# Patient Record
Sex: Male | Born: 1950 | Race: White | Hispanic: No | Marital: Married | Smoking: Never smoker
Health system: Southern US, Community
[De-identification: ages and names within clinical notes are randomized; demographics above are authoritative.]

## PROBLEM LIST (undated history)

## (undated) DIAGNOSIS — R569 Unspecified convulsions: Secondary | ICD-10-CM

## (undated) DIAGNOSIS — E7800 Pure hypercholesterolemia, unspecified: Secondary | ICD-10-CM

## (undated) DIAGNOSIS — C801 Malignant (primary) neoplasm, unspecified: Secondary | ICD-10-CM

## (undated) DIAGNOSIS — R609 Edema, unspecified: Secondary | ICD-10-CM

## (undated) DIAGNOSIS — R413 Other amnesia: Secondary | ICD-10-CM

## (undated) DIAGNOSIS — E039 Hypothyroidism, unspecified: Secondary | ICD-10-CM

## (undated) DIAGNOSIS — I480 Paroxysmal atrial fibrillation: Secondary | ICD-10-CM

## (undated) DIAGNOSIS — M4712 Other spondylosis with myelopathy, cervical region: Secondary | ICD-10-CM

## (undated) DIAGNOSIS — I4892 Unspecified atrial flutter: Secondary | ICD-10-CM

## (undated) DIAGNOSIS — I10 Essential (primary) hypertension: Secondary | ICD-10-CM

## (undated) DIAGNOSIS — N200 Calculus of kidney: Secondary | ICD-10-CM

## (undated) DIAGNOSIS — M1990 Unspecified osteoarthritis, unspecified site: Secondary | ICD-10-CM

## (undated) DIAGNOSIS — G4733 Obstructive sleep apnea (adult) (pediatric): Secondary | ICD-10-CM

## (undated) DIAGNOSIS — R21 Rash and other nonspecific skin eruption: Secondary | ICD-10-CM

## (undated) DIAGNOSIS — R269 Unspecified abnormalities of gait and mobility: Secondary | ICD-10-CM

## (undated) DIAGNOSIS — I5032 Chronic diastolic (congestive) heart failure: Secondary | ICD-10-CM

## (undated) DIAGNOSIS — R600 Localized edema: Secondary | ICD-10-CM

## (undated) DIAGNOSIS — G959 Disease of spinal cord, unspecified: Secondary | ICD-10-CM

## (undated) HISTORY — DX: Edema, unspecified: R60.9

## (undated) HISTORY — DX: Other spondylosis with myelopathy, cervical region: M47.12

## (undated) HISTORY — DX: Localized edema: R60.0

## (undated) HISTORY — DX: Paroxysmal atrial fibrillation: I48.0

## (undated) HISTORY — DX: Unspecified abnormalities of gait and mobility: R26.9

## (undated) HISTORY — DX: Other amnesia: R41.3

## (undated) HISTORY — DX: Disease of spinal cord, unspecified: G95.9

## (undated) HISTORY — PX: RETINAL DETACHMENT SURGERY: SHX105

## (undated) HISTORY — DX: Unspecified atrial flutter: I48.92

## (undated) HISTORY — PX: EYE SURGERY: SHX253

---
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
## 1989-07-23 HISTORY — PX: CATARACT EXTRACTION W/ INTRAOCULAR LENS  IMPLANT, BILATERAL: SHX1307

## 1998-12-08 ENCOUNTER — Emergency Department (HOSPITAL_COMMUNITY): Admission: EM | Admit: 1998-12-08 | Discharge: 1998-12-08 | Payer: Self-pay | Admitting: Emergency Medicine

## 1999-02-26 ENCOUNTER — Ambulatory Visit: Admission: RE | Admit: 1999-02-26 | Discharge: 1999-02-26 | Payer: Self-pay | Admitting: *Deleted

## 1999-03-16 ENCOUNTER — Emergency Department (HOSPITAL_COMMUNITY): Admission: EM | Admit: 1999-03-16 | Discharge: 1999-03-16 | Payer: Self-pay | Admitting: Emergency Medicine

## 1999-04-03 ENCOUNTER — Emergency Department (HOSPITAL_COMMUNITY): Admission: EM | Admit: 1999-04-03 | Discharge: 1999-04-03 | Payer: Self-pay | Admitting: Emergency Medicine

## 1999-04-25 ENCOUNTER — Inpatient Hospital Stay (HOSPITAL_COMMUNITY): Admission: EM | Admit: 1999-04-25 | Discharge: 1999-04-27 | Payer: Self-pay | Admitting: Internal Medicine

## 1999-07-29 ENCOUNTER — Emergency Department (HOSPITAL_COMMUNITY): Admission: EM | Admit: 1999-07-29 | Discharge: 1999-07-29 | Payer: Self-pay | Admitting: Emergency Medicine

## 1999-08-21 ENCOUNTER — Emergency Department (HOSPITAL_COMMUNITY): Admission: EM | Admit: 1999-08-21 | Discharge: 1999-08-21 | Payer: Self-pay | Admitting: *Deleted

## 1999-10-14 ENCOUNTER — Encounter: Payer: Self-pay | Admitting: Ophthalmology

## 1999-10-16 ENCOUNTER — Ambulatory Visit (HOSPITAL_COMMUNITY): Admission: RE | Admit: 1999-10-16 | Discharge: 1999-10-17 | Payer: Self-pay | Admitting: Ophthalmology

## 2000-07-29 ENCOUNTER — Ambulatory Visit (HOSPITAL_COMMUNITY): Admission: RE | Admit: 2000-07-29 | Discharge: 2000-07-29 | Payer: Self-pay | Admitting: Neurology

## 2000-11-22 ENCOUNTER — Emergency Department (HOSPITAL_COMMUNITY): Admission: EM | Admit: 2000-11-22 | Discharge: 2000-11-22 | Payer: Self-pay | Admitting: Emergency Medicine

## 2001-03-01 ENCOUNTER — Encounter: Payer: Self-pay | Admitting: Emergency Medicine

## 2001-03-01 ENCOUNTER — Emergency Department (HOSPITAL_COMMUNITY): Admission: EM | Admit: 2001-03-01 | Discharge: 2001-03-01 | Payer: Self-pay | Admitting: Emergency Medicine

## 2001-03-23 ENCOUNTER — Emergency Department (HOSPITAL_COMMUNITY): Admission: EM | Admit: 2001-03-23 | Discharge: 2001-03-23 | Payer: Self-pay

## 2001-04-07 ENCOUNTER — Inpatient Hospital Stay (HOSPITAL_COMMUNITY): Admission: EM | Admit: 2001-04-07 | Discharge: 2001-04-08 | Payer: Self-pay | Admitting: Emergency Medicine

## 2001-05-02 ENCOUNTER — Ambulatory Visit (HOSPITAL_COMMUNITY): Admission: RE | Admit: 2001-05-02 | Discharge: 2001-05-02 | Payer: Self-pay | Admitting: Cardiology

## 2001-05-02 ENCOUNTER — Encounter: Payer: Self-pay | Admitting: Cardiology

## 2001-05-11 ENCOUNTER — Emergency Department (HOSPITAL_COMMUNITY): Admission: EM | Admit: 2001-05-11 | Discharge: 2001-05-11 | Payer: Self-pay | Admitting: Emergency Medicine

## 2001-05-22 ENCOUNTER — Ambulatory Visit (HOSPITAL_BASED_OUTPATIENT_CLINIC_OR_DEPARTMENT_OTHER): Admission: RE | Admit: 2001-05-22 | Discharge: 2001-05-22 | Payer: Self-pay | Admitting: Pulmonary Disease

## 2001-08-29 ENCOUNTER — Emergency Department (HOSPITAL_COMMUNITY): Admission: EM | Admit: 2001-08-29 | Discharge: 2001-08-29 | Payer: Self-pay | Admitting: Emergency Medicine

## 2002-01-24 ENCOUNTER — Ambulatory Visit (HOSPITAL_BASED_OUTPATIENT_CLINIC_OR_DEPARTMENT_OTHER): Admission: RE | Admit: 2002-01-24 | Discharge: 2002-01-24 | Payer: Self-pay | Admitting: Pulmonary Disease

## 2002-02-15 ENCOUNTER — Ambulatory Visit (HOSPITAL_BASED_OUTPATIENT_CLINIC_OR_DEPARTMENT_OTHER): Admission: RE | Admit: 2002-02-15 | Discharge: 2002-02-15 | Payer: Self-pay | Admitting: Plastic Surgery

## 2002-02-15 ENCOUNTER — Encounter (INDEPENDENT_AMBULATORY_CARE_PROVIDER_SITE_OTHER): Payer: Self-pay | Admitting: Specialist

## 2003-04-06 ENCOUNTER — Emergency Department (HOSPITAL_COMMUNITY): Admission: EM | Admit: 2003-04-06 | Discharge: 2003-04-06 | Payer: Self-pay

## 2003-08-23 ENCOUNTER — Ambulatory Visit (HOSPITAL_COMMUNITY): Admission: RE | Admit: 2003-08-23 | Discharge: 2003-08-23 | Payer: Self-pay | Admitting: Gastroenterology

## 2004-07-15 ENCOUNTER — Ambulatory Visit (HOSPITAL_BASED_OUTPATIENT_CLINIC_OR_DEPARTMENT_OTHER): Admission: RE | Admit: 2004-07-15 | Discharge: 2004-07-15 | Payer: Self-pay | Admitting: Family Medicine

## 2004-08-14 ENCOUNTER — Ambulatory Visit (HOSPITAL_BASED_OUTPATIENT_CLINIC_OR_DEPARTMENT_OTHER): Admission: RE | Admit: 2004-08-14 | Discharge: 2004-08-14 | Payer: Self-pay | Admitting: Family Medicine

## 2008-05-13 ENCOUNTER — Emergency Department (HOSPITAL_COMMUNITY): Admission: EM | Admit: 2008-05-13 | Discharge: 2008-05-13 | Payer: Self-pay | Admitting: Emergency Medicine

## 2009-05-08 ENCOUNTER — Emergency Department (HOSPITAL_COMMUNITY): Admission: EM | Admit: 2009-05-08 | Discharge: 2009-05-08 | Payer: Self-pay | Admitting: Emergency Medicine

## 2009-06-26 ENCOUNTER — Ambulatory Visit (HOSPITAL_COMMUNITY): Admission: RE | Admit: 2009-06-26 | Discharge: 2009-06-26 | Payer: Self-pay | Admitting: Orthopedic Surgery

## 2009-07-29 ENCOUNTER — Encounter: Admission: RE | Admit: 2009-07-29 | Discharge: 2009-07-29 | Payer: Self-pay | Admitting: Neurosurgery

## 2009-08-11 ENCOUNTER — Encounter: Admission: RE | Admit: 2009-08-11 | Discharge: 2009-08-11 | Payer: Self-pay | Admitting: Neurosurgery

## 2009-10-02 ENCOUNTER — Encounter: Admission: RE | Admit: 2009-10-02 | Discharge: 2009-11-20 | Payer: Self-pay | Admitting: Neurosurgery

## 2010-10-07 ENCOUNTER — Encounter: Admission: RE | Admit: 2010-10-07 | Discharge: 2010-10-07 | Payer: Self-pay | Admitting: Neurology

## 2010-12-13 ENCOUNTER — Encounter: Payer: Self-pay | Admitting: Orthopedic Surgery

## 2011-03-18 ENCOUNTER — Other Ambulatory Visit (HOSPITAL_COMMUNITY): Payer: Self-pay | Admitting: Neurology

## 2011-03-18 DIAGNOSIS — M47812 Spondylosis without myelopathy or radiculopathy, cervical region: Secondary | ICD-10-CM

## 2011-03-23 ENCOUNTER — Ambulatory Visit (HOSPITAL_COMMUNITY)
Admission: RE | Admit: 2011-03-23 | Discharge: 2011-03-23 | Disposition: A | Discharge status: Home / Self Care | Payer: 59 | Source: Ambulatory Visit | Attending: Neurology | Admitting: Neurology

## 2011-03-23 DIAGNOSIS — M47812 Spondylosis without myelopathy or radiculopathy, cervical region: Secondary | ICD-10-CM | POA: Insufficient documentation

## 2011-03-23 DIAGNOSIS — M79609 Pain in unspecified limb: Secondary | ICD-10-CM | POA: Insufficient documentation

## 2011-03-23 DIAGNOSIS — Q762 Congenital spondylolisthesis: Secondary | ICD-10-CM | POA: Insufficient documentation

## 2011-03-23 DIAGNOSIS — M5020 Other cervical disc displacement, unspecified cervical region: Secondary | ICD-10-CM | POA: Insufficient documentation

## 2011-03-23 DIAGNOSIS — R209 Unspecified disturbances of skin sensation: Secondary | ICD-10-CM | POA: Insufficient documentation

## 2011-03-23 DIAGNOSIS — M129 Arthropathy, unspecified: Secondary | ICD-10-CM | POA: Insufficient documentation

## 2011-03-23 DIAGNOSIS — M542 Cervicalgia: Secondary | ICD-10-CM | POA: Insufficient documentation

## 2011-03-23 HISTORY — PX: CERVICAL DISCECTOMY: SHX98

## 2011-03-30 ENCOUNTER — Encounter (HOSPITAL_COMMUNITY)
Admission: RE | Admit: 2011-03-30 | Discharge: 2011-03-30 | Disposition: A | Discharge status: Home / Self Care | Payer: 59 | Source: Ambulatory Visit | Attending: Neurosurgery | Admitting: Neurosurgery

## 2011-03-30 ENCOUNTER — Ambulatory Visit (HOSPITAL_COMMUNITY)
Admission: RE | Admit: 2011-03-30 | Discharge: 2011-03-30 | Disposition: A | Discharge status: Home / Self Care | Payer: 59 | Source: Ambulatory Visit | Attending: Neurosurgery | Admitting: Neurosurgery

## 2011-03-30 ENCOUNTER — Other Ambulatory Visit (HOSPITAL_COMMUNITY): Payer: Self-pay | Admitting: Neurosurgery

## 2011-03-30 DIAGNOSIS — M4802 Spinal stenosis, cervical region: Secondary | ICD-10-CM

## 2011-03-30 DIAGNOSIS — Z01812 Encounter for preprocedural laboratory examination: Secondary | ICD-10-CM | POA: Insufficient documentation

## 2011-03-30 DIAGNOSIS — Z01811 Encounter for preprocedural respiratory examination: Secondary | ICD-10-CM | POA: Insufficient documentation

## 2011-03-30 DIAGNOSIS — Z01810 Encounter for preprocedural cardiovascular examination: Secondary | ICD-10-CM | POA: Insufficient documentation

## 2011-03-30 DIAGNOSIS — Z01818 Encounter for other preprocedural examination: Secondary | ICD-10-CM | POA: Insufficient documentation

## 2011-03-30 DIAGNOSIS — I10 Essential (primary) hypertension: Secondary | ICD-10-CM | POA: Insufficient documentation

## 2011-03-30 DIAGNOSIS — G4730 Sleep apnea, unspecified: Secondary | ICD-10-CM | POA: Insufficient documentation

## 2011-03-30 LAB — CBC
HCT: 41.9 % (ref 39.0–52.0)
Hemoglobin: 14.1 g/dL (ref 13.0–17.0)
MCH: 30.1 pg (ref 26.0–34.0)
MCHC: 33.7 g/dL (ref 30.0–36.0)
MCV: 89.3 fL (ref 78.0–100.0)
Platelets: 172 10*3/uL (ref 150–400)
RBC: 4.69 MIL/uL (ref 4.22–5.81)
RDW: 13.4 % (ref 11.5–15.5)
WBC: 4.3 10*3/uL (ref 4.0–10.5)

## 2011-03-30 LAB — BASIC METABOLIC PANEL
BUN: 10 mg/dL (ref 6–23)
CO2: 30 mEq/L (ref 19–32)
Calcium: 10 mg/dL (ref 8.4–10.5)
Chloride: 103 mEq/L (ref 96–112)
Creatinine, Ser: 0.6 mg/dL (ref 0.4–1.5)
GFR calc Af Amer: >60 mL/min (ref >60)
GFR calc non Af Amer: >60 mL/min (ref >60)
Glucose, Bld: 80 mg/dL (ref 70–99)
Potassium: 4 mEq/L (ref 3.5–5.1)
Sodium: 140 mEq/L (ref 135–145)

## 2011-03-30 LAB — DIFFERENTIAL
Basophils Absolute: 0 10*3/uL (ref 0.0–0.1)
Basophils Relative: 1 % (ref 0–1)
Eosinophils Absolute: 0.2 10*3/uL (ref 0.0–0.7)
Eosinophils Relative: 4 % (ref 0–5)
Lymphocytes Relative: 33 % (ref 12–46)
Lymphs Abs: 1.4 10*3/uL (ref 0.7–4.0)
Monocytes Absolute: 0.6 10*3/uL (ref 0.1–1.0)
Monocytes Relative: 13 % — ABNORMAL HIGH (ref 3–12)
Neutro Abs: 2.1 10*3/uL (ref 1.7–7.7)
Neutrophils Relative %: 49 % (ref 43–77)

## 2011-03-30 LAB — SURGICAL PCR SCREEN
MRSA, PCR: NEGATIVE
Staphylococcus aureus: NEGATIVE

## 2011-03-31 ENCOUNTER — Observation Stay (HOSPITAL_COMMUNITY)
Admission: RE | Admit: 2011-03-31 | Discharge: 2011-04-02 | Disposition: A | Discharge status: Home / Self Care | Payer: 59 | Source: Ambulatory Visit | Attending: Neurosurgery | Admitting: Neurosurgery

## 2011-03-31 ENCOUNTER — Inpatient Hospital Stay (HOSPITAL_COMMUNITY): Payer: 59

## 2011-03-31 DIAGNOSIS — G8250 Quadriplegia, unspecified: Secondary | ICD-10-CM | POA: Insufficient documentation

## 2011-03-31 DIAGNOSIS — M4712 Other spondylosis with myelopathy, cervical region: Principal | ICD-10-CM | POA: Insufficient documentation

## 2011-03-31 DIAGNOSIS — Z01812 Encounter for preprocedural laboratory examination: Secondary | ICD-10-CM | POA: Insufficient documentation

## 2011-03-31 DIAGNOSIS — Z01810 Encounter for preprocedural cardiovascular examination: Secondary | ICD-10-CM | POA: Insufficient documentation

## 2011-03-31 DIAGNOSIS — Z01818 Encounter for other preprocedural examination: Secondary | ICD-10-CM | POA: Insufficient documentation

## 2011-04-03 NOTE — Op Note (Signed)
[name], [name] NO.:  [id number]  MEDICAL RECORD NO.:  [id number]           PATIENT TYPE:  O  LOCATION:  3523                         FACILITY:  [facility]  PHYSICIAN:  Hewitt Shorts, M.D.DATE OF BIRTH:  1951/10/04  DATE OF PROCEDURE: DATE OF DISCHARGE:                              OPERATIVE REPORT   PREOPERATIVE DIAGNOSES:  Cervical stenosis, cervical myelopathy, quadriparesis, and cervical dynamic instability.  POSTOPERATIVE DIAGNOSES:  Cervical stenosis, cervical myelopathy, quadriparesis, and cervical dynamic instability.  PROCEDURES:  C3-C5 posterior cervical arthrodesis with lateral mass screws and rods, and locally harvested morselized autograft and morselized allograft, and C3-C5 cervical laminectomy with microdissection, microsurgical technique with operating microscope.  SURGEON:  Hewitt Shorts, MD  ASSISTANT:  Lovell Sheehan.  ANESTHESIA:  General endotracheal.  INDICATIONS:  The patient is a 60 year old man with history of lumbar stenosis, seizure disorder, who developed a progressive spastic quadriparesis, was found to have cervical stenosis at C3-4, C4-5 levels, primarily due to a retrolisthesis C3 and C4 and in fact the listhesis was dynamic to flexion and extension.  Compression was primarily from the posterior elements of C4, and therefore it was elected to proceed with a posterior decompression and stabilization with the understanding that possible need for eventual anterior cervical diskectomy and arthrodesis.  DESCRIPTION OF PROCEDURE:  The patient was brought to the operating room and placed under general endotracheal anesthesia.  The patient was placed in a 3-pin Mayfield head holder and turned to a prone position. The posterior aspect of the head and neck and upper back were prepped with Betadine soap and solution, and draped in a sterile fashion.  The midline was infiltrated with local anesthetic with epinephrine, and  a midline incision was made, carried down through the subcutaneous tissue. Bipolar electrocautery was used to maintain hemostasis.  Dissection was carried down to the cervical fascia, which was incised in the midline, and the paracervical musculature was dissected from the spinous process and lamina in subperiosteal fashion.  The spinous process C2, C3, C4, and C5 were exposed along with the lamina.  The C-arm fluoroscope was draped and brought in the field, and the localization confirmed. Dissection was then carried out laterally over the facet complexes.  It was advanced to facet arthropathy, C3-4 worse than C4-5.  Using a C-arm fluoroscope, we identified entry points for the lateral masses, selecting an inferomedial to superolateral approach.  Each screw hole was drilled, examined with a ball probe.  Good bony surfaces were found. Each screw hole was tapped.  We placed a 3.5 x 12-mm screws bilaterally at C5 and C4, and on the right side at C3 and a 14-mm screw on the left- sided C3.  Once the screws were all in place, we cut rods to a proper length.  The rod on the left had a gentle lordosis placed, and then we placed six locking caps.  Once the locking caps were in place, final tightening was done.  The system used was Viewpoint from [employer].  The operating microscope was then draped and brought in the field, and we proceeded with the decompression using microdissection microsurgical technique.  Laminectomy  was performed.  The entire spinous process and lamina of C3 and C4 and the superior lamina and spinous process C5.  The spinous process and lamina were harvested for bone grafting used as a locally harvested morselized autograft later during the arthrodesis.  We then thinned the remaining lamina with the high-speed drill and the remaining lamina was then removed using a 3-mm Kerrison punch with a thin footplate.  Ligamentum flavum was carefully removed and we decompressed the  thecal sac from the C2-3 interlaminar space to the upper portion of C5.  The decompression extended laterally with good decompression of thecal sac was achieved.  Edges of the bone were waxed as needed with bone wax and good hemostasis was established confirmed. We then packed the lateral masses with combination of locally harvested morselized autograft and Vitoss.  The surfaces have been previously decorticated with high-speed drill.  We then proceeded with closure.  The paraspinal muscles were approximated with interrupted undyed #1 Vicryl sutures, deep fascia with interrupted undyed #1 Vicryl sutures, Scarpa fascia closed with interrupted inverted undyed #1 Vicryl and 2-0 undyed Vicryl sutures, and the skin was approximated with interrupted inverted 2-0 Vicryl sutures along with Dermabond.  Once the Dermabond was dry, sterile gauze was applied and 4-inch Hypafix.  Procedure was tolerated well.  Following the procedure, the patient was turned back to the supine position, the 3- pin Mayfield head holder was removed.  An Aspen cervical collar was applied and the patient was to be reversed from the anesthetic, extubated, and transferred to the recovery room for further care. Estimated blood loss was 100 mL.  Sponge count correct.     Hewitt Shorts, M.D.     RWN/MEDQ  D:  03/31/2011  T:  04/01/2011  Job:  [id number]  Electronically Signed by Shirlean Kelly M.D. on 04/03/2011 11:03:56 AM

## 2011-04-09 NOTE — H&P (Signed)
[place]  Patient:    [name], [name]                    MRN: [id number] Adm. Date:  [id number] Disc. Date: [id number] Attending:  Anastasio Auerbach CC:         Henrine Screws, M.D., [facility]  Marlan Palau, M.D.  Gerrit Friends. Dietrich Pates, M.D. [facility]   History and Physical  DATE OF BIRTH: November 24, 1950  PROBLEM LIST:  1. Signs and symptoms complex.     a. Diaphoresis, light headedness, mid abdominal fluttering feeling        secondary to hypoglycemia.  2. Lower extremity edema, questionable right-sided heart failure.  3. Uncontrolled hypertension.  4. Possible obesity sleep apnea.  5. Recent history of near syncope, with similar symptoms to problem #1,     with abnormal CK-MB.  6. Seizure disorder, with multiple emergency room visits associated with     this problem.  7. Obesity.  8. History of right eye retinal detachment x 4, last in 2000.  CHIEF COMPLAINT: Light headedness, diaphoresis.  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 The patient was working when these symptoms started.  The patient denies shortness of breath or chest pain.  He had similar symptoms, with almost a near syncopal episode about two weeks ago.  He was evaluated then in the emergency department.  He had an unchanged EKG without evidence of acute findings.  His CK-MBs were abnormal, with CK of 131 and MB of 8.6 - which is about 6.3% of fraction percentage.  A glucose level was normal.  Oxygen saturation was 91% on room air at that time.  Today when the patient developed symptoms an Accu-Chek was 49.  He was transferred to the emergency department, where he received glucose tablets on two  occasions.  The CBGs improved up to 80 and then 105.  The symptoms resolved with resolution of this hypoglycemia.  The patient denies fever, chills, recent upper respiratory symptoms, orthopnea, melena, tarry stools, bright red blood per rectum, skin rash, headaches, palpitations, skipped beats feeling, focal weakness, seizure activity, bowel or urine incontinence.  He describes chronic lower extremity edema.  He describes a history of elevated blood pressure, treated with two antihypertensive agents.  Dr. Janey Greaser stopped these two medications about five years ago.  The patient denies swallowing problems, focal weakness, amaurosis fugax, slurred speech, urinary symptoms, diarrhea, constipation, nausea or vomiting.  Mr. Tursi describes how this morning he ate a little amount of breakfast as well as for lunch and ate a fare of the usual amount of food.  He instead drank several cups of coffee due to stress at work.  PAST MEDICAL HISTORY: As per problem list.  ALLERGIES: None.  CURRENT MEDICATIONS:  1. Dilantin 300 mg in the morning, 350 mg in the afternoon.  2. Phenobarbital 120 mg p.o. q.d.  3. Multivitamin one tablet p.o. q.d.  SOCIAL HISTORY: The patient is married, no children.  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  He does not drink alcohol, he does not smoke, he does not use illegal drugs.  FAMILY HISTORY: His grandmother had diabetes.  His mother had hypertension. His mother and grandmother had strokes.  His mother had an MI in the late 26s. His father had a laryngeal cancer as well as a left eye cancer that required enucleation of the left eye.  His father also had a melanoma removed.  ALLERGIES: None.  REVIEW OF SYSTEMS: As per HPI.  PHYSICAL EXAMINATION:  VITAL SIGNS: Temperature 99.1 degrees, blood pressure 154/86, heart rate 93, respirations 20.  Oxygen saturation 95% on room air.  HEENT: Head normocephalic,  atraumatic.  Sclerae nonicteric.  Right conjunctiva slightly injected - this has been a chronic finding.  PERRLA.  EOMI. Funduscopic examination shows no definitive papilledema or hemorrhages. TMs within normal limits.  Moist mucous membranes.  Oropharynx clear.  NECK: Supple.  No JVD, no bruits, no adenopathy, not thyromegaly.  LUNGS: Clear to auscultation bilaterally without crackles, wheezes.  Fair air movement bilaterally.  CARDIAC: Regular rate and rhythm without murmurs, rubs, or gallops.  Normal S1 and S2.  ABDOMEN: Nontender, nondistended, obese; no hepatosplenomegaly; bowel sounds present; no masses, no bruits, no rebound or guarding.  GU: Examination within normal limits.  RECTAL: Examination not done.  EXTREMITIES: Pitting edema 2+ bilaterally up to the mid tibial shafts.  Pulses 1+ bilaterally.  No clubbing or cyanosis.  NEUROLOGIC: Alert and oriented x 3.  Cranial nerves 2-12 intact.  Strength 5/5 in all extremities.  DTRs 3/5 in all extremities.  Sensory intact.  Plantar reflexes downgoing bilaterally.  LABORATORY DATA: Urine drug screen negative.  Dilantin level 14.4, phenobarbital level 16.2.  Hemoglobin 15.3, MCV 89; WBC 7.1; platelets 216,000.  Sodium 138, potassium 4.2, chloride 108, CO2 23, BUN 9, creatinine 0.8, glucose 104 after glucose tablets.  LFTs within normal limits except for alkaline phosphatase up to 137.  Troponin I 0.03.  CK 63, MB 3.7.  EKG shows normal sinus rhythm with normal axis, no significant ST segment changes; R waves in V2 lost compared to previous EKG on Mar 23, 2001; otherwise, no significant changes.  ASSESSMENT/PLAN:  1. Signs and symptoms complex (as described in problem list).  Hypoglycemia     seems to be the most likely etiology to explain the patients diaphoresis     and light headedness associated with fluttering feeling of the mid      abdomen.  The symptoms have resolved completely with glucose tablets.     Despite  slight electrocardiogram change since Mar 23, 2001, the patient     denies any chest pain, shortness of breath, orthopnea, or dyspnea on     exertion.  Will admit the patient to a telemetry bed.  Accu-Cheks will be     monitored closely.  Given that no hypoglycemia had been noticed on     multiple emergency room visits or admissions, secondary etiology like     insulin normal is very unlikely.  I think that the fact that the patient     did not eat well for breakfast and lunch today, associated with stress     related to work load, hypoglycemia took place today.  Once again, we will     monitor the patients CBGs closely and decide about further work-up.  2. Lower extremity edema.  The patient describes this lower extremity     swelling for many years.  Given that he describes clear symptoms of sleep     apnea, cor pulmonale is possible.  For now we will monitor overnight the     pulse oximetry and determine if he requires nocturnal home oxygen     supplementation.  A  2D echocardiogram will be obtained either during this     hospital stay or at the [facility] office to evaluate right-sided     pressures.  3. Uncontrolled hypertension.  As described in the History of Present     Illness, two antihypertensive agents were discontinued about five years     ago.  Hypertension is present currently.  Will start on an ACE inhibitor     and a beta-blocker for heart protection, and will follow the patients     blood pressure response.  4. Abnormal CK-MB two weeks ago, associated with electrocardiogram change.     As described in the History of Present Illness, two weeks ago the patient     had abnormal cardiac enzymes.  Troponin I was not obtained at that time.     Today cardiac enzymes are completely negative.  Electrocardiogram compared     to two weeks ago shows a loss of R wave in V2.  Questionable Q wave could     be present in V1, V2 through there is a small R wave defection.  If Q  wave     were to be confirmed an anterior myocardial infarction could have taken     place two weeks ago.  For this reason, the patient is being admitted to a     telemetry bed.  Electrocardiogram series as well as cardiac enzyme series     will be obtained.  Aspirin and beta-blocker will be started for heart     protection.  Dr. Dietrich Pates will see the patient in consultation.  If the     patient is discharged home over the weekend a Cardiolite will be arranged     at the [facility] office.  If Dr. Dietrich Pates decides that this     patient requires inpatient work-up, then the patient will be kept until     Monday for further cardiac evaluation.  Also, a 2D echocardiogram should     help to evaluate the patients left ventricular function.  If, indeed, an     anterior wall myocardial infarction were to occur the left ventricle wall     should be effected by this questionable event.  5. Possible obesity sleep apnea.  The patient describes falling asleep easily     at work.  His wife has described heavy snoring, with times in which he     stops breathing.  These clinical symptoms associated with clinical obesity     and thick neck are suspicious for obesity sleep apnea.  Prior to discharge     an outpatient sleep study will be obtained.  As described in problem #2,     nocturnal pulse oximetry will be monitored. DD:  04/07/01 TD:  04/09/01 Job: [id number]

## 2011-04-09 NOTE — Procedures (Signed)
NAMEBRODYN, DEPUY              ACCOUNT NO.:  [id number]   MEDICAL RECORD NO.:  [id number]          PATIENT TYPE:  OUT   LOCATION:  SLEEP CENTER                 FACILITY:  [facility]   PHYSICIAN:  Clinton D. Maple Hudson, M.D. DATE OF BIRTH:  [birth year]   DATE OF STUDY:  08/14/2004  DATE OF DISCHARGE:  08/14/2004                              NOCTURNAL POLYSOMNOGRAM   REFERRING PHYSICIAN:  Dr. Henrine Screws   INDICATION FOR THE STUDY:  Hypersomnia with sleep apnea.  Diagnostic NPSG on  July 15, 2004 recorded an RDI of 11.7/hr with desaturation to 76%.  He is  currently using home CPAP at 12 CWP.   EPWORTH SLEEPINESS SCORE:  10/24   NECK SIZE:  17-1/2 inches   BODY MASS INDEX:  43   WEIGHT:  322 pounds   MEDICATIONS:  Dilantin, phenobarbital, Synthroid, metoprolol, aspirin,  Lotensin, Lipitor, Relafen.   SLEEP ARCHITECTURE:  Total sleep time 337 minutes with sleep efficiency 79%,  stage I was 5%, stage II 36%, stages III and IV 30%, REM was 28% of total  sleep time.  Latency to sleep onset 34 minutes, REM latency 210 minutes.  Awake after sleep onset 56 minutes.  Arousal index 9.8.   RESPIRATORY DATA:  CPAP titration protocol.  CPAP was titrated to 11 CWP RDI  0.5/hr using a full face ComfortGel Mask, size not specified.  His own mask  from home was a [facility] but technician noted oral venting with this  nasal mask and changed to a full face mask.   OXYGEN DATA:  Moderate snoring before CPAP control with a nadir of 85%,  improved to 94% or better after CPAP control on room air.   CARDIAC DATA:  Normal sinus rhythm.   MOVEMENT/PARASOMNIA:  A total of 362 limb jerks were recorded of which 7  were associated with arousal or awakening for a periodic limb movement with  arousal index of 1.2/hr which is probably not significant.   IMPRESSION/RECOMMENDATION:  Successful continuous positive airway pressure  titration to 11 CWP, respiratory disturbance index 0.5/hr using a full  face  ComfortGel Mask.  His home continuous positive airway  pressure of 12 CWP would be acceptable if he is comfortable with it but  would lead to increased oral venting with his current nasal mask.      CDY/MEDQ  D:  08/23/2004 12:29:50  T:  08/24/2004 08:06:17  Job:  [id number]

## 2011-04-09 NOTE — Procedures (Signed)
NAME:  [name], [name]           ACCOUNT NO.:  [id number]   MEDICAL RECORD NO.:  [id number]          PATIENT TYPE:  OUT   LOCATION:  SLEEP CENTER                 [facility]ACILITY:  [facility]   PHYSICIAN:  Clinton D. Maple Hudson, M.D. DATE OF BIRTH:  17-Mar-1951   DATE OF ADMISSION:  07/15/2004  DATE OF DISCHARGE:                              NOCTURNAL POLYSOMNOGRAM   REFERRING PHYSICIAN:  Dr. Henrine Screws   INDICATION FOR STUDY:  Hypersomnia with sleep apnea.  Prior diagnosis of  obstructive sleep apnea.  Intermittent home use of CPAP at 12 CWP reported.  The patient was scheduled for a split study protocol on this night.   MEDICATION LIST:  Dilantin, phenobarbital, Lipitor, aspirin, metoprolol,  Synthroid, Lotensin.   MEDICAL PROBLEM LIST:  Detached retinas, epilepsy, asthma, obesity,  hypertension, hyperlipidemia, osteoarthritis.   EPWORTH SLEEPINESS SCORE:  14/24.   BMI 42.  Weight 321 pounds.   SLEEP ARCHITECTURE:  Total sleep time 374 minutes with sleep efficiency 79%.  Stage I was 4%, Stage II 63%, Stages III and IV were absent, REM was 11% of  total sleep time. Latency to sleep onset 17 minutes, latency to REM 339  minutes, awake after sleep onset 85 minutes, arousal index 38/hr.   RESPIRATORY DATA:  Mild obstructive sleep apnea/hypopnea syndrome, RDI  11.7/hr reflecting 73 hypopneas.  Events were not positional. REM RDI was  35/hr. He did not qualify for split study protocol because of difficulty  maintaining sleep and infrequent events during the first half of the night,  not allowing sufficient time for CPAP titration.   OXYGEN DATA:  Moderate to loud snoring with moderate oxygen desaturation to  a nadir of 76% with apneas.  Baseline oxygen saturation on room air at rest  while awake was 94%.   CARDIAC DATA:  Normal cardiac rhythm.   MOVEMENT/PARASOMNIA:  Frequent limb jerks, a total of 287 recorded of which  60 were associated with arousal or awakening for a periodic limb  movement  with arousal index of 9.6/hr.  He did awaken complaining of leg pain.   IMPRESSION/RECOMMENDATION:  Moderately severe obstructive sleep  apnea/hypopnea syndrome, respiratory disturbance index 11.7/hr with moderate  oxygen desaturation to 78%.  CPAP titration was not attempted by split study  protocol because of insufficient events early in the study, to allow  adequate time.  If CPAP titration is specifically required, he can return  for this.  There is history of epilepsy, but no epileptiform activity was  noted on EEG on this study.  Periodic limb movement syndrome, moderate, 9.6  arousals per hour may justify consideration of additional therapy with  clonazepam, Requip, or alternative if clinically appropriate.                                   ______________________________                                Rennis Chris. Maple Hudson, M.D.  Diplomate, American Board of Sleep Medicine    CDY/MEDQ  D:  07/19/2004 12:00:02  T:  07/20/2004 13:18:56  Job:  [id number]

## 2011-07-06 IMAGING — CT CT L SPINE W/O CM
3 series · 16 of 33 positions shown, 19 images · non-contrast
Comparison: None

CLINICAL DATA: Low back pain and bilateral leg pain, left greater
than right.

CT LUMBAR SPINE WITHOUT CONTRAST
TECHNIQUE: Multidetector CT imaging of the lumbar spine was
performed without intravenous contrast administration. Multiplanar
CT image reconstructions were also generated.

[Series 4: 2mm axial soft tissue · axial · 0.37mm/px · z∈[-190,-10]mm · 8 of 108 slices shown, 10 images]
[im 9/108  soft-tissue]
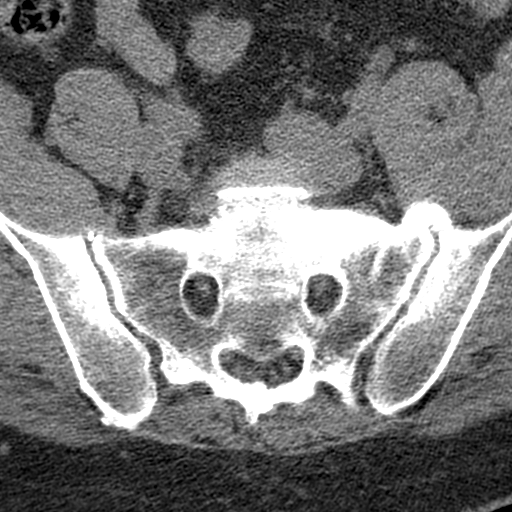
[im 9/108  bone]
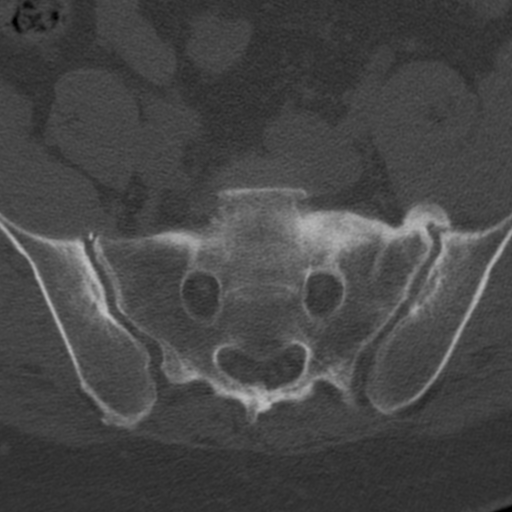
[im 25/108  bone]
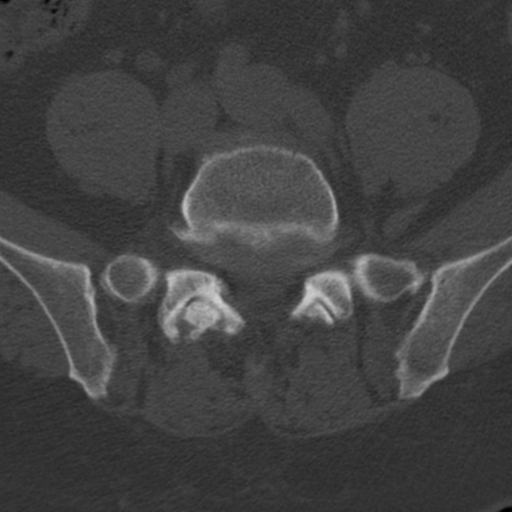
[im 33/108  bone]
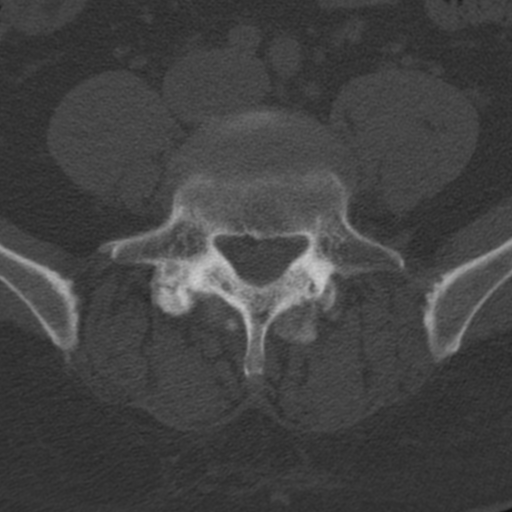
[im 50/108  bone]
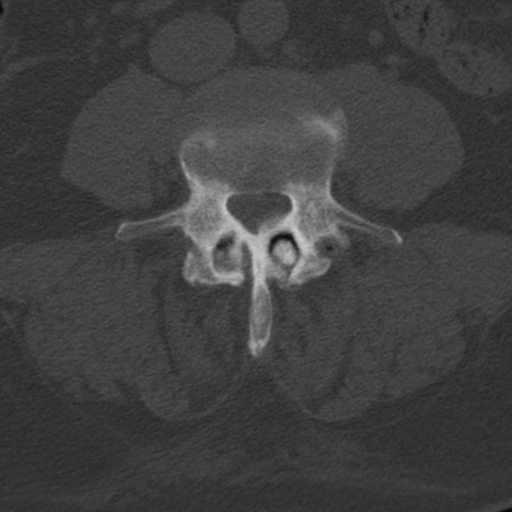
[im 58/108  soft-tissue]
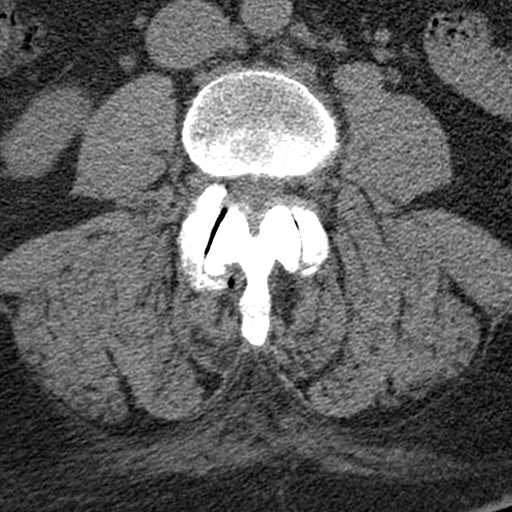
[im 58/108  bone]
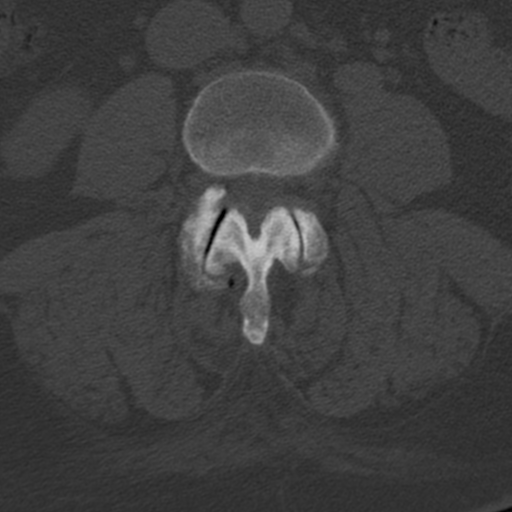
[im 75/108  bone]
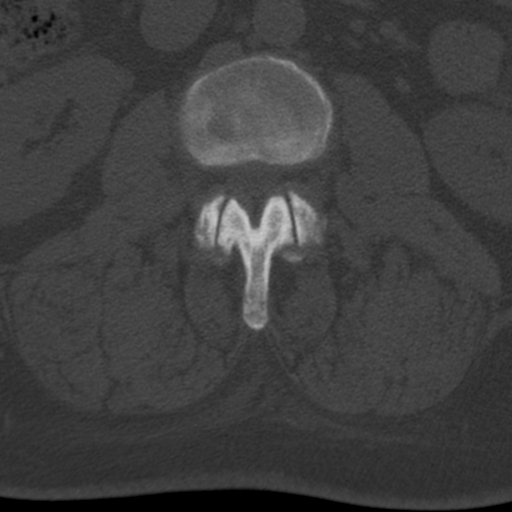
[im 83/108  bone]
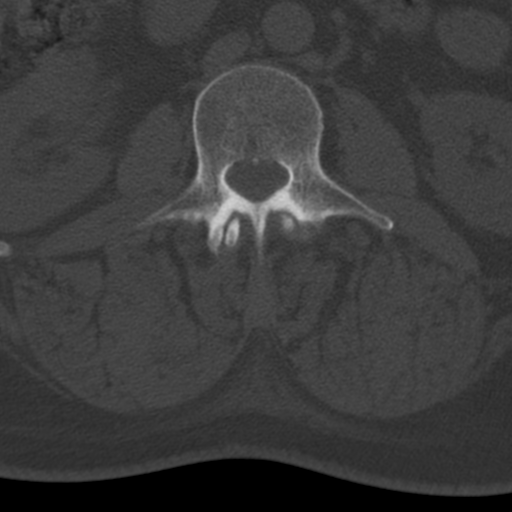
[im 99/108  bone]
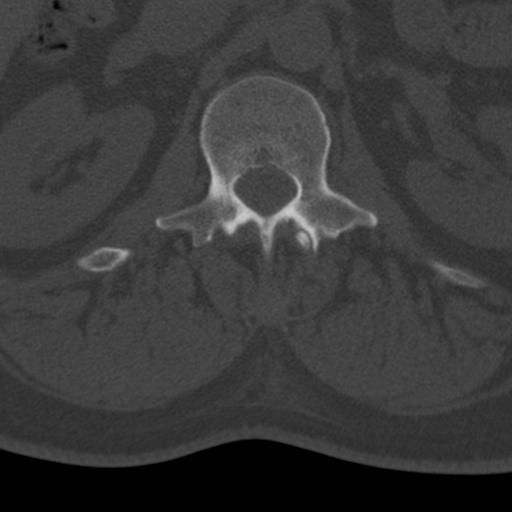

[Series 602: sagittal l-spine-bone · sagittal · 0.43mm/px · 5 of 48 slices shown, 6 images]
[im 16/48  bone]
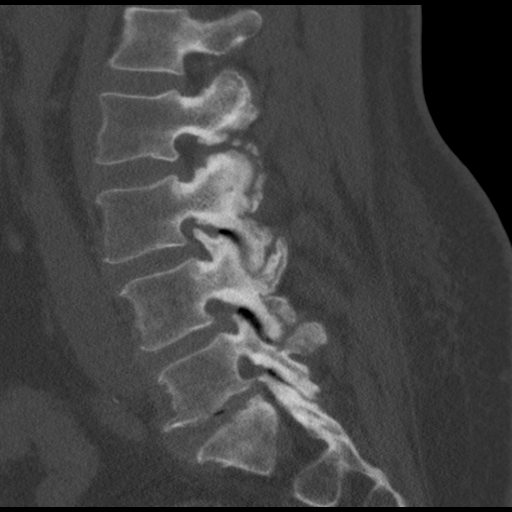
[im 20/48  bone]
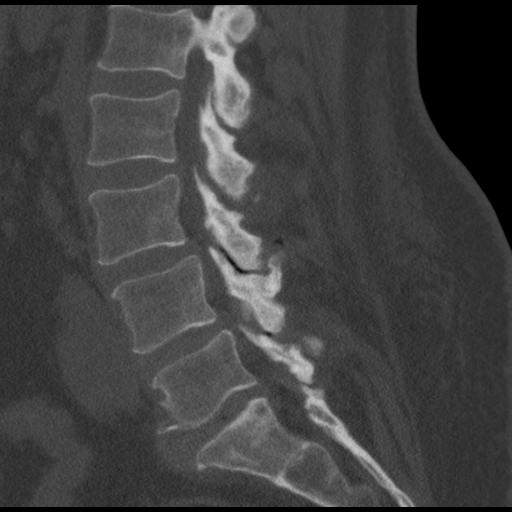
[im 24/48  soft-tissue]
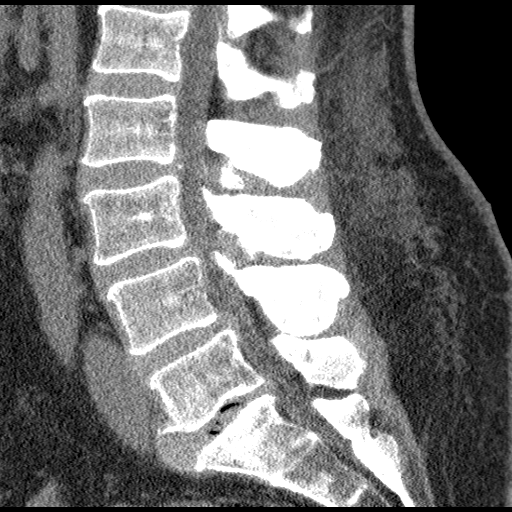
[im 24/48  bone]
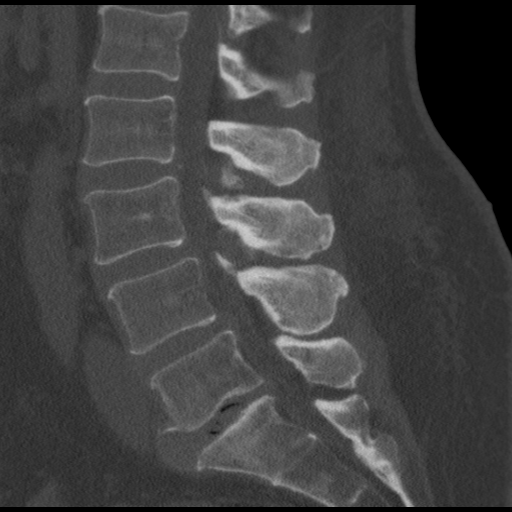
[im 28/48  bone]
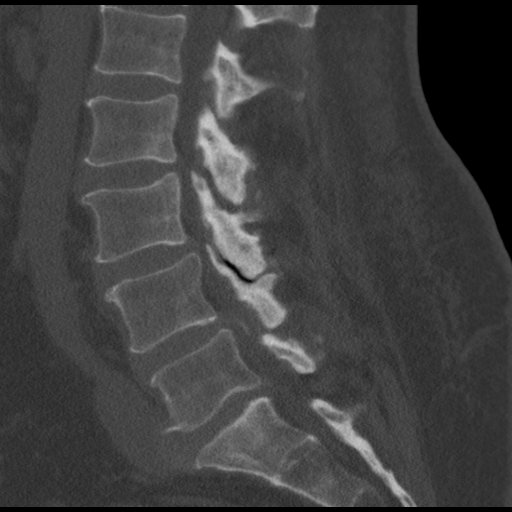
[im 32/48  bone]
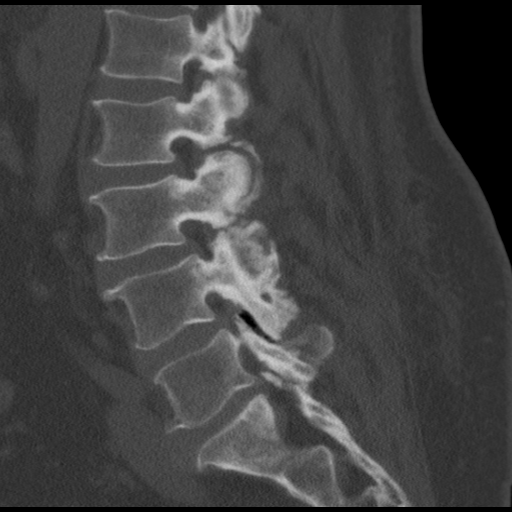

[Series 603: coronal l-spine-bone · coronal · 0.43mm/px · 3 of 63 slices shown]
[im 13/63  bone]
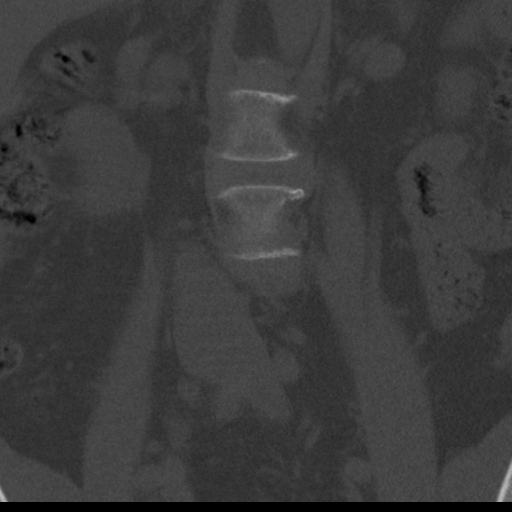
[im 25/63  bone]
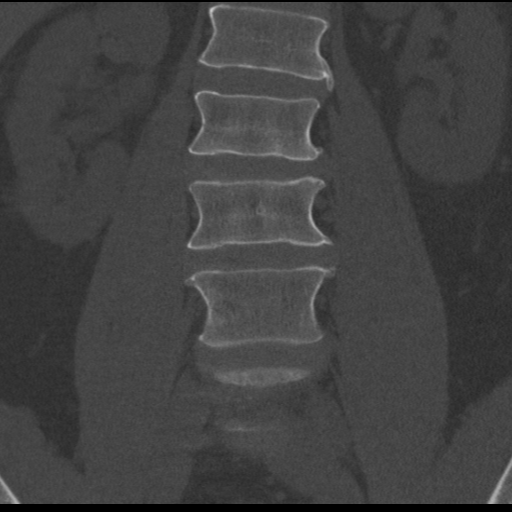
[im 38/63  bone]
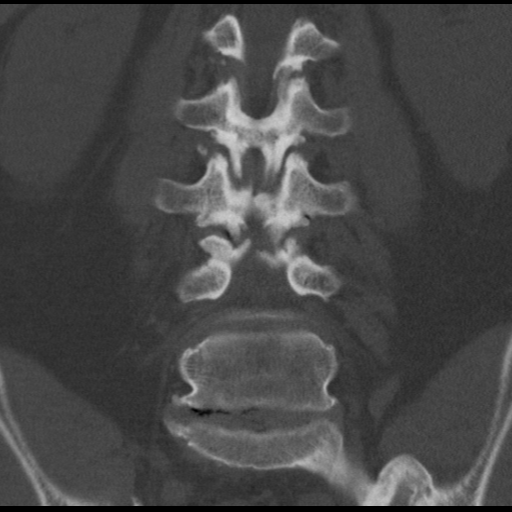

[16 of 33 positions shown; findings below may reference images not displayed]

FINDINGS: The sagittal reformatted images demonstrate normal
alignment of the lumbar vertebral bodies.  Mild to moderate
degenerative disc disease at L5-S1.  There is severe multilevel
facet disease.  No definite pars defects.  The last full
intervertebral disc space is labeled L5-S1.

L1-2:  No significant findings.  Moderate facet disease.

L2-3:  Diffuse annular bulge in combination with hypertrophic facet
disease and short pedicles creates moderately severe spinal
stenosis and bilateral lateral recess stenosis.  No significant
foraminal stenosis.

L3-4:  Broad-based bulging degenerated annulus, short pedicles and
severe facet disease contribute to severe spinal and lateral recess
stenosis.  There is also mild foraminal stenosis bilaterally.

L4-5:  Broad-based disc protrusion in combination with severe facet
disease creates severe spinal and lateral recess stenosis.
Moderate foraminal stenosis bilaterally also.

L5-S1:  Broad-based disc protrusion.  There is mass effect on the
thecal sac and this could involve both S1 nerve roots. Advanced
facet disease.
IMPRESSION: 1.  Degenerative lumbar spondylosis with disc disease and severe
facet disease.
2.  Multilevel multifactorial spinal, lateral recess and foraminal
stenosis as specifically discussed above at the individual levels.

## 2011-08-16 ENCOUNTER — Emergency Department (HOSPITAL_COMMUNITY)
Admission: EM | Admit: 2011-08-16 | Discharge: 2011-08-16 | Disposition: A | Discharge status: Home / Self Care | Payer: 59 | Attending: Emergency Medicine | Admitting: Emergency Medicine

## 2011-08-16 DIAGNOSIS — G40909 Epilepsy, unspecified, not intractable, without status epilepticus: Secondary | ICD-10-CM | POA: Insufficient documentation

## 2011-08-16 DIAGNOSIS — M79609 Pain in unspecified limb: Secondary | ICD-10-CM | POA: Insufficient documentation

## 2011-08-16 DIAGNOSIS — S8990XA Unspecified injury of unspecified lower leg, initial encounter: Secondary | ICD-10-CM | POA: Insufficient documentation

## 2011-08-16 DIAGNOSIS — X58XXXA Exposure to other specified factors, initial encounter: Secondary | ICD-10-CM | POA: Insufficient documentation

## 2011-08-16 DIAGNOSIS — R0989 Other specified symptoms and signs involving the circulatory and respiratory systems: Secondary | ICD-10-CM

## 2011-08-16 DIAGNOSIS — I10 Essential (primary) hypertension: Secondary | ICD-10-CM | POA: Insufficient documentation

## 2011-08-19 LAB — URINALYSIS, ROUTINE W REFLEX MICROSCOPIC
Glucose, UA: NEGATIVE
Ketones, ur: NEGATIVE
Protein, ur: NEGATIVE
pH: 6

## 2011-08-19 LAB — URINE MICROSCOPIC-ADD ON

## 2011-08-19 LAB — DIFFERENTIAL
Eosinophils Absolute: 0.2
Lymphocytes Relative: 20
Lymphs Abs: 1.3
Monocytes Relative: 12
Neutro Abs: 4.2
Neutrophils Relative %: 65

## 2011-08-19 LAB — BASIC METABOLIC PANEL
BUN: 14
Calcium: 9
Creatinine, Ser: 0.74
GFR calc Af Amer: >60
GFR calc non Af Amer: >60

## 2011-08-19 LAB — CBC
Platelets: 187
RBC: 4.56
WBC: 6.5

## 2011-11-10 ENCOUNTER — Emergency Department (HOSPITAL_COMMUNITY): Payer: 59

## 2011-11-10 ENCOUNTER — Encounter: Payer: Self-pay | Admitting: *Deleted

## 2011-11-10 ENCOUNTER — Emergency Department (HOSPITAL_COMMUNITY)
Admission: EM | Admit: 2011-11-10 | Discharge: 2011-11-10 | Disposition: A | Discharge status: Home / Self Care | Payer: 59 | Attending: Emergency Medicine | Admitting: Emergency Medicine

## 2011-11-10 DIAGNOSIS — E039 Hypothyroidism, unspecified: Secondary | ICD-10-CM | POA: Insufficient documentation

## 2011-11-10 DIAGNOSIS — M25519 Pain in unspecified shoulder: Secondary | ICD-10-CM | POA: Insufficient documentation

## 2011-11-10 DIAGNOSIS — I10 Essential (primary) hypertension: Secondary | ICD-10-CM | POA: Insufficient documentation

## 2011-11-10 DIAGNOSIS — T1490XA Injury, unspecified, initial encounter: Secondary | ICD-10-CM | POA: Insufficient documentation

## 2011-11-10 DIAGNOSIS — R5381 Other malaise: Secondary | ICD-10-CM | POA: Insufficient documentation

## 2011-11-10 DIAGNOSIS — W19XXXA Unspecified fall, initial encounter: Secondary | ICD-10-CM

## 2011-11-10 DIAGNOSIS — M25511 Pain in right shoulder: Secondary | ICD-10-CM

## 2011-11-10 DIAGNOSIS — E7800 Pure hypercholesterolemia, unspecified: Secondary | ICD-10-CM | POA: Insufficient documentation

## 2011-11-10 DIAGNOSIS — R269 Unspecified abnormalities of gait and mobility: Secondary | ICD-10-CM | POA: Insufficient documentation

## 2011-11-10 DIAGNOSIS — W07XXXA Fall from chair, initial encounter: Secondary | ICD-10-CM | POA: Insufficient documentation

## 2011-11-10 HISTORY — DX: Unspecified convulsions: R56.9

## 2011-11-10 HISTORY — DX: Pure hypercholesterolemia, unspecified: E78.00

## 2011-11-10 HISTORY — DX: Essential (primary) hypertension: I10

## 2011-11-10 NOTE — ED Notes (Signed)
Patient stable upon discharge. Wife here to get the patient.

## 2011-11-10 NOTE — ED Notes (Signed)
Patient states had 3 bones worked on in neck that were compressing on neck recently and thinks something else wrong with back.

## 2011-11-10 NOTE — ED Notes (Signed)
Calling ptar to come pick the patient up

## 2011-11-10 NOTE — ED Notes (Signed)
Pt in s/p fall, states he has an automatic wheelchair and fell after trying to get out of it, also c/o right shoulder pain after a fall two weeks ago

## 2011-11-10 NOTE — ED Provider Notes (Signed)
History     CSN: [id number] Arrival date & time: 11/10/2011  3:54 AM   First MD Initiated Contact with Patient 11/10/11 [phone]      Chief Complaint  Patient presents with  . Fall    (Consider location/radiation/quality/duration/timing/severity/associated sxs/prior treatment) Patient is a 60 y.o. male presenting with fall. The history is provided by the patient.  Fall Pertinent negatives include no fever, no numbness, no abdominal pain, no nausea, no vomiting, no hematuria and no headaches.  The patient complains of a fall from standing that occurred this evening just prior to arrival. He was attempting to stand when getting out of a recliner and reports both of his legs "wouldn't lock" and he fell to the ground, supporting himself with his left arm. There were no injuries sustained in the fall. He denies head injury or LOC. He denies any leg numbness, weakness, or tingling at this time.  The patient also complains of right shoulder pain x 2 weeks s/p a fall in the bathroom in his home when he slipped on water. There were no other injuries from the fall. The pain has been constant, aching, and moderate in intensity. The pain does not radiate anywhere. There has been no prior treatment and he did not seek evaluation after the fall. He denies any associated numbness or tingling though he does report he feels his shoulder is weak. He has not tried anything for the pain.  Past Medical History  Diagnosis Date  . Thyroid disease   . Seizures   . Hypertension   . High cholesterol     History reviewed. No pertinent past surgical history.  History reviewed. No pertinent family history.  History  Substance Use Topics  . Smoking status: Not on file  . Smokeless tobacco: Not on file  . Alcohol Use:       Review of Systems  Constitutional: Negative for fever and chills.  HENT: Negative for ear pain, congestion, sore throat, neck pain, neck stiffness and tinnitus.   Eyes: Negative for  pain and visual disturbance.  Respiratory: Negative for chest tightness, shortness of breath and wheezing.   Cardiovascular: Negative for chest pain and palpitations.       Chronic extremity swelling  Gastrointestinal: Negative for nausea, vomiting and abdominal pain.  Genitourinary: Negative for dysuria and hematuria.  Musculoskeletal: Positive for gait problem. Negative for back pain and joint swelling.  Skin: Negative for color change, rash and wound.  Neurological: Positive for weakness. Negative for dizziness, syncope, numbness and headaches.  Hematological: Does not bruise/bleed easily.  Psychiatric/Behavioral: Negative for suicidal ideas and confusion.    Allergies  Latex  Home Medications   Current Outpatient Rx  Name Route Sig Dispense Refill  . ASPIRIN EC 81 MG PO TBEC Oral Take 81 mg by mouth daily.      . ATORVASTATIN CALCIUM 40 MG PO TABS Oral Take 40 mg by mouth every evening.      Marland Kitchen BENAZEPRIL HCL 20 MG PO TABS Oral Take 20 mg by mouth every evening.     . ETODOLAC 500 MG PO TABS Oral Take 500 mg by mouth 2 (two) times daily.      Marland Kitchen LEVOTHYROXINE SODIUM 100 MCG PO TABS Oral Take 300 mcg by mouth daily.      Marland Kitchen METOPROLOL TARTRATE 25 MG PO TABS Oral Take 25 mg by mouth 2 (two) times daily.      . ADULT MULTIVITAMIN W/MINERALS CH Oral Take 1 tablet by mouth daily.      Marland Kitchen  PHENOBARBITAL 32.4 MG PO TABS Oral Take 32.4 mg by mouth every evening.      Marland Kitchen PHENOBARBITAL 64.8 MG PO TABS Oral Take 129.6 mg by mouth every evening. 2 tablets     . PHENYTOIN SODIUM EXTENDED 100 MG PO CAPS Oral Take 300 mg by mouth 2 (two) times daily.       BP 134/62  Pulse 60  Temp(Src) 98 F (36.7 C) (Oral)  Resp 16  SpO2 100%  Physical Exam  Constitutional: He is oriented to person, place, and time. He appears well-developed and well-nourished. No distress.  HENT:  Head: Normocephalic and atraumatic.  Right Ear: External ear normal.  Left Ear: External ear normal.  Nose: Nose normal.    Mouth/Throat: Oropharynx is clear and moist.  Eyes: Pupils are equal, round, and reactive to light.  Neck: Normal range of motion. Neck supple.  Cardiovascular: Normal rate, regular rhythm and intact distal pulses.   Pulmonary/Chest: Effort normal and breath sounds normal. He exhibits no tenderness.  Abdominal: Soft. Bowel sounds are normal. He exhibits no distension. There is no tenderness.  Musculoskeletal:       Right shoulder: He exhibits decreased range of motion and tenderness. He exhibits no bony tenderness, no swelling, no crepitus, no deformity and normal strength.       Cervical back: He exhibits no tenderness, no bony tenderness and no deformity.       Thoracic back: He exhibits no tenderness, no bony tenderness and no deformity.       Lumbar back: He exhibits no tenderness, no bony tenderness and no deformity.       Right shoulder with moderate TTP to deep palpation anteriorly over humeral head. No deformity or crepitus palpated. Flexion and abduction decreased active and passive.  All joints except for right shoulder with full ROM without tenderness. BLE edema 2+, non-pitting.  Neurological: He is alert and oriented to person, place, and time. He displays normal reflexes. No cranial nerve deficit. Coordination normal.  Skin: Skin is warm and dry. No rash noted.       2cm area of ecchymosis to proximal anterior right humerus in later stage of healing with no tenderness to palpation of area    ED Course  Procedures (including critical care time)  Labs Reviewed - No data to display Dg Shoulder Right  11/10/2011  *RADIOLOGY REPORT*  Clinical Data: Larey Seat 2 weeks ago, decreased range of motion  RIGHT SHOULDER - 2+ VIEW  Comparison: None.  Findings: The right humeral head is in normal position.  The right [facility] joint is normally aligned.  No acute bony abnormality is seen.  IMPRESSION: No acute abnormality.  Original Report Authenticated By: Juline Patch, M.D.    Dx 1: Fall Dx 2:  Right shoulder pain   MDM  X-ray reviewed, no fx to shoulder. Suspect decrease ROM secondary to early frozen shoulder- have advised pt that aggressive ROM is necessary to remedy this issue. He voices understanding. Have advised to f/u with ortho if continued pain or decreased ROM.  Pt ambulated in ED with some assistance from staff- this is typical for him as he uses a walker for ambulation at home.    Pt spouse arrived to ED and voiced concern about ability to ambulate and get into the house once they arrive home. Also voiced concerns about his medication dosing/whether or not he is taking his medications appropriately. Dr Alto Denver saw patient and discussed care with wife/pt who do not wish for facility  placement at this time- therefore, hospital admission is unnecessary. Dr Alto Denver has spoken with pt PMD who already has a care plan in place. Pt is to follow up with Dr Tenny Craw. Wife and pt are comfortable with plan for discharge and would like ambulance transportation home as pt will be unable to get into his house.    Elwyn Reach [place], [place] 11/10/11 1002

## 2011-11-12 ENCOUNTER — Other Ambulatory Visit (HOSPITAL_COMMUNITY): Payer: Self-pay | Admitting: Neurology

## 2011-11-12 DIAGNOSIS — M4712 Other spondylosis with myelopathy, cervical region: Secondary | ICD-10-CM

## 2011-11-12 NOTE — ED Provider Notes (Signed)
Medical screening examination/treatment/procedure(s) were performed by non-physician practitioner and as supervising physician I was immediately available for consultation/collaboration.  Cyndra Numbers, MD 11/12/11 [phone]

## 2011-11-14 ENCOUNTER — Emergency Department (HOSPITAL_COMMUNITY): Payer: 59

## 2011-11-14 ENCOUNTER — Other Ambulatory Visit: Payer: Self-pay

## 2011-11-14 ENCOUNTER — Encounter (HOSPITAL_COMMUNITY): Payer: Self-pay | Admitting: Neurology

## 2011-11-14 ENCOUNTER — Inpatient Hospital Stay (HOSPITAL_COMMUNITY)
Admission: EM | Admit: 2011-11-14 | Discharge: 2011-11-24 | DRG: 091 | Disposition: A | Discharge status: Skilled Nursing Facility | Payer: 59 | Attending: Internal Medicine | Admitting: Internal Medicine

## 2011-11-14 DIAGNOSIS — T68XXXA Hypothermia, initial encounter: Secondary | ICD-10-CM | POA: Diagnosis present

## 2011-11-14 DIAGNOSIS — T420X5A Adverse effect of hydantoin derivatives, initial encounter: Secondary | ICD-10-CM | POA: Diagnosis present

## 2011-11-14 DIAGNOSIS — I5033 Acute on chronic diastolic (congestive) heart failure: Secondary | ICD-10-CM | POA: Diagnosis not present

## 2011-11-14 DIAGNOSIS — K5900 Constipation, unspecified: Secondary | ICD-10-CM | POA: Diagnosis not present

## 2011-11-14 DIAGNOSIS — E860 Dehydration: Secondary | ICD-10-CM | POA: Diagnosis present

## 2011-11-14 DIAGNOSIS — E875 Hyperkalemia: Secondary | ICD-10-CM | POA: Diagnosis not present

## 2011-11-14 DIAGNOSIS — I10 Essential (primary) hypertension: Secondary | ICD-10-CM | POA: Diagnosis present

## 2011-11-14 DIAGNOSIS — T420X1A Poisoning by hydantoin derivatives, accidental (unintentional), initial encounter: Secondary | ICD-10-CM | POA: Diagnosis present

## 2011-11-14 DIAGNOSIS — Z7982 Long term (current) use of aspirin: Secondary | ICD-10-CM

## 2011-11-14 DIAGNOSIS — I5032 Chronic diastolic (congestive) heart failure: Secondary | ICD-10-CM | POA: Diagnosis present

## 2011-11-14 DIAGNOSIS — G40802 Other epilepsy, not intractable, without status epilepticus: Secondary | ICD-10-CM | POA: Diagnosis present

## 2011-11-14 DIAGNOSIS — E7800 Pure hypercholesterolemia, unspecified: Secondary | ICD-10-CM | POA: Diagnosis present

## 2011-11-14 DIAGNOSIS — R680 Hypothermia, not associated with low environmental temperature: Secondary | ICD-10-CM | POA: Diagnosis present

## 2011-11-14 DIAGNOSIS — R471 Dysarthria and anarthria: Principal | ICD-10-CM | POA: Diagnosis present

## 2011-11-14 DIAGNOSIS — G40909 Epilepsy, unspecified, not intractable, without status epilepticus: Secondary | ICD-10-CM | POA: Diagnosis present

## 2011-11-14 DIAGNOSIS — G4733 Obstructive sleep apnea (adult) (pediatric): Secondary | ICD-10-CM | POA: Diagnosis present

## 2011-11-14 DIAGNOSIS — R279 Unspecified lack of coordination: Secondary | ICD-10-CM | POA: Diagnosis present

## 2011-11-14 DIAGNOSIS — F29 Unspecified psychosis not due to a substance or known physiological condition: Secondary | ICD-10-CM | POA: Diagnosis present

## 2011-11-14 DIAGNOSIS — M4712 Other spondylosis with myelopathy, cervical region: Secondary | ICD-10-CM | POA: Diagnosis present

## 2011-11-14 DIAGNOSIS — A6000 Herpesviral infection of urogenital system, unspecified: Secondary | ICD-10-CM | POA: Diagnosis present

## 2011-11-14 DIAGNOSIS — E039 Hypothyroidism, unspecified: Secondary | ICD-10-CM | POA: Diagnosis present

## 2011-11-14 DIAGNOSIS — R4789 Other speech disturbances: Secondary | ICD-10-CM | POA: Diagnosis present

## 2011-11-14 HISTORY — DX: Hypothyroidism, unspecified: E03.9

## 2011-11-14 HISTORY — DX: Obstructive sleep apnea (adult) (pediatric): G47.33

## 2011-11-14 LAB — CBC
HCT: 39.3 % (ref 39.0–52.0)
Hemoglobin: 13.1 g/dL (ref 13.0–17.0)
MCH: 30.3 pg (ref 26.0–34.0)
MCHC: 33.3 g/dL (ref 30.0–36.0)
MCV: 93.4 fL (ref 78.0–100.0)
MCV: 92.9 fL (ref 78.0–100.0)
Platelets: 161 10*3/uL (ref 150–400)
RDW: 14.7 % (ref 11.5–15.5)
RDW: 14.8 % (ref 11.5–15.5)

## 2011-11-14 LAB — COMPREHENSIVE METABOLIC PANEL
ALT: 25 U/L (ref 0–53)
Calcium: 10.2 mg/dL (ref 8.4–10.5)
GFR calc Af Amer: >90 mL/min (ref >90)
Glucose, Bld: 85 mg/dL (ref 70–99)
Sodium: 142 mEq/L (ref 135–145)
Total Protein: 7.9 g/dL (ref 6.0–8.3)

## 2011-11-14 LAB — DIFFERENTIAL
Basophils Absolute: 0 10*3/uL (ref 0.0–0.1)
Eosinophils Absolute: 0.2 10*3/uL (ref 0.0–0.7)
Eosinophils Relative: 3 % (ref 0–5)
Lymphs Abs: 1.2 10*3/uL (ref 0.7–4.0)

## 2011-11-14 LAB — MRSA PCR SCREENING: MRSA by PCR: NEGATIVE

## 2011-11-14 LAB — PROTIME-INR: Prothrombin Time: 13.3 seconds (ref 11.6–15.2)

## 2011-11-14 LAB — TROPONIN I: Troponin I: <0.3 ng/mL (ref <0.30)

## 2011-11-14 LAB — CK TOTAL AND CKMB (NOT AT ARMC)
CK, MB: 5.5 ng/mL — ABNORMAL HIGH (ref 0.3–4.0)
Relative Index: INVALID (ref 0.0–2.5)

## 2011-11-14 LAB — CREATININE, SERUM: GFR calc non Af Amer: >90 mL/min (ref >90)

## 2011-11-14 MED ORDER — ACETAMINOPHEN 325 MG PO TABS: 650.0000 mg | ORAL_TABLET | Freq: Four times a day (QID) | ORAL | Status: DC | PRN

## 2011-11-14 MED ORDER — METOPROLOL TARTRATE 25 MG PO TABS
25.0000 mg | ORAL_TABLET | Freq: Two times a day (BID) | ORAL | Status: DC
  Administered 2011-11-14 – 2011-11-24 (×20): 25 mg via ORAL
  Filled 2011-11-14 (×21): qty 1

## 2011-11-14 MED ORDER — PHENOBARBITAL 64.8 MG PO TABS: 129.6000 mg | ORAL_TABLET | Freq: Every evening | ORAL | Status: DC

## 2011-11-14 MED ORDER — ACETAMINOPHEN 650 MG RE SUPP: 650.0000 mg | Freq: Four times a day (QID) | RECTAL | Status: DC | PRN

## 2011-11-14 MED ORDER — HYDROMORPHONE HCL PF 1 MG/ML IJ SOLN: 0.5000 mg | INTRAMUSCULAR | Status: DC | PRN

## 2011-11-14 MED ORDER — ASPIRIN EC 81 MG PO TBEC
81.0000 mg | DELAYED_RELEASE_TABLET | Freq: Every day | ORAL | Status: DC
  Administered 2011-11-15 – 2011-11-24 (×10): 81 mg via ORAL
  Filled 2011-11-14 (×10): qty 1

## 2011-11-14 MED ORDER — LEVOTHYROXINE SODIUM 150 MCG PO TABS
300.0000 ug | ORAL_TABLET | Freq: Every day | ORAL | Status: DC
  Administered 2011-11-15 – 2011-11-24 (×10): 300 ug via ORAL
  Filled 2011-11-14 (×11): qty 2

## 2011-11-14 MED ORDER — SODIUM CHLORIDE 0.9 % IV SOLN
INTRAVENOUS | Status: DC
  Administered 2011-11-14: 100 mL/h via INTRAVENOUS
  Administered 2011-11-15 – 2011-11-16 (×3): via INTRAVENOUS

## 2011-11-14 MED ORDER — HEPARIN SODIUM (PORCINE) 5000 UNIT/ML IJ SOLN
5000.0000 [IU] | Freq: Three times a day (TID) | INTRAMUSCULAR | Status: DC
  Administered 2011-11-14 – 2011-11-24 (×29): 5000 [IU] via SUBCUTANEOUS
  Filled 2011-11-14 (×32): qty 1

## 2011-11-14 MED ORDER — OXYCODONE HCL 5 MG PO TABS: 5.0000 mg | ORAL_TABLET | ORAL | Status: DC | PRN

## 2011-11-14 MED ORDER — BENAZEPRIL HCL 20 MG PO TABS
20.0000 mg | ORAL_TABLET | Freq: Every evening | ORAL | Status: DC
  Administered 2011-11-14 – 2011-11-16 (×3): 20 mg via ORAL
  Filled 2011-11-14 (×4): qty 1

## 2011-11-14 MED ORDER — SODIUM CHLORIDE 0.9 % IV SOLN: INTRAVENOUS | Status: AC

## 2011-11-14 MED ORDER — LEVETIRACETAM 500 MG PO TABS
500.0000 mg | ORAL_TABLET | Freq: Two times a day (BID) | ORAL | Status: DC
  Administered 2011-11-14 – 2011-11-24 (×20): 500 mg via ORAL
  Filled 2011-11-14 (×21): qty 1

## 2011-11-14 MED ORDER — ADULT MULTIVITAMIN W/MINERALS CH
1.0000 | ORAL_TABLET | Freq: Every day | ORAL | Status: DC
  Administered 2011-11-15 – 2011-11-24 (×10): 1 via ORAL
  Filled 2011-11-14 (×10): qty 1

## 2011-11-14 MED ORDER — PHENOBARBITAL 32.4 MG PO TABS: 32.4000 mg | ORAL_TABLET | Freq: Every evening | ORAL | Status: DC

## 2011-11-14 MED ORDER — PHENOBARBITAL 97.2 MG PO TABS
162.0000 mg | ORAL_TABLET | Freq: Every day | ORAL | Status: DC
  Administered 2011-11-14 – 2011-11-18 (×5): 162 mg via ORAL
  Filled 2011-11-14: qty 1
  Filled 2011-11-14 (×5): qty 5
  Filled 2011-11-14: qty 1
  Filled 2011-11-14: qty 3

## 2011-11-14 MED ORDER — ROSUVASTATIN CALCIUM 20 MG PO TABS
20.0000 mg | ORAL_TABLET | Freq: Every day | ORAL | Status: DC
  Administered 2011-11-15 – 2011-11-23 (×9): 20 mg via ORAL
  Filled 2011-11-14 (×10): qty 1

## 2011-11-14 NOTE — ED Provider Notes (Signed)
History     CSN: [id number]  Arrival date & time 11/14/11  1210   First MD Initiated Contact with Patient 11/14/11 1231      Chief Complaint  Patient presents with  . Code Stroke    (Consider location/radiation/quality/duration/timing/severity/associated sxs/prior treatment) The history is provided by the patient, medical records and the EMS personnel.   the patient is a 60 year old male, with a history of epilepsy, for which he takes Dilantin.  Today.  His wife noted that he had slurred speech, so she called the ambulance due to concern for a stroke.  The patient denies a headache.  He denies weakness in his arms or legs.  He denies chest pain, shortness of breath, abdominal pain, nausea, vomiting.  He denies dizziness, and visual changes.  Level V caveat for urgent need for evaluation due to possible stroke  Past Medical History  Diagnosis Date  . Thyroid disease   . Seizures   . Hypertension   . High cholesterol   . Hypothyroidism   . Seizure disorder     History reviewed. No pertinent past surgical history.  No family history on file.  History  Substance Use Topics  . Smoking status: Not on file  . Smokeless tobacco: Not on file  . Alcohol Use:       Review of Systems  Unable to perform ROS  level V caveat, for code stroke  Allergies  Latex  Home Medications   Current Outpatient Rx  Name Route Sig Dispense Refill  . ASPIRIN EC 81 MG PO TBEC Oral Take 81 mg by mouth daily.      . ATORVASTATIN CALCIUM 40 MG PO TABS Oral Take 40 mg by mouth every evening.      Marland Kitchen BENAZEPRIL HCL 20 MG PO TABS Oral Take 20 mg by mouth every evening.     . ETODOLAC 500 MG PO TABS Oral Take 500 mg by mouth 2 (two) times daily.      Marland Kitchen LEVETIRACETAM 500 MG PO TABS Oral Take 500-750 mg by mouth 2 (two) times daily. Start date:11/08/11 Take 1 tablet twice a day for 2 weeks Then take 1&1/2 tablet twice a day     . LEVOTHYROXINE SODIUM 100 MCG PO TABS Oral Take 300 mcg by mouth  daily.      Marland Kitchen METOPROLOL TARTRATE 25 MG PO TABS Oral Take 25 mg by mouth 2 (two) times daily.      . ADULT MULTIVITAMIN W/MINERALS CH Oral Take 1 tablet by mouth daily.      Marland Kitchen PHENOBARBITAL 32.4 MG PO TABS Oral Take 32.4 mg by mouth every evening.      Marland Kitchen PHENOBARBITAL 64.8 MG PO TABS Oral Take 129.6 mg by mouth every evening. 2 tablets     . PHENYTOIN SODIUM EXTENDED 100 MG PO CAPS Oral Take 300 mg by mouth 2 (two) times daily.       BP 133/90  Pulse 55  Temp(Src) 91.6 F (33.1 C) (Rectal)  Resp 16  SpO2 95%  Physical Exam  Constitutional: He is oriented to person, place, and time.       Obese  HENT:  Head: Normocephalic and atraumatic.  Eyes: Conjunctivae are normal. Pupils are equal, round, and reactive to light.  Neck: Normal range of motion. Neck supple.  Cardiovascular: Normal rate, regular rhythm and normal heart sounds.   Pulmonary/Chest: Effort normal and breath sounds normal.  Abdominal: Soft. Bowel sounds are normal.  Musculoskeletal: Normal range of motion.  Neurological:  He is alert and oriented to person, place, and time. No cranial nerve deficit. Coordination normal.       Slurred speech Facial asymmetry Positive symmetric smile  Skin: Skin is warm and dry.  Psychiatric: He has a normal mood and affect.    ED Course  Procedures (including critical care time) 60 year old male, presents to the emergency department with slurred speech.  He has a history of epilepsy, for which he takes Dilantin.  There is no evidence of a neurological deficit.  At this time.  Concern is for a stroke versus Dilantin toxicity.  We will proceed with the code stroke protocol, and perform Dilantin level, as well.  To determine possible etiology for his slurred speech.  Labs Reviewed  APTT - Abnormal; Notable for the following:    aPTT 43 (*)    All other components within normal limits  DIFFERENTIAL - Abnormal; Notable for the following:    Monocytes Relative 13 (*)    All other  components within normal limits  COMPREHENSIVE METABOLIC PANEL - Abnormal; Notable for the following:    Alkaline Phosphatase 190 (*)    Total Bilirubin 0.2 (*)    All other components within normal limits  CK TOTAL AND CKMB - Abnormal; Notable for the following:    CK, MB 5.5 (*)    All other components within normal limits  PHENYTOIN LEVEL, TOTAL - Abnormal; Notable for the following:    Phenytoin Lvl 25.3 (*)    All other components within normal limits  PROTIME-INR  CBC  TROPONIN I  PHENOBARBITAL LEVEL   Ct Head Wo Contrast  11/14/2011  *RADIOLOGY REPORT*  Clinical Data: Slurred speech.  CT HEAD WITHOUT CONTRAST  Technique:  Contiguous axial images were obtained from the base of the skull through the vertex without contrast.  Comparison: None.  Findings: No acute intracranial abnormality.  Specifically, no hemorrhage, hydrocephalus, mass lesion, acute infarction, or significant intracranial injury.  No acute calvarial abnormality. Visualized paranasal sinuses and mastoids clear.  Orbital soft tissues unremarkable.  IMPRESSION: No acute intracranial abnormality.  Critical Value/emergent results were called by telephone at the time of interpretation on 11/14/2011  at 12:28 p.m.  to  Dr. Weldon Inches, who verbally acknowledged these results.  Original Report Authenticated By: Cyndie Chime, M.D.     Discussed with Radiologist, dr. Kearney Hard, no stroke Discussed with dr. Thad Ranger. Neurology.  She does not believe this is a stroke.  Concern for pht toxicity  3:09 PM Discussed with [facility]. They will admit   MDM  Dilantin toxicity No evidence of stroke.  No neurological deficit.  No alteration in mental status.  No signs of systemic illness        Nicholes Stairs, MD 11/14/11 [phone]

## 2011-11-14 NOTE — ED Notes (Signed)
IV team at bedside 

## 2011-11-14 NOTE — ED Notes (Addendum)
Code stroke called by [facility] at 1158. Pager malfunction at our facility, paged not received. Pt arrived 1213. EDP Caporossi at bridge 1213. Stroke team arrived 1216. Pt arrival in CT 1216. LSN 0800 by pt wife. CT read 1228. Phlebotomist arrived 1239. Code Stroke cancelled at 1243

## 2011-11-14 NOTE — ED Notes (Signed)
Pt speech is slurred. Pt responses inappropriate at times. Pt alert and oriented. Speech is slurred.

## 2011-11-14 NOTE — ED Notes (Signed)
Bair Hugger on Pt.

## 2011-11-14 NOTE — ED Notes (Signed)
PER EMS- Pt hypertensive 190/120, CBG 77. EMS reporting pt arguing with wife regarding nursing home placement.

## 2011-11-14 NOTE — ED Notes (Signed)
Pt appearing alert, oriented. Speech slurred. Pt extremities cold, edematous 2 + pitting edema. Skin dry, cool. Lung sounds clear.

## 2011-11-14 NOTE — ED Notes (Signed)
Pt and Pt brother reporting pt wife keeps home 50 degrees.

## 2011-11-14 NOTE — ED Notes (Signed)
IV team unable to get any access.

## 2011-11-14 NOTE — ED Notes (Signed)
Pt reporting hx of similar situation when Dilantin medication switched.

## 2011-11-14 NOTE — Consult Note (Signed)
Referring Physician: Caporossi    Chief Complaint: Slurred speech  HPI: [name] is an 60 y.o. male who was arguing with his wife this morning about whether he would ned NH placement.  Acutely at about 0800 began to have slurred speech.  It was intermittent but after not resolving completely EMS was called.  Patient was brought in as a code stroke.  Initial NIHSS of 1.  LSN: 0800  tPA Given: No: Minimal disability mRankin:  Past Medical History  Diagnosis Date  . Thyroid disease   . Seizures   . Hypertension   . High cholesterol   . Hypothyroidism   . Seizure disorder     History reviewed. No pertinent past surgical history.  No family history on file. Social History:  does not have a smoking history on file. He does not have any smokeless tobacco history on file. His alcohol and drug histories not on file.  Allergies:  Allergies  Allergen Reactions  . Latex Rash    Hands rash and  mild hives    Medications: I have reviewed the patient's current medications. Prior to Admission:  ASA, Lipitor, Lotensin, Lodine, Keppra, Synthroid, Lopressor, MVI, Phenobarbital, Dilantin  ROS: History obtained from the patient  General ROS: negative for - chills, fatigue, fever, night sweats, weight gain or weight loss Psychological ROS: negative for - behavioral disorder, hallucinations, memory difficulties, mood swings or suicidal ideation Ophthalmic ROS: wears glasses ENT ROS: negative for - epistaxis, nasal discharge, oral lesions, sore throat, tinnitus or vertigo Allergy and Immunology ROS: negative for - hives or itchy/watery eyes Hematological and Lymphatic ROS: negative for - bleeding problems, bruising or swollen lymph nodes Endocrine ROS: negative for - galactorrhea, hair pattern changes, polydipsia/polyuria or temperature intolerance Respiratory ROS: negative for - cough, hemoptysis, shortness of breath or wheezing Cardiovascular ROS: negative for - extremity  edema Gastrointestinal ROS: negative for - abdominal pain, diarrhea, hematemesis, nausea/vomiting or stool incontinence Genito-Urinary ROS: negative for - dysuria, hematuria, incontinence or urinary frequency/urgency Musculoskeletal ROS: negative for - joint swelling or muscular weakness Neurological ROS: as noted in HPI Dermatological ROS: negative for rash and skin lesion changes  Physical Examination: Blood pressure 157/88, pulse 47, resp. rate 20, SpO2 96.00%.  Neurologic Examination: Mental Status: Alert, oriented, thought content appropriate.  Speech slurred but fluent.  Able to follow 3 step commands without difficulty. Cranial Nerves: II: visual fields grossly normal, pupils equal, round, reactive to light and accommodation III,IV, VI: ptosis not present, extra-ocular motions intact bilaterally with horizontal nystagmus in all gazes V,VII: smile symmetric, facial light touch sensation normal bilaterally VIII: hearing normal bilaterally IX,X: gag reflex present XI: trapezius strength/neck flexion strength normal bilaterally XII: tongue strength normal  Motor: Right : Upper extremity   5/5    Left:     Upper extremity   5/5  Lower extremity   5/5     Lower extremity   5/5 Tone normal. Extensive edema throughout Sensory: Pinprick and light touch intact throughout, bilaterally Deep Tendon Reflexes: 1+ in he upper extremities, absent in the lower extremities Plantars: Right: mute   Left: mute Cerebellar: normal finger-to-nose and normal heel-to-shin test  No results found for this or any previous visit (from the past 48 hour(s)). Ct Head Wo Contrast  11/14/2011  *RADIOLOGY REPORT*  Clinical Data: Slurred speech.  CT HEAD WITHOUT CONTRAST  Technique:  Contiguous axial images were obtained from the base of the skull through the vertex without contrast.  Comparison: None.  Findings: No  acute intracranial abnormality.  Specifically, no hemorrhage, hydrocephalus, mass lesion, acute  infarction, or significant intracranial injury.  No acute calvarial abnormality. Visualized paranasal sinuses and mastoids clear.  Orbital soft tissues unremarkable.  IMPRESSION: No acute intracranial abnormality.  Critical Value/emergent results were called by telephone at the time of interpretation on 11/14/2011  at 12:28 p.m.  to  Dr. Weldon Inches, who verbally acknowledged these results.  Original Report Authenticated By: Cyndie Chime, M.D.    Assessment: 60 y.o. male presenting with acute onset slurred speech.  Exam only significant for nystagmus.  Doubt stroke at this time but concerned about the possibility of Dilantin toxicity.  Would not initiate full stroke work up at this time.  CT unremarkable for an acute event.  Stroke Risk Factors - hyperlipidemia and hypertension  Plan: 1. Dilantin and Phenobarbital levels stat 2. MRI of the brain without contrast.  No stroke work up unless imaging diagnostic for acute stroke. 3. Continue ASA daily   Thana Farr, MD [facility] [phone] 11/14/2011, 1:09 PM

## 2011-11-14 NOTE — ED Notes (Signed)
PER EMS-Pt comes from home. LSN 0800, pt wife reporting slurred speech since 0800. Pt wife reporting recent medication change. EMS noted no other neuro deficits other than slurred speech.

## 2011-11-14 NOTE — ED Notes (Signed)
IV team paged for IV start at 1252. Called back at 1255. Multiple attempts for IV start by 2 RN's. Phlebotomy at bedside to get blood.

## 2011-11-14 NOTE — H&P (Signed)
PCP:   [name], MD  Primary Neurologist: Dr Lesia Sago.  Chief Complaint:  Confusion, slurred speech and difficulty in movement, today.  HPI: This is a 60 year old male, with known history of HTN, dyslipidemia, seizure disorder, hypothyroidism, OSA, morbid obesity , cervical stenosis with spinal cord compromise, s/p C3-C5 arthrodesis 03/2011, presenting with above symptoms. Patient is somewhat confused, so history is supplemented by his brother and sister in law, who were present in the ED. Apparently, at about 9:30 am on 11/14/11, patient called his brother in [place], to say that he was feeling very unwell, could not move his legs. His speech sounded very slurred. Patient's spouse called the office of Dr Lesia Sago, his primary neurologist, and the Dr on call recommended ED. Spouse called EMS. Patient was seen as a "Code Stroke" by the Neurology team, who felt that Dilantin toxicity might be the culprit. Dilantin level was 25.3. And head CT was negative. Patient was referred to the medical [facility] for admission and further management.   Allergies:   Allergies  Allergen Reactions  . Latex Rash    Hands rash and  mild hives      Past Medical History  Diagnosis Date  . Thyroid disease   . Seizures   . Hypertension   . High cholesterol   . Hypothyroidism   . Seizure disorder     History reviewed. No pertinent past surgical history.  Prior to Admission medications   Medication Sig Start Date End Date Taking? Authorizing Provider  aspirin EC 81 MG tablet Take 81 mg by mouth daily.     Yes Historical Provider, MD  atorvastatin (LIPITOR) 40 MG tablet Take 40 mg by mouth every evening.     Yes Historical Provider, MD  benazepril (LOTENSIN) 20 MG tablet Take 20 mg by mouth every evening.    Yes Historical Provider, MD  etodolac (LODINE) 500 MG tablet Take 500 mg by mouth 2 (two) times daily.     Yes Historical Provider, MD  levETIRAcetam (KEPPRA) 500 MG tablet Take  500-750 mg by mouth 2 (two) times daily. Start date:11/08/11 Take 1 tablet twice a day for 2 weeks Then take 1&1/2 tablet twice a day    Yes Historical Provider, MD  levothyroxine (SYNTHROID, LEVOTHROID) 100 MCG tablet Take 300 mcg by mouth daily.     Yes Historical Provider, MD  metoprolol tartrate (LOPRESSOR) 25 MG tablet Take 25 mg by mouth 2 (two) times daily.     Yes Historical Provider, MD  Multiple Vitamin (MULITIVITAMIN WITH MINERALS) TABS Take 1 tablet by mouth daily.     Yes Historical Provider, MD  PHENobarbital (LUMINAL) 32.4 MG tablet Take 32.4 mg by mouth every evening.     Yes Historical Provider, MD  PHENobarbital (LUMINAL) 64.8 MG tablet Take 129.6 mg by mouth every evening. 2 tablets    Yes Historical Provider, MD  phenytoin (DILANTIN) 100 MG ER capsule Take 300 mg by mouth 2 (two) times daily.    Yes Historical Provider, MD    Social History:  does not have a smoking history on file. He does not have any smokeless tobacco history on file. His alcohol and drug histories not on file. 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  No family history on file. Married, has no offspring. Father died at age 9 years. He had laryngeal and eye cancer, and a history of melanoma.  Review of Systems:  As per HPI and  chief complaint. Patent denies fatigue, diminished appetite, weight loss, fever, chills, headache, blurred vision. He did have difficulty in speaking this morning. No chest pain, cough, shortness of breath, orthopnea, paroxysmal nocturnal dyspnea, nausea, diaphoresis, abdominal pain, vomiting, diarrhea, belching, heartburn, hematemesis, melena, dysuria, nocturia, urinary frequency, hematochezia, lower extremity pain, or redness. He used to ambulate with a cane up to 2 weeks ago, but now requires a walker. The rest of the systems review is negative.  Physical Exam: General:  Patient does not appear to be in obvious acute distress at this time.  He has a Lawyer, is alert, communicative, fully oriented, talking in complete sentences, not short of breath at rest.  HEENT:  No clinical pallor, no jaundice, no conjunctival injection or discharge. NECK:  Supple, JVP not seen, no carotid bruits, no palpable lymphadenopathy. CHEST:  Clinically clear to auscultation, no wheezes, no crackles. HEART:  Sounds 1 and 2 heard, normal, regular, no murmurs. ABDOMEN:  Morbidly obese, soft, non-tender, no palpable organomegaly, no palpable masses, normal bowel sounds. GENITALIA:  Not examined. LOWER EXTREMITIES:  Moderate edema, possibly lymphedematous, has stasis changes, palpable peripheral pulses. MUSCULOSKELETAL SYSTEM:  Generalized osteoarthritic changes. CENTRAL NERVOUS SYSTEM:  Has bilateral nystagmus, worse on right lateral gaze. No other focal neurologic deficit on gross examination.  Labs on Admission:  Results for orders placed during the hospital encounter of 11/14/11 (from the past 48 hour(s))  PROTIME-INR     Status: Normal   Collection Time   11/14/11 12:56 PM      Component Value Range Comment   Prothrombin Time 13.3  11.6 - 15.2 (seconds)    INR 0.99  0.00 - 1.49    APTT     Status: Abnormal   Collection Time   11/14/11 12:56 PM      Component Value Range Comment   aPTT 43 (*) 24 - 37 (seconds)   CBC     Status: Normal   Collection Time   11/14/11 12:56 PM      Component Value Range Comment   WBC 4.7  4.0 - 10.5 (K/uL)    RBC 4.72  4.22 - 5.81 (MIL/uL)    Hemoglobin 14.3  13.0 - 17.0 (g/dL)    HCT 45.4  09.8 - 11.9 (%)    MCV 93.4  78.0 - 100.0 (fL)    MCH 30.3  26.0 - 34.0 (pg)    MCHC 32.4  30.0 - 36.0 (g/dL)    RDW 14.7  82.9 - 56.2 (%)    Platelets 161  150 - 400 (K/uL)   DIFFERENTIAL     Status: Abnormal   Collection Time   11/14/11 12:56 PM      Component Value Range Comment   Neutrophils Relative 60  43 - 77 (%)    Neutro Abs 2.8  1.7 - 7.7 (K/uL)    Lymphocytes Relative 25  12 - 46 (%)    Lymphs Abs 1.2   0.7 - 4.0 (K/uL)    Monocytes Relative 13 (*) 3 - 12 (%)    Monocytes Absolute 0.6  0.1 - 1.0 (K/uL)    Eosinophils Relative 3  0 - 5 (%)    Eosinophils Absolute 0.2  0.0 - 0.7 (K/uL)    Basophils Relative 0  0 - 1 (%)    Basophils Absolute 0.0  0.0 - 0.1 (K/uL)   COMPREHENSIVE METABOLIC PANEL     Status: Abnormal   Collection Time   11/14/11 12:56 PM  Component Value Range Comment   Sodium 142  135 - 145 (mEq/L)    Potassium 4.4  3.5 - 5.1 (mEq/L)    Chloride 106  96 - 112 (mEq/L)    CO2 26  19 - 32 (mEq/L)    Glucose, Bld 85  70 - 99 (mg/dL)    BUN 23  6 - 23 (mg/dL)    Creatinine, Ser 1.47  0.50 - 1.35 (mg/dL)    Calcium 82.9  8.4 - 10.5 (mg/dL)    Total Protein 7.9  6.0 - 8.3 (g/dL)    Albumin 3.9  3.5 - 5.2 (g/dL)    AST 29  0 - 37 (U/L)    ALT 25  0 - 53 (U/L)    Alkaline Phosphatase 190 (*) 39 - 117 (U/L)    Total Bilirubin 0.2 (*) 0.3 - 1.2 (mg/dL)    GFR calc non Af Amer >90  >90 (mL/min)    GFR calc Af Amer >90  >90 (mL/min)   PHENYTOIN LEVEL, TOTAL     Status: Abnormal   Collection Time   11/14/11 12:56 PM      Component Value Range Comment   Phenytoin Lvl 25.3 (*) 10.0 - 20.0 (ug/mL)   PHENOBARBITAL LEVEL     Status: Normal   Collection Time   11/14/11 12:56 PM      Component Value Range Comment   Phenobarbital 27.7  15.0 - 40.0 (ug/mL)   CK TOTAL AND CKMB     Status: Abnormal   Collection Time   11/14/11 12:57 PM      Component Value Range Comment   Total CK 81  7 - 232 (U/L)    CK, MB 5.5 (*) 0.3 - 4.0 (ng/mL)    Relative Index RELATIVE INDEX IS INVALID  0.0 - 2.5    TROPONIN I     Status: Normal   Collection Time   11/14/11 12:57 PM      Component Value Range Comment   Troponin I <0.30  <0.30 (ng/mL)     Radiological Exams on Admission:  *RADIOLOGY REPORT*  Clinical Data: Slurred speech.  CT HEAD WITHOUT CONTRAST  Technique: Contiguous axial images were obtained from the base of the skull through the vertex without  contrast.  Comparison: None.  Findings: No acute intracranial abnormality. Specifically, no hemorrhage, hydrocephalus, mass lesion, acute infarction, or significant intracranial injury. No acute calvarial abnormality. Visualized paranasal sinuses and mastoids clear. Orbital soft tissues unremarkable.  IMPRESSION: No acute intracranial abnormality.  Critical Value/emergent results were called by telephone at the time of interpretation on 11/14/2011 at 12:28 p.m. to Dr. Weldon Inches, who verbally acknowledged these results.  Original Report Authenticated By: Cyndie Chime, M.D.       Assessment/Plan Principal Problem:  *Dilantin toxicity: This was confirmed by elevated Dilantin levels. Patient will be admitted for observation, telemetric monitoring, and intravenous fluid hydration. Per family, about a month ago, he did have toxicity, and Dilantin level was reduced at that time. For now, Dilantin will be placed on hold, and serial levels obtained. Active Problems:  1. Dehydration: Patient has an elevated BUN/Creatinine ration, consistent with dehydration. We shall manage with iv fluids, as described above.  2. Dysarthria: Secondary to Dilantin toxicity. Already appreciably improved, although patient remains mildly confused.  3. Hypothermia: Temperature at presentation, was 91.6 Patient was placed on Bair Hugger in the ED, with improvement. We shall check TSH, given history of hypothyroidism.  4. Hypothyroidism: Continue replacement therapy, and check TSH.  5. HTN (hypertension): Appears controlled at this time.  6. Seizure disorder: Asymptomatic.  Further management will depend on clinical course.   Time Spent on Admission: 1 hour.  [name]. MD.

## 2011-11-15 LAB — CBC
Hemoglobin: 12.3 g/dL — ABNORMAL LOW (ref 13.0–17.0)
MCV: 93.8 fL (ref 78.0–100.0)
Platelets: 151 10*3/uL (ref 150–400)
RBC: 4.03 MIL/uL — ABNORMAL LOW (ref 4.22–5.81)
WBC: 3.3 10*3/uL — ABNORMAL LOW (ref 4.0–10.5)

## 2011-11-15 LAB — TSH: TSH: 0.076 u[IU]/mL — ABNORMAL LOW (ref 0.350–4.500)

## 2011-11-15 LAB — COMPREHENSIVE METABOLIC PANEL
ALT: 18 U/L (ref 0–53)
AST: 21 U/L (ref 0–37)
CO2: 26 mEq/L (ref 19–32)
Chloride: 108 mEq/L (ref 96–112)
Creatinine, Ser: 0.97 mg/dL (ref 0.50–1.35)
GFR calc Af Amer: >90 mL/min (ref >90)
GFR calc non Af Amer: 89 mL/min — ABNORMAL LOW (ref >90)
Glucose, Bld: 85 mg/dL (ref 70–99)
Sodium: 142 mEq/L (ref 135–145)
Total Bilirubin: 0.2 mg/dL — ABNORMAL LOW (ref 0.3–1.2)

## 2011-11-15 MED ORDER — VALACYCLOVIR HCL 500 MG PO TABS
1000.0000 mg | ORAL_TABLET | Freq: Two times a day (BID) | ORAL | Status: DC
  Administered 2011-11-15 – 2011-11-19 (×10): 1000 mg via ORAL
  Filled 2011-11-15 (×12): qty 2

## 2011-11-15 MED ORDER — MUPIROCIN CALCIUM 2 % EX CREA
TOPICAL_CREAM | Freq: Two times a day (BID) | CUTANEOUS | Status: DC
  Administered 2011-11-15 (×2): via TOPICAL
  Administered 2011-11-16: 1 via TOPICAL
  Administered 2011-11-16 – 2011-11-23 (×14): via TOPICAL
  Filled 2011-11-15 (×2): qty 15

## 2011-11-15 NOTE — Progress Notes (Signed)
Physical Therapy Evaluation Patient Details Name: [name] MRN: [id number] DOB: [birth year] Today's Date: 11/15/2011  Problem List:  Patient Active Problem List  Diagnoses  . Dilantin toxicity  . Dehydration  . Dysarthria  . Hypothermia  . Hypothyroidism  . HTN (hypertension)  . Seizure disorder    Past Medical History:  Past Medical History  Diagnosis Date  . Thyroid disease   . Seizures   . Hypertension   . High cholesterol   . Hypothyroidism   . Seizure disorder   . Sleep apnea, obstructive   . Sleep apnea, obstructive    Past Surgical History:  Past Surgical History  Procedure Date  . Eyes 1996    rt eye x4 times,    PT Assessment/Plan/Recommendation PT Assessment Clinical Impression Statement: Patient is s/p dilantin toxicity with decreased mobility secondary to deconditioning and edema bil LEs.  Needs continued PT as pt. needs to be modif independent on D/C home.  Will need NHP with therapy.   PT Recommendation/Assessment: Patient will need skilled PT in the acute care venue PT Problem List: Decreased activity tolerance;Decreased balance;Decreased mobility;Decreased knowledge of use of DME;Decreased safety awareness;Decreased knowledge of precautions Barriers to Discharge: Decreased caregiver support PT Therapy Diagnosis : Difficulty walking PT Plan PT Frequency: Min 3X/week PT Treatment/Interventions: DME instruction;Gait training;Functional mobility training;Therapeutic activities;Therapeutic exercise;Balance training;Patient/family education PT Recommendation Follow Up Recommendations: Skilled nursing facility;24 hour supervision/assistance Equipment Recommended: Defer to next venue (Will need a tall wide RW) PT Goals  Acute Rehab PT Goals PT Goal Formulation: With patient Time For Goal Achievement: 2 weeks Pt will go Supine/Side to Sit: with modified independence PT Goal: Supine/Side to Sit - Progress: Other (comment) Pt will go Sit to Stand:  with modified independence;with cues (comment type and amount) PT Goal: Sit to Stand - Progress: Other (comment) Pt will Transfer Bed to Chair/Chair to Bed: with supervision PT Transfer Goal: Bed to Chair/Chair to Bed - Progress: Other (comment) Pt will Ambulate: 51 - 150 feet;with supervision;with least restrictive assistive device PT Goal: Ambulate - Progress: Other (comment) Pt will Perform Home Exercise Program: with supervision, verbal cues required/provided PT Goal: Perform Home Exercise Program - Progress: Other (comment)  PT Evaluation Precautions/Restrictions  Precautions Precautions: Fall Required Braces or Orthoses: No Restrictions Weight Bearing Restrictions: No Prior Functioning  Home Living Lives With: Spouse Receives Help From: Family (Wife manic depressive at present; wife does his meds) Type of Home: House Home Layout: One level Home Access: Stairs to enter Entrance Stairs-Rails: Left Entrance Stairs-Number of Steps: 3 Bathroom Shower/Tub: Health visitor: Handicapped height Bathroom Accessibility: No Home Adaptive Equipment: Straight cane;Shower chair with back;Raised toilet seat with rails;Hand-held shower hose (Lift chair) Additional Comments: [facility] came out nurse came out to check wound on leg, PT/OT HH services Prior Function Level of Independence: Independent with basic ADLs;Independent with homemaking with ambulation;Independent with gait (up until 2 weeks ago) Driving: No (wife drives and pt trying to sign up for [employer]) Vocation: On disability Cognition Cognition Arousal/Alertness: Awake/alert Overall Cognitive Status: Appears within functional limits for tasks assessed Orientation Level: Oriented X4 Sensation/Coordination Sensation Light Touch: Appears Intact Stereognosis: Not tested Hot/Cold: Not tested Proprioception: Not tested Coordination Gross Motor Movements are Fluid and Coordinated: Yes Fine Motor Movements are Fluid and  Coordinated: Yes Extremity Assessment RUE Assessment RUE Assessment: Within Functional Limits LUE Assessment LUE Assessment: Within Functional Limits RLE Assessment RLE Assessment: Within Functional Limits LLE Assessment LLE Assessment: Within Functional Limits Mobility (including Balance) Bed Mobility Bed  Mobility: Yes Rolling Left: 4: Min assist;With rail Rolling Left Details (indicate cue type and reason): Pt. uses momentum as LEs with incr. edema, green socks would not fit without cutting them some.  No gray socks on unit. Left Sidelying to Sit: 4: Min assist;With rails;HOB flat Left Sidelying to Sit Details (indicate cue type and reason): Uses momentum to sit up with incr. time and rocking motion used. Sitting - Scoot to Edge of Bed: 5: Supervision Sitting - Scoot to [place] of Bed Details (indicate cue type and reason): Incr. time to complete Transfers Transfers: Yes Sit to Stand: 1: +2 Total assist;Patient percentage (comment);From elevated surface;With upper extremity assist;From bed (pt = 65-75%) Sit to Stand Details (indicate cue type and reason): On initial sit to stand, pt. unable to achieve full stand with knees hyperextended and weight not equally distributed on feet with poor postural stability.  On second attempt, pt. had feet in better placement and was able to pivot to recliner with less assist overall.   Stand to Sit: 1: +2 Total assist;Patient percentage (comment);With upper extremity assist;With armrests;To chair/3-in-1 (pt = 50-60%) Stand to Sit Details: Pt. sat down abruptly with uncontrolled descent into chair needing PT assist to control movement. Stand Pivot Transfers: 1: +2 Total assist;Patient percentage (comment) (pt = 70%) Stand Pivot Transfer Details (indicate cue type and reason): had 2 person assist and pivoted around to recliner.  Shifted weight with manual cues. Ambulation/Gait Ambulation/Gait: No Stairs: No Wheelchair Mobility Wheelchair Mobility: No    Posture/Postural Control Posture/Postural Control: No significant limitations Postural Limitations: Pt. is tall which makes mobility and postural stability more difficult especially given his edema is bil LEs Balance Balance Assessed: Yes Static Standing Balance Static Standing - Balance Support: Bilateral upper extremity supported Static Standing - Level of Assistance: 1: +2 Total assist;Patient percentage (comment) (pt = 50%) Static Standing - Comment/# of Minutes: Stood about 30 seconds statically with above assist Exercise  Total Joint Exercises Ankle Circles/Pumps: AROM;Both;10 reps;Supine Quad Sets: AROM;Both;10 reps;Supine Heel Slides: AROM;Both;10 reps;Supine End of Session PT - End of Session Equipment Utilized During Treatment: Gait belt Activity Tolerance: Patient limited by fatigue Patient left: in chair;with call bell in reach Nurse Communication: Mobility status for transfers;Need for lift equipment General Behavior During Session: [facility] for tasks performed Cognition: [facility] for tasks performed  INGOLD,Kinsey Karch 11/15/2011, 3:57 PM  [facility] Acute Rehabilitation [phone] (pager)

## 2011-11-15 NOTE — Progress Notes (Signed)
PT did not want to go on cpap at this time. PT said he would call when he is ready for it. RT will continue to monitor.

## 2011-11-15 NOTE — Plan of Care (Signed)
Problem: Consults Goal: Skin Care Protocol Initiated - if indicated If consults are not indicated, leave blank or document N/A Outcome: Progressing pts sacrum excoriated, cleaned and barrier cream skin protectant applied

## 2011-11-15 NOTE — Progress Notes (Signed)
Subjective: Awake, oriented, speech more clear. Denies chest pain, dyspnea. He relates that he takes his medications as instructed.  Objective: Filed Vitals:   11/15/11 0000 11/15/11 0100 11/15/11 0444 11/15/11 0500  BP: 93/48 96/59 126/71   Pulse: 69 69 63   Temp:  97.5 F (36.4 C) 96 F (35.6 C)   TempSrc:  Oral Axillary   Resp: 11 12 11    Height:      Weight:    136 kg (299 lb 13.2 oz)  SpO2: 92% 96% 98%    Weight change:   Intake/Output Summary (Last 24 hours) at 11/15/11 0745 Last data filed at 11/15/11 0600  Gross per 24 hour  Intake   1840 ml  Output    100 ml  Net   1740 ml    General: Alert, awake, oriented x3, in no acute distress.  HEENT: No bruits, no goiter.  Heart: Regular rate and rhythm, without murmurs, rubs, gallops.  Lungs: Crackles left side, bilateral air movement.  Abdomen: Soft, nontender, nondistended, positive bowel sounds.  Neuro: Grossly intact, nonfocal. Extremities; edema plus 2, mild redness left lower extremity.  Genital: ulceration penis.   Lab Results:  Basename 11/15/11 0500 11/14/11 2010 11/14/11 1256  NA 142 -- 142  K 4.3 -- 4.4  CL 108 -- 106  CO2 26 -- 26  GLUCOSE 85 -- 85  BUN 22 -- 23  CREATININE 0.97 0.93 --  CALCIUM 9.2 -- 10.2  MG -- -- --  PHOS -- -- --    Basename 11/15/11 0500 11/14/11 1256  AST 21 29  ALT 18 25  ALKPHOS 170* 190*  BILITOT 0.2* 0.2*  PROT 6.6 7.9  ALBUMIN 3.0* 3.9    Basename 11/15/11 0500 11/14/11 2010 11/14/11 1256  WBC 3.3* 5.7 --  NEUTROABS -- -- 2.8  HGB 12.3* 13.1 --  HCT 37.8* 39.3 --  MCV 93.8 92.9 --  PLT 151 175 --    Basename 11/14/11 1257  CKTOTAL 81  CKMB 5.5*  CKMBINDEX --  TROPONINI <0.30    Micro Results: Recent Results (from the past 240 hour(s))  MRSA PCR SCREENING     Status: Normal   Collection Time   11/14/11  6:23 PM      Component Value Range Status Comment   MRSA by PCR NEGATIVE  NEGATIVE  Final     Studies/Results: Ct Head Wo  Contrast  11/14/2011  *RADIOLOGY REPORT*  Clinical Data: Slurred speech.  CT HEAD WITHOUT CONTRAST  Technique:  Contiguous axial images were obtained from the base of the skull through the vertex without contrast.  Comparison: None.  Findings: No acute intracranial abnormality.  Specifically, no hemorrhage, hydrocephalus, mass lesion, acute infarction, or significant intracranial injury.  No acute calvarial abnormality. Visualized paranasal sinuses and mastoids clear.  Orbital soft tissues unremarkable.  IMPRESSION: No acute intracranial abnormality.  Critical Value/emergent results were called by telephone at the time of interpretation on 11/14/2011  at 12:28 p.m.  to  Dr. Weldon Inches, who verbally acknowledged these results.  Original Report Authenticated By: Cyndie Chime, M.D.    Medications: I have reviewed the patient's current medications.   Patient Active Hospital Problem List:  Dilantin toxicity (11/14/2011) Continue to hold dilantin. Continue with support care. Continue with IV fluids.   LFT normal. Level at 24 today.   Dehydration (11/14/2011)  Continue with IV fluids.   Dysarthria (11/14/2011) Likely secondary to dilantin toxicity. Speech better. Appreciate neuro help. CT head negative.   Hypothermia (11/14/2011)  Continue with support care.   Hypothyroidism (11/14/2011) Continue with synthroid. I will check TSH.   HTN (hypertension) (11/14/2011) Continue with metoprolol, benazepril.  Seizure disorder (11/14/2011) Continue to hold dilantin. Continue with Keppra,   Left lower extremity redness: mild redness, monitor for cellulitis. I will start local mupirocin.  If worsening redness, I will consider Doxy.   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 I will check culture. Check HIV, RPR.    LOS: 1 day   Shiv Shuey M.D.  [facility] 11/15/2011, 7:45 AM

## 2011-11-15 NOTE — Progress Notes (Signed)
Pt had requested a chaplain.  I visited briefly with patient who told me about some of his family concerns and spiritual needs.  I received a trauma page and asked the patient if he would like to speak with another chaplain later. I passed on the information to another chaplain.  Thornell Sartorius 1:38 PM  11/15/11 1300  Clinical Encounter Type  Visited With Patient  Visit Type Initial

## 2011-11-15 NOTE — Progress Notes (Signed)
I stopped by today to see [name]. The patient was on 300 mg of Dilantin twice daily prior to admission. The patient is still running toxic levels of Dilantin. Upon discharge, the patient should be on 400 mg of Dilantin, tapering by 100 mg every 2 weeks until he is off this medication. The Keppra was recently added to replace the Dilantin. I have discussed this with the patient. I will follow this patient following discharge. 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

## 2011-11-16 LAB — PHENYTOIN LEVEL, TOTAL: Phenytoin Lvl: 25.9 ug/mL — ABNORMAL HIGH (ref 10.0–20.0)

## 2011-11-16 LAB — T4, FREE: Free T4: 1.21 ng/dL (ref 0.80–1.80)

## 2011-11-16 NOTE — Progress Notes (Signed)
Followed up with pt who mentioned not only family concerns but also some financial concerns and feelings of being isolated because of his condition.    Offered emotional support and prayer.  Thornell Sartorius 10:56 AM  11/16/11 1000  Clinical Encounter Type  Visited With Patient  Visit Type Follow-up  Spiritual Encounters  Spiritual Needs Prayer;Emotional  Stress Factors  Patient Stress Factors Family relationships  Family Stress Factors Financial concerns

## 2011-11-16 NOTE — Progress Notes (Signed)
Subjective: Awake, oriented, feeling well. Speech clear.  Objective: Filed Vitals:   11/15/11 2037 11/16/11 0051 11/16/11 0427 11/16/11 0600  BP: 137/73 97/65 115/60   Pulse: 74 58 52   Temp: 97.4 F (36.3 C) 98.1 F (36.7 C) 94.7 F (34.8 C)   TempSrc: Oral Oral Axillary   Resp: 13 10 9    Height:      Weight:    133.1 kg (293 lb 6.9 oz)  SpO2: 95% 94% 99%    Weight change: -1.9 kg (-4 lb 3 oz)  Intake/Output Summary (Last 24 hours) at 11/16/11 0853 Last data filed at 11/16/11 0600  Gross per 24 hour  Intake   1470 ml  Output   3150 ml  Net  -1680 ml    General: Alert, awake, oriented x3, in no acute distress.  HEENT: No bruits, no goiter.  Heart: Regular rate and rhythm, without murmurs, rubs, gallops.  Lungs: Crackles left side, bilateral air movement.  Abdomen: Soft, nontender, nondistended, positive bowel sounds.  Neuro: Grossly intact, nonfocal.   Lab Results:  Basename 11/15/11 0500 11/14/11 2010 11/14/11 1256  NA 142 -- 142  K 4.3 -- 4.4  CL 108 -- 106  CO2 26 -- 26  GLUCOSE 85 -- 85  BUN 22 -- 23  CREATININE 0.97 0.93 --  CALCIUM 9.2 -- 10.2  MG -- -- --  PHOS -- -- --    Basename 11/15/11 0500 11/14/11 1256  AST 21 29  ALT 18 25  ALKPHOS 170* 190*  BILITOT 0.2* 0.2*  PROT 6.6 7.9  ALBUMIN 3.0* 3.9   No results found for this basename: LIPASE:2,AMYLASE:2 in the last 72 hours  Basename 11/15/11 0500 11/14/11 2010 11/14/11 1256  WBC 3.3* 5.7 --  NEUTROABS -- -- 2.8  HGB 12.3* 13.1 --  HCT 37.8* 39.3 --  MCV 93.8 92.9 --  PLT 151 175 --    Basename 11/14/11 1257  CKTOTAL 81  CKMB 5.5*  CKMBINDEX --  TROPONINI <0.30   No components found with this basename: POCBNP:3 No results found for this basename: DDIMER:2 in the last 72 hours No results found for this basename: HGBA1C:2 in the last 72 hours No results found for this basename: CHOL:2,HDL:2,LDLCALC:2,TRIG:2,CHOLHDL:2,LDLDIRECT:2 in the last 72 hours  Basename 11/15/11 0935  TSH  0.076*  T4TOTAL --  T3FREE --  THYROIDAB --   No results found for this basename: VITAMINB12:2,FOLATE:2,FERRITIN:2,TIBC:2,IRON:2,RETICCTPCT:2 in the last 72 hours  Micro Results: Recent Results (from the past 240 hour(s))  MRSA PCR SCREENING     Status: Normal   Collection Time   11/14/11  6:23 PM      Component Value Range Status Comment   MRSA by PCR NEGATIVE  NEGATIVE  Final     Studies/Results: Ct Head Wo Contrast  11/14/2011  *RADIOLOGY REPORT*  Clinical Data: Slurred speech.  CT HEAD WITHOUT CONTRAST  Technique:  Contiguous axial images were obtained from the base of the skull through the vertex without contrast.  Comparison: None.  Findings: No acute intracranial abnormality.  Specifically, no hemorrhage, hydrocephalus, mass lesion, acute infarction, or significant intracranial injury.  No acute calvarial abnormality. Visualized paranasal sinuses and mastoids clear.  Orbital soft tissues unremarkable.  IMPRESSION: No acute intracranial abnormality.  Critical Value/emergent results were called by telephone at the time of interpretation on 11/14/2011  at 12:28 p.m.  to  Dr. Weldon Inches, who verbally acknowledged these results.  Original Report Authenticated By: Cyndie Chime, M.D.    Medications: I have  reviewed the patient's current medications.  Patient Active Hospital Problem List:   Dilantin toxicity (11/14/2011) Continue to hold dilantin. Continue with support care.  LFT normal. Repeat dilantin level. Will start dilantin when level be at therapeutic range per dr Anne Hahn recommendation. See Dr Anne Hahn note 12-24.   Dehydration (11/14/2011) NSL fluids. Patient eating and drinking.   Dysarthria (11/14/2011) Likely secondary to dilantin toxicity. resolved. Appreciate neuro help. CT head negative.  Tylenol and salicylates level negative.   Hypothermia (11/14/2011) Continue with support care. Keep in step down due to hypothermia.   Hypothyroidism (11/14/2011) Continue with  synthroid. TSH low, I will check free T3 and T4. I will adjust synthroid as needed.   HTN (hypertension) (11/14/2011) Continue with metoprolol, benazepril.   Cervical Myelopathy: I will get MRI cervical spine per Dr Anne Hahn recommendation.    Seizure disorder (11/14/2011) Continue to hold dilantin. Continue with Keppra,  Left lower extremity redness: mild redness, monito. I will start local mupirocin.  Redness decreased.  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 Culture pending. HIV non reactive, RPR non reactive.          LOS: 2 days   Jakera Beaupre M.D.  [facility] 11/16/2011, 8:53 AM

## 2011-11-17 ENCOUNTER — Inpatient Hospital Stay (HOSPITAL_COMMUNITY): Payer: 59

## 2011-11-17 LAB — BASIC METABOLIC PANEL
CO2: 27 mEq/L (ref 19–32)
Glucose, Bld: 85 mg/dL (ref 70–99)
Potassium: 6.1 mEq/L — ABNORMAL HIGH (ref 3.5–5.1)
Sodium: 138 mEq/L (ref 135–145)

## 2011-11-17 LAB — CBC
Hemoglobin: 11.6 g/dL — ABNORMAL LOW (ref 13.0–17.0)
Platelets: 152 10*3/uL (ref 150–400)
RBC: 3.84 MIL/uL — ABNORMAL LOW (ref 4.22–5.81)
WBC: 3.4 10*3/uL — ABNORMAL LOW (ref 4.0–10.5)

## 2011-11-17 LAB — POTASSIUM: Potassium: 4.4 mEq/L (ref 3.5–5.1)

## 2011-11-17 MED ORDER — DOCUSATE SODIUM 100 MG PO CAPS
100.0000 mg | ORAL_CAPSULE | Freq: Two times a day (BID) | ORAL | Status: DC
  Administered 2011-11-17 – 2011-11-24 (×15): 100 mg via ORAL
  Filled 2011-11-17 (×16): qty 1

## 2011-11-17 MED ORDER — PNEUMOCOCCAL VAC POLYVALENT 25 MCG/0.5ML IJ INJ
0.5000 mL | INJECTION | INTRAMUSCULAR | Status: AC
  Administered 2011-11-17: 0.5 mL via INTRAMUSCULAR
  Filled 2011-11-17: qty 0.5

## 2011-11-17 MED ORDER — POLYETHYLENE GLYCOL 3350 17 G PO PACK
17.0000 g | PACK | Freq: Two times a day (BID) | ORAL | Status: DC
  Administered 2011-11-17 – 2011-11-24 (×15): 17 g via ORAL
  Filled 2011-11-17 (×16): qty 1

## 2011-11-17 NOTE — Progress Notes (Signed)
   CARE MANAGEMENT NOTE 11/17/2011  Patient:  [name], [name]   Account Number:  [id number]  Date Initiated:  11/15/2011  Documentation initiated by:  Donn Pierini  Subjective/Objective Assessment:   Pt admitted with Dilantin toxicity     Action/Plan:   PTA pt lived at home was independent with ADLs   Anticipated DC Date:  11/17/2011   Anticipated DC Plan:  HOME/SELF CARE  In-house referral  Clinical Social Worker      DC Planning Services  CM consult      Choice offered to / List presented to:             Status of service:  In process, will continue to follow Medicare Important Message given?   (If response is "NO", the following Medicare IM given date fields will be blank) Date Medicare IM given:   Date Additional Medicare IM given:    Discharge Disposition:    Per UR Regulation:  Reviewed for med. necessity/level of care/duration of stay  Comments:  PCP- Ross  11/17/11- 1400- Donn Pierini RN, BSN [phone] Per PT/OT notes recommending SNF for rehab, CSW consulted for placement needs.

## 2011-11-17 NOTE — Progress Notes (Signed)
Occupational Therapy Evaluation Patient Details Name: Joel REASONS MRN: [id number] DOB: September 19, 1951 Today's Date: 11/17/2011 11:12-12:07 evII Problem List:  Patient Active Problem List  Diagnoses  . Dilantin toxicity  . Dehydration  . Dysarthria  . Hypothermia  . Hypothyroidism  . HTN (hypertension)  . Seizure disorder    Past Medical History:  Past Medical History  Diagnosis Date  . Thyroid disease   . Seizures   . Hypertension   . High cholesterol   . Hypothyroidism   . Seizure disorder   . Sleep apnea, obstructive   . Sleep apnea, obstructive    Past Surgical History:  Past Surgical History  Procedure Date  . Eyes 1996    rt eye x4 times,    OT Assessment/Plan/Recommendation OT Assessment Clinical Impression Statement: Pt pleasant and cooperative admitted for increased dilantin toxicity.  Also has a history of cervical myopathy and compression.  Both are currently affecting pt's dependency with ADLs.  Overall he performs simulated selfcare tasks at a min assist level.  Feel he will benefit from comprehensive acute OT to help increase incependence thru strengthening and use of appropriate AE and DME.  Feel pt will benefit from short tern SNF for follow-up rehab since his wife cannot really assist him at home.   OT Recommendation/Assessment: Patient will need skilled OT in the acute care venue OT Problem List: Decreased strength;Impaired balance (sitting and/or standing);Decreased coordination;Decreased knowledge of use of DME or AE;Impaired UE functional use;Increased edema Barriers to Discharge: Decreased caregiver support OT Therapy Diagnosis : Generalized weakness OT Plan OT Frequency: Min 2X/week OT Treatment/Interventions: Self-care/ADL training;DME and/or AE instruction;Therapeutic exercise;Therapeutic activities;Neuromuscular education;Patient/family education;Balance training OT Recommendation Follow Up Recommendations: Skilled nursing facility Equipment  Recommended: None recommended by OT Individuals Consulted Consulted and Agree with Results and Recommendations: Patient OT Goals Acute Rehab OT Goals OT Goal Formulation: With patient Time For Goal Achievement: 2 weeks ADL Goals Pt Will Perform Grooming: with supervision;Standing at sink ADL Goal: Grooming - Progress: Progressing toward goals Pt Will Perform Lower Body Bathing: with supervision;Sit to stand from bed;with adaptive equipment ADL Goal: Lower Body Bathing - Progress: Progressing toward goals Pt Will Perform Upper Body Dressing: with modified independence;Sitting, bed;with adaptive equipment ADL Goal: Upper Body Dressing - Progress: Progressing toward goals Pt Will Perform Lower Body Dressing: with supervision;Sit to stand from bed;with adaptive equipment ADL Goal: Lower Body Dressing - Progress: Progressing toward goals Pt Will Transfer to Toilet: with supervision;with DME;3-in-1 ADL Goal: Toilet Transfer - Progress: Progressing toward goals Pt Will Perform Toileting - Clothing Manipulation: with supervision;with adaptive equipment;Sitting on 3-in-1 or toilet ADL Goal: Toileting - Clothing Manipulation - Progress: Progressing toward goals Pt Will Perform Toileting - Hygiene: with supervision;Sit to stand from 3-in-1/toilet;with adaptive equipment ADL Goal: Toileting - Hygiene - Progress: Progressing toward goals Miscellaneous OT Goals Miscellaneous OT Goal #1: Pt will increase RUE strength to 4/5  throughout for greater functional use with selfcare tasks. OT Goal: Miscellaneous Goal #1 - Progress: Progressing toward goals  OT Evaluation Precautions/Restrictions  Precautions Precautions: Fall Required Braces or Orthoses: No Restrictions Weight Bearing Restrictions: No Prior Functioning Home Living Lives With: Spouse Receives Help From: Family (Wife manic depressive at present; wife does his meds) Type of Home: House Home Layout: One level Home Access: Stairs to  enter Entrance Stairs-Rails: Left Entrance Stairs-Number of Steps: 3 Bathroom Shower/Tub: Health visitor: Handicapped height Bathroom Accessibility: No Home Adaptive Equipment: Straight cane;Shower chair with back;Raised toilet seat with rails;Hand-held shower hose (Lift  chair) Additional Comments: [facility] came out nurse came out to check wound on leg, PT/OT HH services Prior Function Level of Independence: Independent with basic ADLs;Independent with homemaking with ambulation;Independent with gait;Independent with transfers (Pt required the use of a cane for mobility.) Driving: No (wife drives and pt trying to sign up for [employer]) Vocation: On disability ADL ADL Eating/Feeding: Performed;Modified independent Where Assessed - Eating/Feeding: Chair Grooming: Performed;Minimal assistance Grooming Details (indicate cue type and reason): Pt brushed his teeth at the sink. Where Assessed - Grooming: Standing at sink Upper Body Bathing: Simulated;Supervision/safety Where Assessed - Upper Body Bathing: Sitting, chair Lower Body Bathing: Simulated;Minimal assistance Where Assessed - Lower Body Bathing: Sit to stand from bed Upper Body Dressing: Simulated;Minimal assistance Upper Body Dressing Details (indicate cue type and reason): Pt requires assistance with tying. Where Assessed - Upper Body Dressing: Unsupported;Sitting, bed Lower Body Dressing: Performed;Moderate assistance Lower Body Dressing Details (indicate cue type and reason): Pt required mod assistance to donn gripper socks Where Assessed - Lower Body Dressing: Sit to stand from bed Toilet Transfer: Simulated;Minimal assistance Toilet Transfer Method: Ambulating Toilet Transfer Equipment: Raised toilet seat with arms (or 3-in-1 over toilet) Toileting - Clothing Manipulation: Simulated;Minimal assistance Where Assessed - Toileting Clothing Manipulation: Sit to stand from 3-in-1 or toilet Toileting - Hygiene:  Simulated;Moderate assistance Toileting - Hygiene Details (indicate cue type and reason): Pt unable to use the RUE for toilet hygeine. Where Assessed - Toileting Hygiene: Sit to stand from 3-in-1 or toilet Tub/Shower Transfer: Not assessed Tub/Shower Transfer Method: Not assessed ADL Comments: Pt with increased fluid in digits and LEs impacting flexibility and coordination.  May benefit from AE to assist with LB selfcare. 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 per report.) Patient Visual Report: No change from baseline Vision - Assessment Eye Alignment: Within Functional Limits Vision Assessment: Vision not tested Perception Perception: Within Functional Limits Praxis Praxis: Intact Cognition Cognition Arousal/Alertness: Awake/alert Overall Cognitive Status: Appears within functional limits for tasks assessed Orientation Level: Oriented X4 Sensation/Coordination Sensation Light Touch: Impaired Detail Light Touch Impaired Details: Impaired LUE (Pt reports decreased light touch in digits of left hand.) Stereognosis: Not tested Hot/Cold: Not tested Proprioception: Not tested Coordination Gross Motor Movements are Fluid and Coordinated: Yes Fine Motor Movements are Fluid and Coordinated: No Coordination and Movement Description: Pt with bilateral decreased FM coordination, noted when attempting to remove and replace toothpaste cap as well as when attempting to tie gown. Extremity Assessment RUE Assessment RUE Assessment: Exceptions to [facility] RUE Strength RUE Overall Strength Comments: AROM WFLs for gross grasp, elbow flexion and extension.  Pt with limitations in shoulder flexion 0- 110 degress as well as internal and external rotation when coupled with shoulder extension and flexion.  Pt exhibits grossly  70 degrees external rotation with shoulder flexion at 90  degrees.  Internal rotation at  approximately 60 degrees with shoulder extension when attempting to reach behind his back .  Overall strength in the RUE at 3+/5. LUE Assessment LUE Assessment: Within Functional Limits (Strength 4/5 throughout.) Mobility  Bed Mobility Bed Mobility: Yes Supine to Sit: 5: Supervision;HOB elevated (Comment degrees);Other (comment) (HOB at approximately 25 degrees.) Exercises   End of Session OT - End of Session Equipment Utilized During Treatment: Gait belt Activity Tolerance: Patient tolerated treatment well Patient left: in chair;with call bell in reach General Behavior During Session: [facility] for tasks performed Cognition: [facility] for tasks  performed   Andrus Sharp OTR/L 11/17/2011, 12:31 PM  Pager number [phone]

## 2011-11-17 NOTE — Progress Notes (Signed)
Subjective:  Feeling well, no new complaints. He is going for MRI.   Objective: Filed Vitals:   11/17/11 0104 11/17/11 0358 11/17/11 0500 11/17/11 0824  BP: 117/76 109/60  120/70  Pulse: 67 59  66  Temp:  97.5 F (36.4 C)  97.9 F (36.6 C)  TempSrc:  Oral  Axillary  Resp: 12 8  12   Height:      Weight:   141 kg (310 lb 13.6 oz)   SpO2: 100% 97%  97%   Weight change: 7.9 kg (17 lb 6.7 oz)  Intake/Output Summary (Last 24 hours) at 11/17/11 1610 Last data filed at 11/17/11 0800  Gross per 24 hour  Intake    420 ml  Output    900 ml  Net   -480 ml    General: Alert, awake, oriented x3, in no acute distress.  HEENT: No bruits, no goiter.  Heart: Regular rate and rhythm, without murmurs, rubs, gallops.  Lungs: Crackles left side, bilateral air movement.  Abdomen: Soft, nontender, nondistended, positive bowel sounds.  Neuro: Grossly intact, nonfocal. Extremities; right lower extremity with decrease redness. 2 edema.   Lab Results:  Basename 11/17/11 0555 11/15/11 0500  NA 138 142  K 6.1* 4.3  CL 105 108  CO2 27 26  GLUCOSE 85 85  BUN 17 22  CREATININE 0.90 0.97  CALCIUM 9.0 9.2  MG -- --  PHOS -- --    Basename 11/15/11 0500 11/14/11 1256  AST 21 29  ALT 18 25  ALKPHOS 170* 190*  BILITOT 0.2* 0.2*  PROT 6.6 7.9  ALBUMIN 3.0* 3.9    Basename 11/17/11 0555 11/15/11 0500 11/14/11 1256  WBC 3.4* 3.3* --  NEUTROABS -- -- 2.8  HGB 11.6* 12.3* --  HCT 35.6* 37.8* --  MCV 92.7 93.8 --  PLT 152 151 --    Basename 11/14/11 1257  CKTOTAL 81  CKMB 5.5*  CKMBINDEX --  TROPONINI <0.30   Basename 11/16/11 0910 11/15/11 0935  TSH -- 0.076*  T4TOTAL -- --  T3FREE 2.6 --  THYROIDAB -- --   Micro Results: Recent Results (from the past 240 hour(s))  MRSA PCR SCREENING     Status: Normal   Collection Time   11/14/11  6:23 PM      Component Value Range Status Comment   MRSA by PCR NEGATIVE  NEGATIVE  Final     Studies/Results: No results  found.  Medications: I have reviewed the patient's current medications.    Dilantin toxicity (11/14/2011) Continue to hold dilantin. Continue with support care.  LFT normal. Repeat dilantin level today at 25. Will start dilantin when level be at therapeutic range per dr Anne Hahn recommendation. See Dr Anne Hahn note 12-24.   Dehydration (11/14/2011) Resolved. Patient eating and drinking. NSL.  Dysarthria (11/14/2011) Likely secondary to dilantin toxicity. resolved. Appreciate neuro help. CT head negative.  Tylenol and salicylates level negative. Continue to hold dilantin.   Hypothermia (11/14/2011) Continue with support care. Resolved.   Hypothyroidism (11/14/2011) Continue with synthroid. TSH low, but T3 and T4 normal range. Continue with same dose of synthroid.   HTN (hypertension) (11/14/2011) Continue with metoprolol, Hold benazepril due to hyperkalemia ?  Cervical Myelopathy:  For MRI cervical spine today  per Dr Anne Hahn recommendation.   Hyperkalemia: Probably hemolysis. Repeat K level.  Left lower extremity redness: mild redness. I will start local mupirocin.  Redness decreased.   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 Culture pending. HIV non reactive, RPR  non reactive.   Seizure disorder (11/14/2011) Continue to hold dilantin. Continue with Keppra, and phenobabital.  Pt consult,   LOS: 3 days   Shantae Vantol M.D.  [facility] 11/17/2011, 9:24 AM

## 2011-11-18 ENCOUNTER — Encounter (HOSPITAL_COMMUNITY): Payer: Self-pay | Admitting: General Practice

## 2011-11-18 LAB — HERPES SIMPLEX VIRUS CULTURE: Culture: NOT DETECTED

## 2011-11-18 MED ORDER — BISACODYL 10 MG RE SUPP
10.0000 mg | Freq: Once | RECTAL | Status: AC
  Administered 2011-11-18: 10 mg via RECTAL
  Filled 2011-11-18: qty 1

## 2011-11-18 MED ORDER — FUROSEMIDE 40 MG PO TABS
40.0000 mg | ORAL_TABLET | Freq: Every day | ORAL | Status: DC
  Administered 2011-11-19 – 2011-11-20 (×2): 40 mg via ORAL
  Filled 2011-11-18 (×3): qty 1

## 2011-11-18 MED ORDER — FUROSEMIDE 10 MG/ML IJ SOLN
40.0000 mg | Freq: Once | INTRAMUSCULAR | Status: AC
  Administered 2011-11-18: 40 mg via INTRAVENOUS
  Filled 2011-11-18: qty 4

## 2011-11-18 NOTE — Progress Notes (Signed)
Physical Therapy Treatment Patient Details Name: [name] MRN: [id number] DOB: 17-Sep-1951 Today's Date: 11/18/2011  PT Assessment/Plan  PT - Assessment/Plan Comments on Treatment Session: Patient is progressing with mobility.  Continues with some cues for safety awareness with ambulation.   PT Plan: Discharge plan remains appropriate;Frequency remains appropriate PT Frequency: Min 3X/week Follow Up Recommendations: Skilled nursing facility;24 hour supervision/assistance Equipment Recommended: Defer to next venue PT Goals  Acute Rehab PT Goals PT Goal Formulation: With patient PT Goal: Supine/Side to Sit - Progress: Other (comment) PT Goal: Sit to Stand - Progress: Progressing toward goal PT Transfer Goal: Bed to Chair/Chair to Bed - Progress: Progressing toward goal PT Goal: Ambulate - Progress: Progressing toward goal PT Goal: Perform Home Exercise Program - Progress: Progressing toward goal  PT Treatment Precautions/Restrictions  Precautions Precautions: Fall Required Braces or Orthoses: No Restrictions Weight Bearing Restrictions: No Mobility (including Balance) Bed Mobility Rolling Left: Not tested (comment) Left Sidelying to Sit: Not tested (comment) Supine to Sit: Not tested (comment) Sitting - Scoot to Edge of Bed: Not tested (comment) Transfers Sit to Stand: 1: +2 Total assist;Patient percentage (comment);From elevated surface;With upper extremity assist;With armrests;From chair/3-in-1 (pt = 70%) Sit to Stand Details (indicate cue type and reason): Pt. sitting on bedside commode.  Took 2 attempts for pt. to perform sit to stand secondary to girth and pt. needed cues for foot placement.  Pt. uses momentum to perform sit to stand.  Total assist to wipe bottom.   Stand to Sit: 1: +2 Total assist;Patient percentage (comment);With upper extremity assist;With armrests;To chair/3-in-1 (pt = 50%) Stand to Sit Details: Pt. with uncontrolled descent into chair. Stand  Pivot Transfers: Not tested (comment) Ambulation/Gait Ambulation/Gait: Yes Ambulation/Gait Assistance: 4: Min assist Ambulation/Gait Assistance Details (indicate cue type and reason): max verbal and tactile cues for pt. to stay close to RW and for appropriate step length and steering of RW.  Pt. with notable knee instability but did not buckle.  Occasional slight left knee hyperextension noted.  Ambulation Distance (Feet): 200 Feet Assistive device: Rolling walker Gait Pattern: Step-through pattern;Decreased stride length;Lateral trunk lean to right;Ataxic Stairs: No Wheelchair Mobility Wheelchair Mobility: No  Posture/Postural Control Posture/Postural Control: No significant limitations Static Standing Balance Static Standing - Balance Support: Bilateral upper extremity supported;During functional activity Static Standing - Level of Assistance: 4: Min assist Static Standing - Comment/# of Minutes: 3 Exercise    End of Session PT - End of Session Equipment Utilized During Treatment: Gait belt Activity Tolerance: Patient tolerated treatment well Patient left: in chair;with call bell in reach Nurse Communication: Mobility status for ambulation General Behavior During Session: [facility] for tasks performed Cognition: [facility] for tasks performed  INGOLD,Daneya Hartgrove 11/18/2011, 12:47 PM  [facility] Acute [facility] [phone] (pager)

## 2011-11-18 NOTE — Progress Notes (Signed)
Subjective:  C/o constipation, multiple joint pains and edema  Objective: Filed Vitals:   11/18/11 1049 11/18/11 1100 11/18/11 1300 11/18/11 1500  BP: 146/80 153/81 169/88 169/89  Pulse: 67 66 68 68  Temp:  97.9 F (36.6 C)    TempSrc:  Oral    Resp:  15 19 16   Height:      Weight:      SpO2:  96% 98% 99%   Weight change: -3 kg (-6 lb 9.8 oz)  Intake/Output Summary (Last 24 hours) at 11/18/11 1625 Last data filed at 11/18/11 1300  Gross per 24 hour  Intake   1380 ml  Output   4200 ml  Net  -2820 ml    General: Alert, awake, oriented x3, in no acute distress.   Heart: Regular rate and rhythm, without murmurs, rubs, gallops.  Lungs: Crackles left side, good bilateral air movement.  Abdomen: Soft, nontender, nondistended, positive bowel sounds.  Neuro: Grossly intact, nonfocal. Extremities; right lower extremity with decrease redness. 2 + edema. All 4 extremities  Lab Results:  [facility] 11/17/11 0915 11/17/11 0555  NA -- 138  K 4.4 6.1*  CL -- 105  CO2 -- 27  GLUCOSE -- 85  BUN -- 17  CREATININE -- 0.90  CALCIUM -- 9.0  MG -- --  PHOS -- --    Basename 11/17/11 0555  WBC 3.4*  NEUTROABS --  HGB 11.6*  HCT 35.6*  MCV 92.7  PLT 152    Basename 11/16/11 0910  TSH --  T4TOTAL --  T3FREE 2.6  THYROIDAB --   Micro Results: Recent Results (from the past 240 hour(s))  MRSA PCR SCREENING     Status: Normal   Collection Time   11/14/11  6:23 PM      Component Value Range Status Comment   MRSA by PCR NEGATIVE  NEGATIVE  Final   HERPES SIMPLEX VIRUS CULTURE     Status: Normal   Collection Time   11/15/11  8:30 AM      Component Value Range Status Comment   Specimen Description OTHER   Final    Special Requests PENILE   Final    Culture No Herpes Simplex Virus detected.   Final    Report Status 11/18/2011 FINAL   Final     Studies/Results: Mr Cervical Spine Wo Contrast  11/17/2011  *RADIOLOGY REPORT*  Clinical Data: Cervical myelopathy.  Leg  weakness and right shoulder pain.  MRI CERVICAL SPINE WITHOUT CONTRAST  Technique:  Multiplanar and multiecho pulse sequences of the cervical spine, to include the craniocervical junction and cervicothoracic junction, were obtained according to standard protocol without intravenous contrast.  Comparison: Prior study 03/23/2011.  Findings: There are postoperative changes with decompressive laminectomies extending from C3-C5.  No complicating features.  The cervical spinal cord demonstrates a small area of increased T2 signal intensity at C5-6 which I do not see for certain on the prior MRI.  It may be a small focus of cord edema or myelomalacia.  C2-3:  No significant findings.  C3-4:  Stable degenerative anterior subluxation of C4.  Posterior decompressive laminectomy without spinal stenosis.  There is mild foraminal stenosis bilaterally due to uncinate spurring and facet disease.  C4-5:  Decompressive laminectomy.  No spinal stenosis.  Mild foraminal stenosis bilaterally.  C5-6:  Focal central disc protrusion, facet disease and short pedicles contribute to moderate spinal stenosis with obliteration of the ventral CSF space and bilateral foraminal stenosis.  C6-7:  Diffuse bulging degenerated annulus,  osteophytic ridging and uncinate spurring contributing to mild foraminal stenosis bilaterally.  No significant spinal stenosis.  C7-T1:  Broad-based left paracentral disc protrusion with mass effect on the left side of the thecal sac.  Mild left foraminal stenosis also.  IMPRESSION:  1.  Advanced degenerative cervical spondylosis for age. 2.  Postoperative changes from C3-C5 with decompressive laminectomy. 3.  Focal central disc protrusion at C5-6 along with short pedicles and facet disease contributing to moderate spinal stenosis and bilateral foraminal stenosis. There is also a focus of cord edema or myelomalacia. 4.  Broad-based left paracentral disc protrusion at C7-T1, not significantly changed.  Original Report  Authenticated By: P. Loralie Champagne, M.D.    Medications: I have reviewed the patient's current medications.    Dilantin toxicity (11/14/2011) - resolved   Continue with support care.  LFT normal.Will start dilantin when level is at therapeutic range per dr Anne Hahn recommendation. See Dr Anne Hahn note 12-24.   Dehydration (11/14/2011) Resolved. Patient eating and drinking. NSL.  Dysarthria (11/14/2011) Likely secondary to dilantin toxicity. resolved. Appreciate neuro help. CT head negative.  Tylenol and salicylates level negative.   Hypothermia (11/14/2011). Resolved.   Hypothyroidism (11/14/2011) Continue with synthroid. TSH low, but T3 and T4 normal range. Continue with same dose of synthroid.   HTN (hypertension) (11/14/2011) Continue with metoprolol,will add lasix due to severe edema  Cervical Myelopathy: - stable  MRI cervical spine with chronic changes -  Left lower extremity redness: mild redness. c/w local mupirocin.  Redness decreased.   Genital Herpes: valtrex for 10 days. Culture pending. HIV non reactive, RPR non reactive.   Seizure disorder (11/14/2011)  Continue with Keppra, and phenobabital. dilantin per pharmacy   SNF placement Pt consult,   LOS: 4 days   Elliette Seabolt M.D.  [facility] 11/18/2011, 4:25 PM

## 2011-11-18 NOTE — Progress Notes (Signed)
MEDICATION RELATED CONSULT NOTE - INITIAL   Pharmacy Consult:  Dilantin Indication:  seizure  Allergies  Allergen Reactions  . Latex Rash    Hands rash and  mild hives    Patient Measurements: Height: 6\' 1"  (185.4 cm) Weight: 304 lb 3.8 oz (138 kg) IBW/kg (Calculated) : 79.9   Vital Signs: Temp: 97.9 F (36.6 C) (12/27 1100) Temp src: Oral (12/27 1100) BP: 169/89 mmHg (12/27 1500) Pulse Rate: 68  (12/27 1500) Intake/Output from previous day: 12/26 0701 - 12/27 0700 In: 1200 [P.O.:1200] Out: 3900 [Urine:3900] Intake/Output from this shift: Total I/O In: 900 [P.O.:900] Out: 1300 [Urine:1300]  Labs:  [facility] 11/17/11 0555  WBC 3.4*  HGB 11.6*  HCT 35.6*  PLT 152  APTT --  CREATININE 0.90  LABCREA --  CREATININE 0.90  CREAT24HRUR --  MG --  PHOS --  ALBUMIN --  PROT --  ALBUMIN --  AST --  ALT --  ALKPHOS --  BILITOT --  BILIDIR --  IBILI --   Estimated Creatinine Clearance: 130.5 ml/min (by C-G formula based on Cr of 0.9).   Microbiology: Recent Results (from the past 720 hour(s))  MRSA PCR SCREENING     Status: Normal   Collection Time   11/14/11  6:23 PM      Component Value Range Status Comment   MRSA by PCR NEGATIVE  NEGATIVE  Final   HERPES SIMPLEX VIRUS CULTURE     Status: Normal   Collection Time   11/15/11  8:30 AM      Component Value Range Status Comment   Specimen Description OTHER   Final    Special Requests PENILE   Final    Culture No Herpes Simplex Virus detected.   Final    Report Status 11/18/2011 FINAL   Final     Medical History: Past Medical History  Diagnosis Date  . Thyroid disease   . Seizures   . Hypertension   . High cholesterol   . Hypothyroidism   . Seizure disorder   . Sleep apnea, obstructive   . Sleep apnea, obstructive     Admit Complaint:  Confusion, slurred speech, difficulty in movement Pharmacist System-Based Medication Review: Anticoagulation:  N/A Infectious Disease:  Valacyclovir MD #4/10  for genital herpes Cardiovascular:  HTN/HLD - ASA, Lasix, lopressor, Crestor (ACEi held d/t hyperkalemia) Endocrinology:  Hypothyroid on Synthroid, TSH low but normal T3/T4 - no change to dose Gastrointestinal / Nutrition:  Cardiac diet Neurology:  Seizure on dilantin PTA, admitted with DPH level 25.3 mcg/mL -- corrected to 37 mcg/mL, currently on Keppra 500mg  BID + phenobarb 162mg  BID Nephrology:  No labs today, CrCL 131 ml/min Pulmonary:  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 Patient reported being on Dilantin 300mg  PO BID, then the dose was reduced to 100mg  PO BID on Friday 11/12/11 prior to his admission.  Patient also stated that his physician planned to take him off of Dilantin completely.  Dilantin level on 11/16/11 was 25.3, which corrected to 37 mcg/mL.  Elevated dilantin level is a reflection of the 300mg  PO BID dose.  Expect level to remain supratherapeutic, so will hold off on rechecking a level today.    Goal of Therapy:  Corrected dilantin level = 10-20 mcg/mL  Plan:  - Will continue to hold Dilantin - Recheck dilantin level/CMET on Saturday AM - F/U with plans  whether Dilantin should be resumed.   Phillips Climes, PharmD 11/18/2011,3:39 PM

## 2011-11-18 NOTE — Progress Notes (Signed)
Pt placed on CPAP for the night and is tolerating well.  Pt encouraged to notify RN/ RT of any concerns.

## 2011-11-18 NOTE — Progress Notes (Signed)
Pt transferred to [id number]. Report given to Olu, Charity fundraiser.

## 2011-11-19 LAB — BASIC METABOLIC PANEL WITH GFR
BUN: 15 mg/dL (ref 6–23)
CO2: 29 meq/L (ref 19–32)
Calcium: 9.6 mg/dL (ref 8.4–10.5)
Chloride: 102 meq/L (ref 96–112)
Creatinine, Ser: 0.97 mg/dL (ref 0.50–1.35)
GFR calc Af Amer: >90 mL/min
GFR calc non Af Amer: 89 mL/min — ABNORMAL LOW
Glucose, Bld: 85 mg/dL (ref 70–99)
Potassium: 4.4 meq/L (ref 3.5–5.1)
Sodium: 138 meq/L (ref 135–145)

## 2011-11-19 MED ORDER — PHENOBARBITAL 32.4 MG PO TABS
162.0000 mg | ORAL_TABLET | Freq: Every day | ORAL | Status: DC
  Administered 2011-11-19 – 2011-11-23 (×5): 162 mg via ORAL
  Filled 2011-11-19: qty 5
  Filled 2011-11-19: qty 4
  Filled 2011-11-19: qty 5
  Filled 2011-11-19: qty 3
  Filled 2011-11-19: qty 2
  Filled 2011-11-19: qty 5
  Filled 2011-11-19: qty 2
  Filled 2011-11-19: qty 1

## 2011-11-19 NOTE — Progress Notes (Signed)
Physical Therapy Treatment Patient Details Name: [name] MRN: [id number] DOB: 1951-09-14 Today's Date: 11/19/2011  PT Assessment/Plan  PT - Assessment/Plan Comments on Treatment Session: Pt continues to progress with mobility and ambulation, but requires cues for safety and technique. Pt will be able to progress to standing ther ex for further activity tolerance and strength.  PT Plan: Discharge plan remains appropriate;Frequency remains appropriate PT Frequency: Min 3X/week Follow Up Recommendations: Skilled nursing facility;24 hour supervision/assistance Equipment Recommended: Defer to next venue PT Goals  Acute Rehab PT Goals PT Goal: Supine/Side to Sit - Progress: Other (comment) (Not addressed this tx) PT Goal: Sit to Stand - Progress: Progressing toward goal PT Transfer Goal: Bed to Chair/Chair to Bed - Progress: Other (comment) (Not addressed this tx) PT Goal: Ambulate - Progress: Progressing toward goal PT Goal: Perform Home Exercise Program - Progress: Progressing toward goal  PT Treatment Precautions/Restrictions  Precautions Precautions: Fall Required Braces or Orthoses: No Restrictions Weight Bearing Restrictions: No Mobility (including Balance) Bed Mobility Bed Mobility: No Transfers Transfers: Yes Sit to Stand: 2: Max assist;With upper extremity assist;From chair/3-in-1 Sit to Stand Details (indicate cue type and reason): Max A for lifting after several attempts using momentum. Once standing initially, pt lost balance posteriorly and had uncontrolled sit to chair, with A to safely lower. Cues for scooting to edge of chair. Placed pt on several pillows in chair at end of tx to raise seat height.  Stand to Sit: 2: Max assist;With upper extremity assist;With armrests Stand to Sit Details: Pt progressed from Max>Min A for stand-sit after several trials, with definite UE use.  Ambulation/Gait Ambulation/Gait: Yes Ambulation/Gait Assistance: 4: Min  assist Ambulation/Gait Assistance Details (indicate cue type and reason): Pt with decreased safety awareness with RW management. Pt makes abrupt changes with cues or to correct, leading to decreased balance. Pt given cues for posture, RW proximity to self, to leave RW on ground during turns. Pt with decreased attention to task and one time kicked the RW leg while talking with a visitor, requiring Min A  for recovery. Pt able to correct gait wtih cues, but unable to maintain.  Ambulation Distance (Feet): 200 Feet Assistive device: Rolling walker Gait Pattern: Step-through pattern;Decreased stride length;Lateral trunk lean to right;Ataxic Stairs: No Wheelchair Mobility Wheelchair Mobility: No  Posture/Postural Control Posture/Postural Control: Postural limitations (Forward flexed posture in standing) Balance Balance Assessed: No Exercise  General Exercises - Lower Extremity Ankle Circles/Pumps: 20 reps;Both;Strengthening;AROM;Seated Long Arc Quad: 20 reps;Both;Strengthening;AROM Hip ABduction/ADduction: 20 reps;Both;AROM;Strengthening Hip Flexion/Marching: Seated;Both;Strengthening;AROM (20 reps) End of Session PT - End of Session Equipment Utilized During Treatment: Gait belt General Behavior During Session: [facility] for tasks performed Cognition: [facility] for tasks performed  [employer], [place] [phone]  11/19/2011, 12:31 PM

## 2011-11-19 NOTE — Progress Notes (Signed)
Subjective:  Improved gait  Objective: Filed Vitals:   11/18/11 2000 11/18/11 2141 11/19/11 0553 11/19/11 1505  BP: 154/80 153/82 124/70 152/87  Pulse: 82 76 58 76  Temp: 97.6 F (36.4 C) 97.6 F (36.4 C) 97.7 F (36.5 C) 98.1 F (36.7 C)  TempSrc: Oral Oral Oral Oral  Resp: 23 20 20 18   Height:  6\' 1"  (1.854 m)    Weight:  133.766 kg (294 lb 14.4 oz) 133.1 kg (293 lb 6.9 oz)   SpO2: 98%  94% 94%   Weight change: -4.234 kg (-9 lb 5.4 oz)  Intake/Output Summary (Last 24 hours) at 11/19/11 1814 Last data filed at 11/19/11 1652  Gross per 24 hour  Intake    510 ml  Output   1450 ml  Net   -940 ml    General: Alert, awake, oriented x3, in no acute distress.   Heart: Regular rate and rhythm, without murmurs, rubs, gallops.  Lungs: Crackles left side, good bilateral air movement.  Abdomen: Soft, nontender, nondistended, positive bowel sounds.  Neuro: Grossly intact, nonfocal. Extremities; right lower extremity with decrease redness. 2 + edema. All 4 extremities  Lab Results:  Basename 11/19/11 0617 11/17/11 0915 11/17/11 0555  NA 138 -- 138  K 4.4 4.4 --  CL 102 -- 105  CO2 29 -- 27  GLUCOSE 85 -- 85  BUN 15 -- 17  CREATININE 0.97 -- 0.90  CALCIUM 9.6 -- 9.0  MG -- -- --  PHOS -- -- --    Basename 11/17/11 0555  WBC 3.4*  NEUTROABS --  HGB 11.6*  HCT 35.6*  MCV 92.7  PLT 152   No results found for this basename: TSH,T4TOTAL,FREET3,T3FREE,THYROIDAB in the last 72 hours Micro Results: Recent Results (from the past 240 hour(s))  MRSA PCR SCREENING     Status: Normal   Collection Time   11/14/11  6:23 PM      Component Value Range Status Comment   MRSA by PCR NEGATIVE  NEGATIVE  Final   HERPES SIMPLEX VIRUS CULTURE     Status: Normal   Collection Time   11/15/11  8:30 AM      Component Value Range Status Comment   Specimen Description OTHER   Final    Special Requests PENILE   Final    Culture No Herpes Simplex Virus detected.   Final    Report Status  11/18/2011 FINAL   Final     Studies/Results: No results found.  Medications: I have reviewed the patient's current medications.    Dilantin toxicity (11/14/2011) - resolved   Continue with support care.  LFT normal.Will start dilantin when level is at therapeutic range per dr Anne Hahn recommendation. See Dr Anne Hahn note 12-24.   Dehydration (11/14/2011) Resolved. Patient eating and drinking. NSL.  Dysarthria (11/14/2011) Likely secondary to dilantin toxicity. resolved. Appreciate neuro help. CT head negative.  Tylenol and salicylates level negative.   Hypothermia (11/14/2011). Resolved.   Edema- c/w lasix   Hypothyroidism (11/14/2011) Continue with synthroid. TSH low, but T3 and T4 normal range. Continue with same dose of synthroid.   HTN (hypertension) (11/14/2011) Continue with metoprolol,will add lasix due to severe edema  Cervical Myelopathy: - stable  MRI cervical spine with chronic changes -  Left lower extremity redness: mild redness. c/w local mupirocin.  Redness decreased.   Genital Herpes: valtrex for 10 days. Culture pending. HIV non reactive, RPR non reactive.   Seizure disorder (11/14/2011)  Continue with Keppra, and phenobabital. dilantin per  pharmacy   SNF placement Pt consult,   LOS: 5 days   Eithan Beagle M.D.  [facility] 11/19/2011, 6:14 PM

## 2011-11-19 NOTE — Progress Notes (Signed)
Clinical Social Work-please see shadow chart for full assessment-CSW initiated FL2 and bed search; coverage will provide bed offers and facilitate d/c planning-Odelle Kosier-MSW, [phone]

## 2011-11-20 DIAGNOSIS — I517 Cardiomegaly: Secondary | ICD-10-CM

## 2011-11-20 LAB — PHENYTOIN LEVEL, TOTAL: Phenytoin Lvl: 12.2 ug/mL (ref 10.0–20.0)

## 2011-11-20 LAB — COMPREHENSIVE METABOLIC PANEL
ALT: 26 U/L (ref 0–53)
Albumin: 3.3 g/dL — ABNORMAL LOW (ref 3.5–5.2)
Alkaline Phosphatase: 174 U/L — ABNORMAL HIGH (ref 39–117)
BUN: 18 mg/dL (ref 6–23)
Chloride: 102 mEq/L (ref 96–112)
Glucose, Bld: 82 mg/dL (ref 70–99)
Potassium: 4.4 mEq/L (ref 3.5–5.1)
Sodium: 139 mEq/L (ref 135–145)
Total Bilirubin: 0.3 mg/dL (ref 0.3–1.2)

## 2011-11-20 MED ORDER — METOLAZONE 5 MG PO TABS
5.0000 mg | ORAL_TABLET | Freq: Every day | ORAL | Status: DC
  Administered 2011-11-20 – 2011-11-23 (×4): 5 mg via ORAL
  Filled 2011-11-20 (×4): qty 1

## 2011-11-20 MED ORDER — FUROSEMIDE 10 MG/ML IJ SOLN
40.0000 mg | Freq: Once | INTRAMUSCULAR | Status: AC
  Administered 2011-11-20: 40 mg via INTRAVENOUS
  Filled 2011-11-20: qty 4

## 2011-11-20 MED ORDER — PHENYTOIN SODIUM EXTENDED 100 MG PO CAPS: 200.0000 mg | ORAL_CAPSULE | Freq: Every day | ORAL | Status: DC

## 2011-11-20 MED ORDER — PHENYTOIN SODIUM EXTENDED 100 MG PO CAPS
200.0000 mg | ORAL_CAPSULE | Freq: Two times a day (BID) | ORAL | Status: DC
  Administered 2011-11-20 – 2011-11-22 (×5): 200 mg via ORAL
  Filled 2011-11-20 (×7): qty 2

## 2011-11-20 MED ORDER — PHENYTOIN SODIUM EXTENDED 100 MG PO CAPS: 300.0000 mg | ORAL_CAPSULE | Freq: Every day | ORAL | Status: DC

## 2011-11-20 MED ORDER — FUROSEMIDE 10 MG/ML IJ SOLN
40.0000 mg | Freq: Two times a day (BID) | INTRAMUSCULAR | Status: DC
  Administered 2011-11-21 – 2011-11-23 (×6): 40 mg via INTRAVENOUS
  Filled 2011-11-20 (×7): qty 4

## 2011-11-20 MED ORDER — PHENYTOIN SODIUM EXTENDED 100 MG PO CAPS
100.0000 mg | ORAL_CAPSULE | Freq: Every day | ORAL | Status: DC
  Filled 2011-11-20: qty 1

## 2011-11-20 MED ORDER — PHENYTOIN SODIUM EXTENDED 100 MG PO CAPS: 100.0000 mg | ORAL_CAPSULE | Freq: Every day | ORAL | Status: DC

## 2011-11-20 NOTE — Progress Notes (Signed)
MEDICATION RELATED CONSULT NOTE - INITIAL   Pharmacy Consult:  Dilantin Indication:  Dosing/Seizure disorder  Allergies  Allergen Reactions  . Latex Rash    Hands rash and  mild hives    Patient Measurements: Height: 6\' 1"  (185.4 cm) Weight: 294 lb 1.5 oz (133.4 kg) (scale (C0) IBW/kg (Calculated) : 79.9   Vital Signs: Temp: 99.5 F (37.5 C) (12/29 0550) BP: 127/77 mmHg (12/29 0550) Pulse Rate: 66  (12/29 0550) Intake/Output from previous day: 12/28 0701 - 12/29 0700 In: 1420 [P.O.:1420] Out: 750 [Urine:750] Intake/Output from this shift: Total I/O In: 300 [P.O.:300] Out: 1100 [Urine:1100]  Labs:  Basename 11/20/11 0600 11/19/11 0617  WBC -- --  HGB -- --  HCT -- --  PLT -- --  APTT -- --  CREATININE 1.00 0.97  LABCREA -- --  CREATININE 1.00 0.97  CREAT24HRUR -- --  MG -- --  PHOS -- --  ALBUMIN 3.3* --  PROT 7.1 --  ALBUMIN 3.3* --  AST 32 --  ALT 26 --  ALKPHOS 174* --  BILITOT 0.3 --  BILIDIR -- --  IBILI -- --   Estimated Creatinine Clearance: 115.4 ml/min (by C-G formula based on Cr of 1).   Microbiology: Recent Results (from the past 720 hour(s))  MRSA PCR SCREENING     Status: Normal   Collection Time   11/14/11  6:23 PM      Component Value Range Status Comment   MRSA by PCR NEGATIVE  NEGATIVE  Final   HERPES SIMPLEX VIRUS CULTURE     Status: Normal   Collection Time   11/15/11  8:30 AM      Component Value Range Status Comment   Specimen Description OTHER   Final    Special Requests PENILE   Final    Culture No Herpes Simplex Virus detected.   Final    Report Status 11/18/2011 FINAL   Final     Medical History: Past Medical History  Diagnosis Date  . Hypertension   . High cholesterol   . Hypothyroidism   . Seizure disorder   . Sleep apnea, obstructive   . Sleep apnea, obstructive   . Asthma     "outgrew it from place I used to work at"  . Seizures 1966    "started as gran mal; lessened to no loss of consciousness nowl last  ~ 2000"    Assessment: 60yom admitted with confusion and slurred speech due to Dilantin toxicity - initial level 25.3 corrected to ~30. Dilantin level this morning was therapeutic at 12.2 mcg/ml --> corrected to 16 mcg/ml (for albumin 3.3). Per Dr. Anne Hahn (patient's neurologist), pt had been on Dilantin 300mg  twice daily PTA. Dr. Anne Hahn was in the process of transitioning patient from Dilantin to Keppra. Patient is currently on Keppra 500mg  twice daily and will be titrated up as Dilantin is titrated down. Per Dr. Anne Hahn note on 12/24, once Dilantin levels are with therapeutic range, patient should be restarted on Dilantin 400mg  daily and tapered by 100mg  every 2 weeks.  Spoke with Dr. Lavera Guise as well, OK to go ahead with restart of Dilantin per Dr. Anne Hahn' recommendations.  - AST/ALT: 32/26  Goal: Total Phenytoin Level (corrected): 10-20  Plan:  1. Restart Dilantin 200mg  BID (400mg  daily per Dr. Anne Hahn recommendation) 2. Taper Dilantin regimen per Dr. Anne Hahn recommendation (decrease total daily dose by 100mg  every 2 weeks until patient is off medicationi) 3. Follow-up with Dr. Anne Hahn concerning Keppra titration 4.  Monitor for  signs/symptoms of Dilantin toxicity  Cleon Dew, PharmD 11/20/2011   10.55 AM

## 2011-11-20 NOTE — Progress Notes (Signed)
Subjective:  Doing better.   Objective: Filed Vitals:   11/19/11 1505 11/19/11 2008 11/19/11 2137 11/20/11 0550  BP: 152/87 156/93 144/75 127/77  Pulse: 76 80 71 66  Temp: 98.1 F (36.7 C) 97.3 F (36.3 C)  99.5 F (37.5 C)  TempSrc: Oral Oral    Resp: 18 16  24   Height:      Weight:    133.4 kg (294 lb 1.5 oz)  SpO2: 94% 95%  92%   Weight change: -0.366 kg (-12.9 oz)  Intake/Output Summary (Last 24 hours) at 11/20/11 1352 Last data filed at 11/20/11 0900  Gross per 24 hour  Intake   1240 ml  Output   1250 ml  Net    -10 ml   Lungs clear  Mentation clear  LE +3 edema   Lab Results:  Basename 11/20/11 0600 11/19/11 0617  NA 139 138  K 4.4 4.4  CL 102 102  CO2 28 29  GLUCOSE 82 85  BUN 18 15  CREATININE 1.00 0.97  CALCIUM 9.7 9.6  MG -- --  PHOS -- --   Micro Results: Recent Results (from the past 240 hour(s))  MRSA PCR SCREENING     Status: Normal   Collection Time   11/14/11  6:23 PM      Component Value Range Status Comment   MRSA by PCR NEGATIVE  NEGATIVE  Final   HERPES SIMPLEX VIRUS CULTURE     Status: Normal   Collection Time   11/15/11  8:30 AM      Component Value Range Status Comment   Specimen Description OTHER   Final    Special Requests PENILE   Final    Culture No Herpes Simplex Virus detected.   Final    Report Status 11/18/2011 FINAL   Final    Results for EMRE, STOCK (MRN [id number]) as of 11/20/2011 09:06  Ref. Range 11/14/2011 12:56 11/15/2011 05:00 11/15/2011 09:35 11/16/2011 09:10 11/20/2011 06:00  Phenytoin Lvl Latest Range: 10.0-20.0 ug/mL 25.3 (H) 24.6 (H)  25.9 (H) 12.2    Medications:      . aspirin EC  81 mg Oral Daily  . docusate sodium  100 mg Oral BID  . furosemide  40 mg Intravenous Once  . furosemide  40 mg Intravenous BID WC  . heparin  5,000 Units Subcutaneous Q8H  . levETIRAcetam  500 mg Oral BID  . levothyroxine  300 mcg Oral QAC breakfast  . metolazone  5 mg Oral Daily  . metoprolol tartrate  25  mg Oral BID  . mulitivitamin with minerals  1 tablet Oral Daily  . mupirocin cream   Topical BID  . PHENObarbital  162 mg Oral q1800  . phenytoin  200 mg Oral BID   Followed by  . phenytoin  300 mg Oral Daily   Followed by  . phenytoin  200 mg Oral Daily   Followed by  . phenytoin  100 mg Oral Daily  . polyethylene glycol  17 g Oral BID  . rosuvastatin  20 mg Oral q1800  . DISCONTD: furosemide  40 mg Oral Daily  . DISCONTD: PHENObarbital  162 mg Oral q1800  . DISCONTD: phenytoin  100 mg Oral QHS  . DISCONTD: valACYclovir  1,000 mg Oral BID      Dilantin toxicity (11/14/2011) - resolved   Continue with support care.  LFT normal. Restart dilantin when level is at therapeutic range per dr Anne Hahn recommendation. See Dr Anne Hahn note 12-24.  Resumed  dilantin at 200 mg BID on 11/20/11  Dehydration (11/14/2011) Resolved. Patient eating and drinking. NSL.  Dysarthria (11/14/2011) Likely secondary to dilantin toxicity. resolved. Appreciate neuro help. CT head negative.  Tylenol and salicylates level negative.   Hypothermia (11/14/2011). Resolved.   Chronic Lower extremity Edema- check echo  c/w lasix   Hypothyroidism (11/14/2011) Continue with synthroid. TSH low, but T3 and T4 normal range. Continue with same dose of synthroid.   HTN (hypertension) (11/14/2011) Continue with metoprolol,will add lasix due to severe edema  Cervical Myelopathy: - stable  MRI cervical spine with chronic changes -  Left lower extremity redness: mild redness. c/w local mupirocin.  Redness decreased.   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 HIV non reactive, RPR non reactive.   Seizure disorder (11/14/2011)  Continue with Keppra, and phenobabital. dilantin per pharmacy   SNF placement t LOS: 6 days   Jose Alleyne M.D.  [facility] 11/20/2011, 1:52 PM

## 2011-11-20 NOTE — Progress Notes (Signed)
  Echocardiogram 2D Echocardiogram has been performed.  Nadim Malia, Real Cons 11/20/2011, 3:58 PM

## 2011-11-21 LAB — BASIC METABOLIC PANEL
CO2: 28 mEq/L (ref 19–32)
Chloride: 100 mEq/L (ref 96–112)
GFR calc non Af Amer: 89 mL/min — ABNORMAL LOW (ref >90)
Glucose, Bld: 95 mg/dL (ref 70–99)
Potassium: 4 mEq/L (ref 3.5–5.1)
Sodium: 135 mEq/L (ref 135–145)

## 2011-11-21 MED ORDER — SORBITOL 70 % SOLN
50.0000 mL | Freq: Once | Status: AC
  Administered 2011-11-21: 50 mL via ORAL
  Filled 2011-11-21: qty 60

## 2011-11-21 NOTE — Progress Notes (Signed)
Subjective:  Feels better Able to ambulate around the room with walker  Objective: Filed Vitals:   11/20/11 0550 11/20/11 2108 11/21/11 0025 11/21/11 0525  BP: 127/77 136/71  125/75  Pulse: 66 77 78 70  Temp: 99.5 F (37.5 C) 98.3 F (36.8 C)  98.1 F (36.7 C)  TempSrc:  Oral  Oral  Resp: 24 20 18 16   Height:      Weight: 133.4 kg (294 lb 1.5 oz)   133.63 kg (294 lb 9.6 oz)  SpO2: 92% 91% 92%    Weight change: 0.23 kg (8.1 oz)  Intake/Output Summary (Last 24 hours) at 11/21/11 1413 Last data filed at 11/21/11 0535  Gross per 24 hour  Intake    240 ml  Output   2700 ml  Net  -2460 ml   Cvs: rrr Rs: ctab LE +3 edema   Lab Results:  Basename 11/21/11 0616 11/20/11 0600  NA 135 139  K 4.0 4.4  CL 100 102  CO2 28 28  GLUCOSE 95 82  BUN 17 18  CREATININE 0.98 1.00  CALCIUM 9.5 9.7  MG -- --  PHOS -- --   Micro Results: Recent Results (from the past 240 hour(s))  MRSA PCR SCREENING     Status: Normal   Collection Time   11/14/11  6:23 PM      Component Value Range Status Comment   MRSA by PCR NEGATIVE  NEGATIVE  Final   HERPES SIMPLEX VIRUS CULTURE     Status: Normal   Collection Time   11/15/11  8:30 AM      Component Value Range Status Comment   Specimen Description OTHER   Final    Special Requests PENILE   Final    Culture No Herpes Simplex Virus detected.   Final    Report Status 11/18/2011 FINAL   Final    Results for Joel, [name] (MRN [id number]) as of 11/20/2011 09:06  Ref. Range 11/14/2011 12:56 11/15/2011 05:00 11/15/2011 09:35 11/16/2011 09:10 11/20/2011 06:00  Phenytoin Lvl Latest Range: 10.0-20.0 ug/mL 25.3 (H) 24.6 (H)  25.9 (H) 12.2   MRI examination of the cervical spine 1. Advanced degenerative cervical spondylosis for age.  2. Postoperative changes from C3-C5 with decompressive  laminectomy.  3. Focal central disc protrusion at C5-6 along with short pedicles  and facet disease contributing to moderate spinal stenosis and    bilateral foraminal stenosis. There is also a focus of cord edema  or myelomalacia.  4. Broad-based left paracentral disc protrusion at C7-T1, not  significantly changed.   Medications:      . aspirin EC  81 mg Oral Daily  . docusate sodium  100 mg Oral BID  . furosemide  40 mg Intravenous BID WC  . heparin  5,000 Units Subcutaneous Q8H  . levETIRAcetam  500 mg Oral BID  . levothyroxine  300 mcg Oral QAC breakfast  . metolazone  5 mg Oral Daily  . metoprolol tartrate  25 mg Oral BID  . mulitivitamin with minerals  1 tablet Oral Daily  . mupirocin cream   Topical BID  . PHENObarbital  162 mg Oral q1800  . phenytoin  200 mg Oral BID   Followed by  . phenytoin  300 mg Oral Daily   Followed by  . phenytoin  200 mg Oral Daily   Followed by  . phenytoin  100 mg Oral Daily  . polyethylene glycol  17 g Oral BID  . rosuvastatin  20 mg  Oral q1800  . sorbitol  50 mL Oral Once      Dilantin toxicity  - resolved   Continue with support care.  LFT normal. Restart dilantin when level is at therapeutic range per dr Anne Hahn recommendation. See Dr Anne Hahn note 12-24.  Resumed dilantin at 200 mg BID on 11/20/11  Dehydration  Resolved. Patient eating and drinking. NSL.  Dysarthria (11/14/2011) Likely secondary to dilantin toxicity. resolved. Appreciate neuro help. CT head negative.  Tylenol and salicylates level negative.   Hypothermia (11/14/2011). Resolved.   Chronic Lower extremity Edema- check echo  c/w lasix   Hypothyroidism (11/14/2011) Continue with synthroid. TSH low, but T3 and T4 normal range. Continue with same dose of synthroid.   HTN (hypertension) (11/14/2011) Continue with metoprolol,will add lasix due to severe edema  Cervical Myelopathy: - stable  MRI cervical spine with chronic changes -  Left lower extremity redness: mild redness. c/w local mupirocin.  Redness decreased.   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 HIV non reactive, RPR  non reactive.   Seizure disorder (11/14/2011)  Continue with Keppra, and phenobabital. dilantin per pharmacy   SNF placement t LOS: 7 days   [name] M.D.  [facility] 11/21/2011, 2:13 PM

## 2011-11-22 DIAGNOSIS — I5032 Chronic diastolic (congestive) heart failure: Secondary | ICD-10-CM | POA: Diagnosis present

## 2011-11-22 LAB — BASIC METABOLIC PANEL
BUN: 20 mg/dL (ref 6–23)
Chloride: 96 mEq/L (ref 96–112)
GFR calc Af Amer: 90 mL/min — ABNORMAL LOW (ref >90)
Glucose, Bld: 87 mg/dL (ref 70–99)
Potassium: 4.2 mEq/L (ref 3.5–5.1)

## 2011-11-22 MED ORDER — FUROSEMIDE 40 MG PO TABS: 40.0000 mg | ORAL_TABLET | Freq: Every day | ORAL | Status: DC

## 2011-11-22 MED ORDER — POLYETHYLENE GLYCOL 3350 17 G PO PACK: 17.0000 g | PACK | Freq: Two times a day (BID) | ORAL | Status: AC

## 2011-11-22 NOTE — Progress Notes (Signed)
Occupational Therapy session Patient Details Name: [name] MRN: [id number] DOB: 1951-09-13 Today's Date: 11/22/2011 No c/o pain  Time 14:00-14:45 ( ) OT Assessment/Plan OT Assessment/Plan Comments on Treatment Session: Pt felt good about ability to perform bathing and dressing tasks with increase movement and functional use of right UE; at the end of session pt wanted to continue to focus on activity tolerance with functional ambulation in the hallway with close supervision and min cuing for RW safety over thresholds OT Plan: Discharge plan remains appropriate;Frequency remains appropriate OT Frequency: Min 2X/week Follow Up Recommendations: Skilled nursing facility Equipment Recommended: Defer to next venue OT Goals Acute Rehab OT Goals OT Goal Formulation: With patient Time For Goal Achievement: 2 days ADL Goals ADL Goal: Grooming - Progress: Progressing toward goals ADL Goal: Lower Body Bathing - Progress: Progressing toward goals ADL Goal: Upper Body Dressing - Progress: Progressing toward goals ADL Goal: Lower Body Dressing - Progress: Progressing toward goals ADL Goal: Toilet Transfer - Progress: Progressing toward goals ADL Goal: Toileting - Clothing Manipulation - Progress: Progressing toward goals ADL Goal: Toileting - Hygiene - Progress: Progressing toward goals Miscellaneous OT Goals OT Goal: Miscellaneous Goal #1 - Progress: Progressing toward goals  OT Treatment Precautions/Restrictions  Precautions Precautions: Fall Required Braces or Orthoses: No Restrictions Weight Bearing Restrictions: No   ADL ADL Grooming: Performed;Wash/dry hands;Teeth care;Supervision/safety Grooming Details (indicate cue type and reason): close to distant Supervision Where Assessed - Grooming: Standing at sink Upper Body Bathing: Chest;Right arm;Left arm;Abdomen;Set up;Performed Where Assessed - Upper Body Bathing: Sitting at sink;Unsupported (sat on 3:1) Lower Body  Bathing: Performed;Supervision/safety;Set up Where Assessed - Lower Body Bathing: Unsupported;Sit to stand from chair (3:1) Upper Body Dressing: Set up;Performed Where Assessed - Upper Body Dressing: Unsupported;Sitting, chair Lower Body Dressing: Set up;Performed;Minimal assistance Lower Body Dressing Details (indicate cue type and reason): steady A for sit to stand, and A to fasten shoes Where Assessed - Lower Body Dressing: Sit to stand from chair Toilet Transfer: Supervision/safety;Performed Toilet Transfer Method: Proofreader: Set designer - Clothing Manipulation: Supervision/safety;Set up Toileting - Hygiene: Performed;Supervision/safety;Set up Where Assessed - Toileting Hygiene: Sit on 3-in-1 or toilet ADL Comments: pt wtih increased ability to use UE to complete bathing and dressing, increased ability to perform finger opposition in both hands, didnt need or want AE, just extra time, education on TEDS and creases in TEDS Mobility  Bed Mobility Bed Mobility: No Transfers Transfers: Yes Sit to Stand: 4: Min assist;With armrests;From chair/3-in-1 Sit to Stand Details (indicate cue type and reason): verbal cues for hand placement Stand to Sit: 5: Supervision;Without upper extremity assist;With armrests Stand to Sit Details: Assistanc to slowly descend to recliner. Cues for hand placement.  Pt tends to keep hands on RW with transfer.  VCs to control transfers. Exercises Total Joint Exercises Ankle Circles/Pumps: AROM;Both;10 reps;Seated Heel Slides: AROM;Both;10 reps;Seated Long Arc Quad: Strengthening;10 reps;Seated;Both General Exercises - Lower Extremity Ankle Circles/Pumps: 20 reps;Both;Strengthening;AROM;Seated Hip Flexion/Marching: Seated;Both;Strengthening;AROM  End of Session OT - End of Session Equipment Utilized During Treatment:  (RW) Activity Tolerance: Patient tolerated treatment well Patient left: in chair;with call bell in  reach General Behavior During Session: [facility] for tasks performed Cognition: [facility] for tasks performed  Melonie Florida  11/22/2011, 2:55 PM

## 2011-11-22 NOTE — Discharge Summary (Addendum)
Patient ID: [name] MRN: [id number] DOB/AGE: 12/23/1950 60 y.o. Primary Care Physician:ROSS,CHARLES Hessie Diener, MD, MD Admit date: 11/14/2011 Discharge date: 11/22/2011    Discharge Diagnoses:  Ataxia and confusion attributed to Dilantin toxicity - resolved  Dysarthria due to Dilantin toxicity-resolved  Hypothermia due to Dilantin toxicity-resolved  Hypothyroidism  HTN (hypertension)  Seizure disorder on multiple antiepileptics  Acute on chronic diastolic heart failure Chronic lower extremity edema dating back to 2002 Constipation  Medication List  As of 11/22/2011 10:06 AM   START taking these medications         furosemide 40 MG tablet   Commonly known as: LASIX   Take 1 tablet (40 mg total) by mouth daily.      polyethylene glycol packet   Commonly known as: MIRALAX / GLYCOLAX   Take 17 g by mouth 2 (two) times daily.         CONTINUE taking these medications         aspirin EC 81 MG tablet      atorvastatin 40 MG tablet   Commonly known as: LIPITOR      benazepril 20 MG tablet   Commonly known as: LOTENSIN      levETIRAcetam 500 MG tablet   Commonly known as: KEPPRA      levothyroxine 100 MCG tablet   Commonly known as: SYNTHROID, LEVOTHROID      metoprolol tartrate 25 MG tablet   Commonly known as: LOPRESSOR      mulitivitamin with minerals Tabs      * PHENobarbital 32.4 MG tablet   Commonly known as: LUMINAL      * PHENobarbital 64.8 MG tablet   Commonly known as: LUMINAL      phenytoin 100 MG ER capsule   Commonly known as: DILANTIN     * Notice: This list has 2 medication(s) that are the same as other medications prescribed for you. Read the directions carefully, and ask your doctor or other care provider to review them with you.       STOP taking these medications         etodolac 500 MG tablet          Where to get your medications       Information on where to get these meds is not yet available. Ask your nurse or doctor.      furosemide 40 MG tablet   polyethylene glycol packet            Discharged Condition: Good    Consults: Dr. Lesia Sago from neurology  Significant Diagnostic Studies: Dg Shoulder Right  11/10/2011  *RADIOLOGY REPORT*  Clinical Data: Larey Seat 2 weeks ago, decreased range of motion  RIGHT SHOULDER - 2+ VIEW  Comparison: None.  Findings: The right humeral head is in normal position.  The right [facility] joint is normally aligned.  No acute bony abnormality is seen.  IMPRESSION: No acute abnormality.  Original Report Authenticated By: Juline Patch, M.D.   Ct Head Wo Contrast  11/14/2011  *RADIOLOGY REPORT*  Clinical Data: Slurred speech.  CT HEAD WITHOUT CONTRAST  Technique:  Contiguous axial images were obtained from the base of the skull through the vertex without contrast.  Comparison: None.  Findings: No acute intracranial abnormality.  Specifically, no hemorrhage, hydrocephalus, mass lesion, acute infarction, or significant intracranial injury.  No acute calvarial abnormality. Visualized paranasal sinuses and mastoids clear.  Orbital soft tissues unremarkable.  IMPRESSION: No acute intracranial abnormality.  Critical Value/emergent results were  called by telephone at the time of interpretation on 11/14/2011  at 12:28 p.m.  to  Dr. Weldon Inches, who verbally acknowledged these results.  Original Report Authenticated By: Cyndie Chime, M.D.   Mr Cervical Spine Wo Contrast  11/17/2011  *RADIOLOGY REPORT*  Clinical Data: Cervical myelopathy.  Leg weakness and right shoulder pain.  MRI CERVICAL SPINE WITHOUT CONTRAST  Technique:  Multiplanar and multiecho pulse sequences of the cervical spine, to include the craniocervical junction and cervicothoracic junction, were obtained according to standard protocol without intravenous contrast.  Comparison: Prior study 03/23/2011.  Findings: There are postoperative changes with decompressive laminectomies extending from C3-C5.  No complicating features.  The  cervical spinal cord demonstrates a small area of increased T2 signal intensity at C5-6 which I do not see for certain on the prior MRI.  It may be a small focus of cord edema or myelomalacia.  C2-3:  No significant findings.  C3-4:  Stable degenerative anterior subluxation of C4.  Posterior decompressive laminectomy without spinal stenosis.  There is mild foraminal stenosis bilaterally due to uncinate spurring and facet disease.  C4-5:  Decompressive laminectomy.  No spinal stenosis.  Mild foraminal stenosis bilaterally.  C5-6:  Focal central disc protrusion, facet disease and short pedicles contribute to moderate spinal stenosis with obliteration of the ventral CSF space and bilateral foraminal stenosis.  C6-7:  Diffuse bulging degenerated annulus, osteophytic ridging and uncinate spurring contributing to mild foraminal stenosis bilaterally.  No significant spinal stenosis.  C7-T1:  Broad-based left paracentral disc protrusion with mass effect on the left side of the thecal sac.  Mild left foraminal stenosis also.  IMPRESSION:  1.  Advanced degenerative cervical spondylosis for age. 2.  Postoperative changes from C3-C5 with decompressive laminectomy. 3.  Focal central disc protrusion at C5-6 along with short pedicles and facet disease contributing to moderate spinal stenosis and bilateral foraminal stenosis. There is also a focus of cord edema or myelomalacia. 4.  Broad-based left paracentral disc protrusion at C7-T1, not significantly changed.  Original Report Authenticated By: P. Loralie Champagne, M.D.    Lab Results: Results for orders placed during the hospital encounter of 11/14/11 (from the past 48 hour(s))  BASIC METABOLIC PANEL     Status: Abnormal   Collection Time   11/21/11  6:16 AM      Component Value Range Comment   Sodium 135  135 - 145 (mEq/L)    Potassium 4.0  3.5 - 5.1 (mEq/L)    Chloride 100  96 - 112 (mEq/L)    CO2 28  19 - 32 (mEq/L)    Glucose, Bld 95  70 - 99 (mg/dL)    BUN 17  6  - 23 (mg/dL)    Creatinine, Ser 0.98  0.50 - 1.35 (mg/dL)    Calcium 9.5  8.4 - 10.5 (mg/dL)    GFR calc non Af Amer 89 (*) >90 (mL/min)    GFR calc Af Amer >90  >90 (mL/min)   BASIC METABOLIC PANEL     Status: Abnormal   Collection Time   11/22/11  5:00 AM      Component Value Range Comment   Sodium 137  135 - 145 (mEq/L)    Potassium 4.2  3.5 - 5.1 (mEq/L)    Chloride 96  96 - 112 (mEq/L)    CO2 28  19 - 32 (mEq/L)    Glucose, Bld 87  70 - 99 (mg/dL)    BUN 20  6 - 23 (mg/dL)  Creatinine, Ser 1.04  0.50 - 1.35 (mg/dL)    Calcium 9.5  8.4 - 10.5 (mg/dL)    GFR calc non Af Amer 77 (*) >90 (mL/min)    GFR calc Af Amer 90 (*) >90 (mL/min)    Recent Results (from the past 240 hour(s))  MRSA PCR SCREENING     Status: Normal   Collection Time   11/14/11  6:23 PM      Component Value Range Status Comment   MRSA by PCR NEGATIVE  NEGATIVE  Final   HERPES SIMPLEX VIRUS CULTURE     Status: Normal   Collection Time   11/15/11  8:30 AM      Component Value Range Status Comment   Specimen Description OTHER   Final    Special Requests PENILE   Final    Culture No Herpes Simplex Virus detected.   Final    Report Status 11/18/2011 FINAL   Final    2 D echo  Study Conclusions  - Left ventricle: The cavity size was normal. Wall thickness was increased in a pattern of mild LVH. Systolic function was normal. The estimated ejection fraction was in the range of 55% to 60%. Wall motion was normal; there were no regional wall motion abnormalities. Doppler parameters are consistent with abnormal left ventricular relaxation (grade 1 diastolic dysfunction). - Aorta: Mildly dilated aortic root. Aortic root dimension:53mm (ED). - Mitral valve: No significant regurgitation. - Left atrium: The atrium was mildly dilated. - Right ventricle: The cavity size was normal. Systolic function was normal. - Right atrium: The atrium was mildly dilated. - Pulmonary arteries: No complete TR doppler jet so  unable to estimate PA systolic pressure. - Inferior vena cava: The vessel was normal in size; the respirophasic diameter changes were in the normal range (= 50%); findings are consistent with normal central venous pressure.    Hospital Course:  Mr. Melhorn a 60 year old gentleman with a history of seizures, was admitted to [facility] on November 14, 2011 with confusion, dysarthria and unstable gait. There was an initial concern for possible stroke, as well as concern for possible worsening cervical myelopathy. In the end it was determined that the patient's symptoms were related to Dilantin toxicity, with the initial Dilantin level at 25.3. After phenytoin was held and it was cleared from his system, the patient's speech became intelligible, he was not confused and he was able to ambulate with a walker. During this hospitalization the patient was also treated for acute on chronic diastolic congestive heart failure with repeated doses of intravenous furosemide and Zaroxolyn. Echocardiogram showed preserved ejection fraction. While the MRI of the cervical spine shows cervical myelopathy the patient can followup with his primary neurosurgeon after rehabilitation.  Discharge Exam: Blood pressure 116/79, pulse 105, temperature 97.9 F (36.6 C), temperature source Oral, resp. rate 18, height 6\' 1"  (1.854 m), weight 131.316 kg (289 lb 8 oz), SpO2 95.00%. Alert oriented x3 Chest clear to auscultation without wheeze rhonchi crackles Heart regular rate and rhythm without murmur subscales Lower extremity +2 edema  Disposition: Skilled nursing home   Discharge Orders    Future Orders Please Complete By Expires   Diet - low sodium heart healthy      Increase activity slowly         Follow-up Information    Follow up with [name], MD in 2 weeks.      Make an appointment with Lesly Dukes, MD.   Contact information:   [phone] [facility]  [address] [place] [address] [place]  [facility] [place] [address] [phone]          Signed: Lonia Blood 11/22/2011, 10:06 AM   For the last 48 hrs we have continued to diurese the patient. We have also decreased his dose of Dilantin to 100 mg po BID as he was having nystagmus and some slurred speech. He is stable for SNf today  Theodoros Stjames

## 2011-11-22 NOTE — Progress Notes (Signed)
Physical Therapy Treatment Patient Details Name: [name] MRN: [id number] DOB: 07-May-1951 Today's Date: 11/22/2011  PT Assessment/Plan  PT - Assessment/Plan Comments on Treatment Session: Pt continues to progress well however continues to have safety awareness with ambulation. PT Plan: Discharge plan remains appropriate;Frequency remains appropriate PT Frequency: Min 3X/week Follow Up Recommendations: Skilled nursing facility;24 hour supervision/assistance Equipment Recommended: Defer to next venue PT Goals  Acute Rehab PT Goals PT Goal Formulation: With patient Time For Goal Achievement: 2 weeks Pt will go Sit to Stand: with modified independence;with cues (comment type and amount) PT Goal: Sit to Stand - Progress: Progressing toward goal Pt will Transfer Bed to Chair/Chair to Bed: with supervision PT Transfer Goal: Bed to Chair/Chair to Bed - Progress: Progressing toward goal Pt will Ambulate: 51 - 150 feet;with supervision;with least restrictive assistive device PT Goal: Ambulate - Progress: Progressing toward goal Pt will Perform Home Exercise Program: with supervision, verbal cues required/provided PT Goal: Perform Home Exercise Program - Progress: Progressing toward goal  PT Treatment Precautions/Restrictions  Precautions Precautions: Fall Required Braces or Orthoses: No Restrictions Weight Bearing Restrictions: No Mobility (including Balance) Bed Mobility Bed Mobility: No Transfers Transfers: Yes Sit to Stand: 4: Min assist;From elevated surface;With upper extremity assist;From chair/3-in-1 Sit to Stand Details (indicate cue type and reason): (A) to initiate transfer. Pt with max cues for LE placement.  Pt with limited knee flexion due to swelling.  Stand to Sit: With armrests;4: Min assist;To chair/3-in-1 Stand to Sit Details: Assistanc to slowly descend to recliner. Cues for hand placement.  Pt tends to keep hands on RW with transfer.  VCs to control  transfers. Ambulation/Gait Ambulation/Gait: Yes Ambulation/Gait Assistance: 4: Min assist (minguard for safety) Ambulation/Gait Assistance Details (indicate cue type and reason): minguard for safety.  VCs for body position within RW and left LE continues to hit RW with ambulation. Ambulation Distance (Feet): 300 Feet Assistive device: Rolling walker Gait Pattern: Step-through pattern;Decreased stride length;Lateral trunk lean to right;Ataxic Stairs: No Wheelchair Mobility Wheelchair Mobility: No  Posture/Postural Control Posture/Postural Control: Postural limitations Postural Limitations: Pt. is tall which makes mobility and postural stability more difficult especially given his edema is bil LEs Balance Balance Assessed: No Static Standing Balance Static Standing - Balance Support: Bilateral upper extremity supported;During functional activity Exercise  Total Joint Exercises Ankle Circles/Pumps: AROM;Both;10 reps;Seated Heel Slides: AROM;Both;10 reps;Seated Long Arc Quad: Strengthening;10 reps;Seated;Both General Exercises - Lower Extremity Ankle Circles/Pumps: 20 reps;Both;Strengthening;AROM;Seated Hip Flexion/Marching: Seated;Both;Strengthening;AROM End of Session PT - End of Session Equipment Utilized During Treatment: Gait belt Activity Tolerance: Patient tolerated treatment well Patient left: in chair;with call bell in reach Nurse Communication: Mobility status for ambulation General Behavior During Session: [facility] for tasks performed Cognition: [facility] for tasks performed  [name] 11/22/2011, 1:20 PM [phone]

## 2011-11-23 LAB — BASIC METABOLIC PANEL
CO2: 33 mEq/L — ABNORMAL HIGH (ref 19–32)
Chloride: 96 mEq/L (ref 96–112)
Creatinine, Ser: 1.06 mg/dL (ref 0.50–1.35)

## 2011-11-23 MED ORDER — FUROSEMIDE 40 MG PO TABS
40.0000 mg | ORAL_TABLET | Freq: Every day | ORAL | Status: DC
  Administered 2011-11-24: 40 mg via ORAL
  Filled 2011-11-23: qty 1

## 2011-11-23 NOTE — Progress Notes (Signed)
Subjective: Slipped and fell this am in a pool of urine on the floor. He said he missed the urinal when he tried to urinate earlier on. Denies any pain. Did not hit his head   Objective: Filed Vitals:   11/22/11 1451 11/22/11 2200 11/23/11 0550 11/23/11 1330  BP: 119/67 128/72 120/62 112/75  Pulse: 76 80 59 77  Temp: 97.7 F (36.5 C) 97.6 F (36.4 C) 97.8 F (36.6 C) 97.6 F (36.4 C)  TempSrc: Oral Oral Oral Oral  Resp: 18 19 20 20   Height:      Weight:   131.5 kg (289 lb 14.5 oz)   SpO2: 94% 94% 93% 96%   Weight change: 0.184 kg (6.5 oz)  Intake/Output Summary (Last 24 hours) at 11/23/11 1509 Last data filed at 11/23/11 1200  Gross per 24 hour  Intake    720 ml  Output   4800 ml  Net  -4080 ml    axox3 Cvs: RRR Rs: ctab  Abdomen:soft, NT LE +2 edema Has bilateral nystagmus   2d echo  Study Conclusions  - Left ventricle: The cavity size was normal. Wall thickness was increased in a pattern of mild LVH. Systolic function was normal. The estimated ejection fraction was in the range of 55% to 60%. Wall motion was normal; there were no regional wall motion abnormalities. Doppler parameters are consistent with abnormal left ventricular relaxation (grade 1 diastolic dysfunction). - Aorta: Mildly dilated aortic root. Aortic root dimension:25mm (ED). - Mitral valve: No significant regurgitation. - Left atrium: The atrium was mildly dilated. - Right ventricle: The cavity size was normal. Systolic function was normal. - Right atrium: The atrium was mildly dilated. - Pulmonary arteries: No complete TR doppler jet so unable to estimate PA systolic pressure. - Inferior vena cava: The vessel was normal in size; the respirophasic diameter changes were in the normal range (= 50%); findings are consistent with normal central venous pressure. Impressions:  - Normal LV size with mild LV hypertrophy. EF 55-60%. Normal RV size and systolic function. Mild biatrial  enlargement. No significant valvular dysfunction.    Lab Results:  Basename 11/23/11 0635 11/22/11 0500  NA 137 137  K 3.5 4.2  CL 96 96  CO2 33* 28  GLUCOSE 82 87  BUN 25* 20  CREATININE 1.06 1.04  CALCIUM 9.6 9.5  MG -- --  PHOS -- --   Micro Results: Recent Results (from the past 240 hour(s))  MRSA PCR SCREENING     Status: Normal   Collection Time   11/14/11  6:23 PM      Component Value Range Status Comment   MRSA by PCR NEGATIVE  NEGATIVE  Final   HERPES SIMPLEX VIRUS CULTURE     Status: Normal   Collection Time   11/15/11  8:30 AM      Component Value Range Status Comment   Specimen Description OTHER   Final    Special Requests PENILE   Final    Culture No Herpes Simplex Virus detected.   Final    Report Status 11/18/2011 FINAL   Final    Results for TALEN, POSER (MRN [id number]) as of 11/20/2011 09:06  Ref. Range 11/14/2011 12:56 11/15/2011 05:00 11/15/2011 09:35 11/16/2011 09:10 11/20/2011 06:00  Phenytoin Lvl Latest Range: 10.0-20.0 ug/mL 25.3 (H) 24.6 (H)  25.9 (H) 12.2   MRI examination of the cervical spine 1. Advanced degenerative cervical spondylosis for age.  2. Postoperative changes from C3-C5 with decompressive  laminectomy.  3.  Focal central disc protrusion at C5-6 along with short pedicles  and facet disease contributing to moderate spinal stenosis and  bilateral foraminal stenosis. There is also a focus of cord edema  or myelomalacia.  4. Broad-based left paracentral disc protrusion at C7-T1, not  significantly changed.   Medications:      . aspirin EC  81 mg Oral Daily  . docusate sodium  100 mg Oral BID  . furosemide  40 mg Oral Daily  . heparin  5,000 Units Subcutaneous Q8H  . levETIRAcetam  500 mg Oral BID  . levothyroxine  300 mcg Oral QAC breakfast  . metoprolol tartrate  25 mg Oral BID  . mulitivitamin with minerals  1 tablet Oral Daily  . mupirocin cream   Topical BID  . PHENObarbital  162 mg Oral q1800  . polyethylene  glycol  17 g Oral BID  . rosuvastatin  20 mg Oral q1800  . DISCONTD: furosemide  40 mg Intravenous BID WC  . DISCONTD: metolazone  5 mg Oral Daily  . DISCONTD: phenytoin  100 mg Oral Daily  . DISCONTD: phenytoin  200 mg Oral BID  . DISCONTD: phenytoin  200 mg Oral Daily  . DISCONTD: phenytoin  300 mg Oral Daily      Dilantin toxicity  - resolved   Continue with support care.  LFT normal. Restart dilantin when level is at therapeutic range per dr Anne Hahn recommendation. See Dr Anne Hahn note 12-24.  Resumed dilantin at 200 mg BID on 11/20/11 - stopped again on 11/23/2011 after he fell  Dehydration  Resolved. Patient eating and drinking. NSL.  Dysarthria (11/14/2011) Likely secondary to dilantin toxicity. resolved. Appreciate neuro help. CT head negative.  Tylenol and salicylates level negative.   Hypothermia (11/14/2011). Resolved.   Chronic Lower extremity Edema- suspect due to diastolic chf   c/w lasix but change to po   Hypothyroidism (11/14/2011) Continue with synthroid. TSH low, but T3 and T4 normal range. Continue with same dose of synthroid.   HTN (hypertension) (11/14/2011) Continue with metoprolol,will add lasix due to severe edema  Cervical Myelopathy: - stable  MRI cervical spine with chronic changes -  Left lower extremity redness: mild redness. c/w local mupirocin.  Redness decreased.   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 HIV non reactive, RPR non reactive.   Seizure disorder (11/14/2011)  Continue with Keppra, and phenobabital. dilantin per pharmacy    Still awaiting SNF placement    LOS: 9 days   Shandy Vi M.D.  [facility] 11/23/2011, 3:09 PM

## 2011-11-24 LAB — BASIC METABOLIC PANEL
CO2: 32 mEq/L (ref 19–32)
Calcium: 9.4 mg/dL (ref 8.4–10.5)
Glucose, Bld: 82 mg/dL (ref 70–99)
Sodium: 135 mEq/L (ref 135–145)

## 2011-11-24 MED ORDER — PHENYTOIN SODIUM EXTENDED 100 MG PO CAPS
100.0000 mg | ORAL_CAPSULE | Freq: Two times a day (BID) | ORAL | Status: DC
  Administered 2011-11-24: 100 mg via ORAL
  Filled 2011-11-24 (×2): qty 1

## 2011-11-24 MED ORDER — POTASSIUM CHLORIDE ER 10 MEQ PO TBCR: 10.0000 meq | EXTENDED_RELEASE_TABLET | Freq: Every day | ORAL | Status: DC

## 2011-11-24 MED ORDER — POTASSIUM CHLORIDE CRYS ER 20 MEQ PO TBCR
40.0000 meq | EXTENDED_RELEASE_TABLET | Freq: Once | ORAL | Status: AC
  Administered 2011-11-24: 40 meq via ORAL
  Filled 2011-11-24: qty 2

## 2011-11-24 MED ORDER — PHENYTOIN SODIUM EXTENDED 100 MG PO CAPS: 100.0000 mg | ORAL_CAPSULE | Freq: Two times a day (BID) | ORAL | Status: DC

## 2011-11-24 NOTE — Progress Notes (Signed)
Subjective:  Feels great   Objective: Filed Vitals:   11/23/11 1330 11/23/11 2206 11/23/11 2234 11/24/11 0703  BP: 112/75 133/75 158/95 109/64  Pulse: 77 59 67 64  Temp: 97.6 F (36.4 C)  97.5 F (36.4 C) 98.7 F (37.1 C)  TempSrc: Oral  Oral Oral  Resp: 20  20 19   Height:      Weight:    131.7 kg (290 lb 5.5 oz)  SpO2: 96%  96% 91%   Weight change:   Intake/Output Summary (Last 24 hours) at 11/24/11 0912 Last data filed at 11/24/11 0756  Gross per 24 hour  Intake   1140 ml  Output   5800 ml  Net  -4660 ml   axox3 Cvs: rrr Rs: ctab  Le +1 edema   2d echo  Study Conclusions  - Left ventricle: The cavity size was normal. Wall thickness was increased in a pattern of mild LVH. Systolic function was normal. The estimated ejection fraction was in the range of 55% to 60%. Wall motion was normal; there were no regional wall motion abnormalities. Doppler parameters are consistent with abnormal left ventricular relaxation (grade 1 diastolic dysfunction). - Aorta: Mildly dilated aortic root. Aortic root dimension:33mm (ED). - Mitral valve: No significant regurgitation. - Left atrium: The atrium was mildly dilated. - Right ventricle: The cavity size was normal. Systolic function was normal. - Right atrium: The atrium was mildly dilated. - Pulmonary arteries: No complete TR doppler jet so unable to estimate PA systolic pressure. - Inferior vena cava: The vessel was normal in size; the respirophasic diameter changes were in the normal range (= 50%); findings are consistent with normal central venous pressure. Impressions:  - Normal LV size with mild LV hypertrophy. EF 55-60%. Normal RV size and systolic function. Mild biatrial enlargement. No significant valvular dysfunction.    Lab Results:  Basename 11/24/11 0550 11/23/11 0635  NA 135 137  K 3.1* 3.5  CL 92* 96  CO2 32 33*  GLUCOSE 82 82  BUN 28* 25*  CREATININE 1.02 1.06  CALCIUM 9.4 9.6  MG -- --    PHOS -- --   Micro Results: Recent Results (from the past 240 hour(s))  MRSA PCR SCREENING     Status: Normal   Collection Time   11/14/11  6:23 PM      Component Value Range Status Comment   MRSA by PCR NEGATIVE  NEGATIVE  Final   HERPES SIMPLEX VIRUS CULTURE     Status: Normal   Collection Time   11/15/11  8:30 AM      Component Value Range Status Comment   Specimen Description OTHER   Final    Special Requests PENILE   Final    Culture No Herpes Simplex Virus detected.   Final    Report Status 11/18/2011 FINAL   Final    Results for JAIZON, DEROOS (MRN [id number]) as of 11/20/2011 09:06  Ref. Range 11/14/2011 12:56 11/15/2011 05:00 11/15/2011 09:35 11/16/2011 09:10 11/20/2011 06:00  Phenytoin Lvl Latest Range: 10.0-20.0 ug/mL 25.3 (H) 24.6 (H)  25.9 (H) 12.2  11/23/11 level was 5  MRI examination of the cervical spine 1. Advanced degenerative cervical spondylosis for age.  2. Postoperative changes from C3-C5 with decompressive  laminectomy.  3. Focal central disc protrusion at C5-6 along with short pedicles  and facet disease contributing to moderate spinal stenosis and  bilateral foraminal stenosis. There is also a focus of cord edema  or myelomalacia.  4. Broad-based left  paracentral disc protrusion at C7-T1, not  significantly changed.   Medications:      . aspirin EC  81 mg Oral Daily  . docusate sodium  100 mg Oral BID  . furosemide  40 mg Oral Daily  . heparin  5,000 Units Subcutaneous Q8H  . levETIRAcetam  500 mg Oral BID  . levothyroxine  300 mcg Oral QAC breakfast  . metoprolol tartrate  25 mg Oral BID  . mulitivitamin with minerals  1 tablet Oral Daily  . mupirocin cream   Topical BID  . PHENObarbital  162 mg Oral q1800  . phenytoin  100 mg Oral BID  . polyethylene glycol  17 g Oral BID  . rosuvastatin  20 mg Oral q1800  . DISCONTD: furosemide  40 mg Intravenous BID WC  . DISCONTD: metolazone  5 mg Oral Daily  . DISCONTD: phenytoin  100 mg Oral  Daily  . DISCONTD: phenytoin  200 mg Oral BID  . DISCONTD: phenytoin  200 mg Oral Daily  . DISCONTD: phenytoin  300 mg Oral Daily      Dilantin toxicity  - resolved   Continue with support care.  LFT normal. Restart dilantin when level is at therapeutic range per dr Anne Hahn recommendation. See Dr Anne Hahn note 12-24.  Resumed dilantin at 200 mg BID on 11/20/11 - stopped again on 11/23/2011 after he fell - resumed at 100 BID on 11/24/11  Dehydration  Resolved. Patient eating and drinking. NSL.  Dysarthria (11/14/2011) Likely secondary to dilantin toxicity. resolved. Appreciate neuro help. CT head negative.  Tylenol and salicylates level negative.   Hypothermia (11/14/2011). Resolved.   Chronic Lower extremity Edema- suspect due to diastolic chf   c/w lasix but change to po   Hypothyroidism (11/14/2011) Continue with synthroid. TSH low, but T3 and T4 normal range. Continue with same dose of synthroid.   HTN (hypertension) (11/14/2011) Continue with metoprolol,will add lasix due to severe edema  Cervical Myelopathy: - stable  MRI cervical spine with chronic changes -  Left lower extremity redness: mild redness. c/w local mupirocin.  Redness decreased.   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 HIV non reactive, RPR non reactive.   Seizure disorder (11/14/2011)  Continue with Keppra, and phenobabital and  Dilantin     SNF placement    LOS: 10 days   Randel Hargens M.D.  [facility] 11/24/2011, 9:12 AM

## 2011-11-24 NOTE — Progress Notes (Signed)
Patient for d/c today to [facility]. Plan transfer via EMS. Reece Levy, MSW, Theresia Majors [phone]

## 2011-11-24 NOTE — Progress Notes (Signed)
MEDICATION RELATED CONSULT NOTE - FOLLOW UP   Pharmacy Consult for Phenytoin Indication: Seizure disorder  Allergies  Allergen Reactions  . Latex Rash    Hands rash and  mild hives    Patient Measurements: Height: 6\' 1"  (185.4 cm) Weight: 290 lb 5.5 oz (131.7 kg) IBW/kg (Calculated) : 79.9  Adjusted Body Weight: 95.4kg  Vital Signs: Temp: 98.7 F (37.1 C) (01/02 0703) Temp src: Oral (01/02 0703) BP: 109/64 mmHg (01/02 0703) Pulse Rate: 64  (01/02 0703) Intake/Output from previous day: 01/01 0701 - 01/02 0700 In: 1380 [P.O.:1380] Out: 5000 [Urine:5000] Intake/Output from this shift: Total I/O In: -  Out: 800 [Urine:800]  Labs:  Basename 11/24/11 0550 11/23/11 0635 11/22/11 0500  WBC -- -- --  HGB -- -- --  HCT -- -- --  PLT -- -- --  APTT -- -- --  CREATININE 1.02 1.06 1.04  LABCREA -- -- --  CREATININE 1.02 1.06 1.04  CREAT24HRUR -- -- --  MG -- -- --  PHOS -- -- --  ALBUMIN -- -- --  PROT -- -- --  ALBUMIN -- -- --  AST -- -- --  ALT -- -- --  ALKPHOS -- -- --  BILITOT -- -- --  BILIDIR -- -- --  IBILI -- -- --   Estimated Creatinine Clearance: 112.3 ml/min (by C-G formula based on Cr of 1.02).   Phenytoin level = 5 mcg/ml Albumin 3.3 g/dL   Microbiology: Recent Results (from the past 720 hour(s))  MRSA PCR SCREENING     Status: Normal   Collection Time   11/14/11  6:23 PM      Component Value Range Status Comment   MRSA by PCR NEGATIVE  NEGATIVE  Final   HERPES SIMPLEX VIRUS CULTURE     Status: Normal   Collection Time   11/15/11  8:30 AM      Component Value Range Status Comment   Specimen Description OTHER   Final    Special Requests PENILE   Final    Culture No Herpes Simplex Virus detected.   Final    Report Status 11/18/2011 FINAL   Final     Medications:  Scheduled:    . aspirin EC  81 mg Oral Daily  . docusate sodium  100 mg Oral BID  . furosemide  40 mg Oral Daily  . heparin  5,000 Units Subcutaneous Q8H  . levETIRAcetam   500 mg Oral BID  . levothyroxine  300 mcg Oral QAC breakfast  . metoprolol tartrate  25 mg Oral BID  . mulitivitamin with minerals  1 tablet Oral Daily  . mupirocin cream   Topical BID  . PHENObarbital  162 mg Oral q1800  . polyethylene glycol  17 g Oral BID  . rosuvastatin  20 mg Oral q1800  . DISCONTD: furosemide  40 mg Intravenous BID WC  . DISCONTD: metolazone  5 mg Oral Daily  . DISCONTD: phenytoin  100 mg Oral Daily  . DISCONTD: phenytoin  200 mg Oral BID  . DISCONTD: phenytoin  200 mg Oral Daily  . DISCONTD: phenytoin  300 mg Oral Daily   Assessment: Seizure disorder: Currently being transitioned off of Phenytoin to Keppra + Phenobarbital for seizure control.  He was admitted with Phenytoin toxicity that has resolved.  His current Phenytoin level corrects to 6.6 mcg/ml, which is below the desired therapeutic range of 10-20 mcg/ml.  Note his Phenytoin is currently on hold due to a fall that occurred 11/23/11.  Goal of Therapy:  Phenytoin level 10-20 mcg/ml, absence of signs and symptoms of toxicity  Plan:  Will await Dr. Ellin Saba input regarding Phenytoin restart.  As his level is currently subtherapeutic he likely can restart his taper at the dose he was previously receiving (200mg  BID x 2 weeks) if his signs and symptoms of phenytoin toxicity have resolved.  Estella Husk, Pharm.D., BCPS Clinical Pharmacist  Pager [phone] 11/24/2011, 9:12 AM

## 2011-11-24 NOTE — Progress Notes (Signed)
Pt provided with d/c information. Pt verbalized understanding. VSS with no questions at this time. EMS given social work Advertising copywriter for [employer]. Pt left unit with [facility] for [facility]. Ramond Craver, RN

## 2011-11-25 ENCOUNTER — Other Ambulatory Visit (HOSPITAL_COMMUNITY): Payer: 59

## 2011-11-25 NOTE — Progress Notes (Signed)
   CARE MANAGEMENT NOTE 11/25/2011  Patient:  [name], [name]   Account Number:  [id number]  Date Initiated:  11/15/2011  Documentation initiated by:  Donn Pierini  Subjective/Objective Assessment:   Pt admitted with Dilantin toxicity     Action/Plan:   PTA pt lived at home was independent with ADLs   Anticipated DC Date:  11/17/2011   Anticipated DC Plan:  HOME/SELF CARE  In-house referral  Clinical Social Worker      DC Planning Services  CM consult      Choice offered to / List presented to:             Status of service:  Completed, signed off Medicare Important Message given?   (If response is "NO", the following Medicare IM given date fields will be blank) Date Medicare IM given:   Date Additional Medicare IM given:    Discharge Disposition:  SKILLED NURSING FACILITY  Per UR Regulation:  Reviewed for med. necessity/level of care/duration of stay  Comments:  PCP- Tenny Craw  11/24/11- Donn Pierini RN, BSN [phone] Pt for discharge to SNF today, CSW following for placement nees  11/17/11- 1400- Donn Pierini RN, BSN [phone] Per PT/OT notes recommending SNF for rehab, CSW consulted for placement needs.

## 2012-01-31 ENCOUNTER — Other Ambulatory Visit: Payer: Self-pay | Admitting: Neurosurgery

## 2012-01-31 DIAGNOSIS — M4802 Spinal stenosis, cervical region: Secondary | ICD-10-CM

## 2012-01-31 DIAGNOSIS — M5000 Cervical disc disorder with myelopathy, unspecified cervical region: Secondary | ICD-10-CM

## 2012-01-31 DIAGNOSIS — M4712 Other spondylosis with myelopathy, cervical region: Secondary | ICD-10-CM

## 2012-02-01 ENCOUNTER — Ambulatory Visit
Admission: RE | Admit: 2012-02-01 | Discharge: 2012-02-01 | Disposition: A | Discharge status: Home / Self Care | Payer: Medicare Other | Source: Ambulatory Visit | Attending: Neurosurgery | Admitting: Neurosurgery

## 2012-02-01 DIAGNOSIS — M5000 Cervical disc disorder with myelopathy, unspecified cervical region: Secondary | ICD-10-CM

## 2012-02-01 DIAGNOSIS — M4802 Spinal stenosis, cervical region: Secondary | ICD-10-CM

## 2012-02-01 DIAGNOSIS — M4712 Other spondylosis with myelopathy, cervical region: Secondary | ICD-10-CM

## 2012-02-07 ENCOUNTER — Other Ambulatory Visit: Payer: Self-pay | Admitting: Neurosurgery

## 2012-02-11 ENCOUNTER — Encounter (HOSPITAL_COMMUNITY): Payer: Self-pay | Admitting: Pharmacy Technician

## 2012-02-14 ENCOUNTER — Encounter (HOSPITAL_COMMUNITY): Payer: Self-pay

## 2012-02-14 ENCOUNTER — Encounter (HOSPITAL_COMMUNITY)
Admission: RE | Admit: 2012-02-14 | Discharge: 2012-02-14 | Disposition: A | Discharge status: Home / Self Care | Payer: Medicare Other | Source: Ambulatory Visit | Attending: Neurosurgery | Admitting: Neurosurgery

## 2012-02-14 HISTORY — DX: Rash and other nonspecific skin eruption: R21

## 2012-02-14 HISTORY — DX: Calculus of kidney: N20.0

## 2012-02-14 HISTORY — DX: Malignant (primary) neoplasm, unspecified: C80.1

## 2012-02-14 HISTORY — DX: Unspecified osteoarthritis, unspecified site: M19.90

## 2012-02-14 LAB — BASIC METABOLIC PANEL
CO2: 25 mEq/L (ref 19–32)
Calcium: 9.9 mg/dL (ref 8.4–10.5)
Chloride: 103 mEq/L (ref 96–112)
GFR calc Af Amer: >90 mL/min (ref >90)
Sodium: 138 mEq/L (ref 135–145)

## 2012-02-14 LAB — SURGICAL PCR SCREEN
MRSA, PCR: NEGATIVE
Staphylococcus aureus: NEGATIVE

## 2012-02-14 LAB — CBC
HCT: 40.5 % (ref 39.0–52.0)
MCV: 93.3 fL (ref 78.0–100.0)
Platelets: 179 10*3/uL (ref 150–400)
RBC: 4.34 MIL/uL (ref 4.22–5.81)
WBC: 6.9 10*3/uL (ref 4.0–10.5)

## 2012-02-14 NOTE — Pre-Procedure Instructions (Signed)
22 [name]  02/14/2012   Your procedure is scheduled on:   Monday  02/21/12  Report to [facility] at 530 AM.  Call this number if you have problems the morning of surgery: [phone]   Remember:   Do not eat food:After Midnight.  May have clear liquids: up to 4 Hours before arrival.  Clear liquids include soda, tea, black coffee, apple or grape juice, broth.  Take these medicines the morning of surgery with A SIP OF WATER:   Keppra, synthroid, metoprolol(lopressor)    STOP ASPIRIN, COUMADIN, PLAVIX, EFFIENT, HERBAL MEDS   Do not wear jewelry, make-up or nail polish.  Do not wear lotions, powders, or perfumes. You may wear deodorant.  Do not shave 48 hours prior to surgery.  Do not bring valuables to the hospital.  Contacts, dentures or bridgework may not be worn into surgery.  Leave suitcase in the car. After surgery it may be brought to your room.  For patients admitted to the hospital, checkout time is 11:00 AM the day of discharge.   Patients discharged the day of surgery will not be allowed to drive home.  Name and phone number of your driver:   Special Instructions: CHG Shower Use Special Wash: 1/2 bottle night before surgery and 1/2 bottle morning of surgery.   Please read over the following fact sheets that you were given: Pain Booklet, MRSA Information and Surgical Site Infection Prevention

## 2012-02-14 NOTE — Progress Notes (Signed)
Forwarded to [place] for review of EKG from 11/13/12 and echo 11/19/12.

## 2012-02-16 NOTE — Consult Note (Signed)
Anesthesia:  Patient is a 61 year old male scheduled for a C5-6 ACDF on 02/21/12.  Other history includes HTN, non-smoker, hypothyroidism, OSA, kidney stones, seizure disorder, hypercholesterolemia, cataracts, asthma, skin cancer, prior neck surgery (C3-5 posterior cervical arthrodesis in May 2012).  His PCP is Dr. Vianne Bulls.  He was admitted to [facility] in late December 2012 with ataxia, confusion, hypothermia related to Dilantin toxicity and was also treated for acute on chronic diastolic heart failure.   His neurological symptoms resolved with stopping his Dilantin (he is now on Phenobarbital and Keppra), and his CHF improved with diuresis.  He also had an echocardiogram on 11/20/11 that showed:   - Left ventricle: The cavity size was normal. Wall thickness was increased in a pattern of mild LVH. Systolic function was normal. The estimated ejection fraction was in the range of 55% to 60%. Wall motion was normal; there were no regional wall motion abnormalities. Doppler parameters are consistent with abnormal left ventricular relaxation (grade 1 diastolic dysfunction). - Aorta: Mildly dilated aortic root. Aortic root dimension:69mm (ED). - Mitral valve: No significant regurgitation. - Left atrium: The atrium was mildly dilated. - Right ventricle: The cavity size was normal. Systolic function was normal. - Right atrium: The atrium was mildly dilated. - Pulmonary arteries: No complete TR doppler jet so unable to estimate PA systolic pressure. - Inferior vena cava: The vessel was normal in size; the respirophasic diameter changes were in the normal range (= 50%); findings are consistent with normal central venous pressure.  His EKG on initial evaluation on 11/14/11 showed questionable presence of P waves, and it was interpreted as accelerated junctional with nonspecific intraventricular conduction delay.  An EKG was not repeated after his Dilantin toxicity resolved, but I did review  multiple rhythm strips taken during the beginning and end of his hospitalization that appeared to show SR.  I think his EKG should be repeated pre-operatively, if not done so already by his PCP Dr. Tenny Craw.  Will request records as available.  Labs reviewed.  CXR from 03/30/11 showed left lower lobe scarring. No acute cardiopulmonary disease.   Anticipate he can proceed if EKG stable, no new CV/CHF symptoms, recent seizure, or significant change in his status.

## 2012-02-17 NOTE — Progress Notes (Signed)
[facility] returned call and stated they do not have an ekg completed on pt. Since 11/13/12.

## 2012-02-20 MED ORDER — CEFAZOLIN SODIUM-DEXTROSE 2-3 GM-% IV SOLR
2.0000 g | INTRAVENOUS | Status: DC
  Filled 2012-02-20: qty 50

## 2012-02-21 ENCOUNTER — Ambulatory Visit (HOSPITAL_COMMUNITY): Payer: Medicare Other | Admitting: Vascular Surgery

## 2012-02-21 ENCOUNTER — Ambulatory Visit (HOSPITAL_COMMUNITY): Payer: Medicare Other

## 2012-02-21 ENCOUNTER — Encounter (HOSPITAL_COMMUNITY)
Admission: RE | Disposition: A | Discharge status: Home / Self Care | Payer: Self-pay | Source: Ambulatory Visit | Attending: Neurosurgery

## 2012-02-21 ENCOUNTER — Inpatient Hospital Stay (HOSPITAL_COMMUNITY)
Admission: RE | Admit: 2012-02-21 | Discharge: 2012-02-22 | DRG: 471 | Disposition: A | Discharge status: Home / Self Care | Payer: Medicare Other | Source: Ambulatory Visit | Attending: Neurosurgery | Admitting: Neurosurgery

## 2012-02-21 ENCOUNTER — Encounter (HOSPITAL_COMMUNITY): Payer: Self-pay

## 2012-02-21 ENCOUNTER — Encounter (HOSPITAL_COMMUNITY): Payer: Self-pay | Admitting: Vascular Surgery

## 2012-02-21 ENCOUNTER — Other Ambulatory Visit: Payer: Self-pay

## 2012-02-21 DIAGNOSIS — Z7982 Long term (current) use of aspirin: Secondary | ICD-10-CM

## 2012-02-21 DIAGNOSIS — E7800 Pure hypercholesterolemia, unspecified: Secondary | ICD-10-CM | POA: Diagnosis present

## 2012-02-21 DIAGNOSIS — Z8249 Family history of ischemic heart disease and other diseases of the circulatory system: Secondary | ICD-10-CM

## 2012-02-21 DIAGNOSIS — E039 Hypothyroidism, unspecified: Secondary | ICD-10-CM | POA: Diagnosis present

## 2012-02-21 DIAGNOSIS — M4712 Other spondylosis with myelopathy, cervical region: Principal | ICD-10-CM | POA: Diagnosis present

## 2012-02-21 DIAGNOSIS — Z79899 Other long term (current) drug therapy: Secondary | ICD-10-CM

## 2012-02-21 DIAGNOSIS — G4733 Obstructive sleep apnea (adult) (pediatric): Secondary | ICD-10-CM | POA: Diagnosis present

## 2012-02-21 DIAGNOSIS — M5000 Cervical disc disorder with myelopathy, unspecified cervical region: Secondary | ICD-10-CM | POA: Diagnosis present

## 2012-02-21 DIAGNOSIS — G8250 Quadriplegia, unspecified: Secondary | ICD-10-CM | POA: Diagnosis present

## 2012-02-21 DIAGNOSIS — G40909 Epilepsy, unspecified, not intractable, without status epilepticus: Secondary | ICD-10-CM | POA: Diagnosis present

## 2012-02-21 DIAGNOSIS — I10 Essential (primary) hypertension: Secondary | ICD-10-CM | POA: Diagnosis present

## 2012-02-21 DIAGNOSIS — Z01812 Encounter for preprocedural laboratory examination: Secondary | ICD-10-CM

## 2012-02-21 HISTORY — PX: ANTERIOR CERVICAL DECOMP/DISCECTOMY FUSION: SHX1161

## 2012-02-21 SURGERY — ANTERIOR CERVICAL DECOMPRESSION/DISCECTOMY FUSION 1 LEVEL
Anesthesia: General | Site: Spine Cervical | Wound class: Clean

## 2012-02-21 MED ORDER — SODIUM CHLORIDE 0.9 % IV SOLN
INTRAVENOUS | Status: AC
  Filled 2012-02-21: qty 500

## 2012-02-21 MED ORDER — PHENOL 1.4 % MT LIQD: 1.0000 | OROMUCOSAL | Status: DC | PRN

## 2012-02-21 MED ORDER — PHENYLEPHRINE HCL 10 MG/ML IJ SOLN
INTRAMUSCULAR | Status: DC | PRN
  Administered 2012-02-21 (×2): 80 ug via INTRAVENOUS

## 2012-02-21 MED ORDER — MAGNESIUM HYDROXIDE 400 MG/5ML PO SUSP: 30.0000 mL | Freq: Every day | ORAL | Status: DC | PRN

## 2012-02-21 MED ORDER — BUPIVACAINE HCL (PF) 0.5 % IJ SOLN
INTRAMUSCULAR | Status: DC | PRN
  Administered 2012-02-21: 30 mL

## 2012-02-21 MED ORDER — BENAZEPRIL HCL 20 MG PO TABS
20.0000 mg | ORAL_TABLET | Freq: Every evening | ORAL | Status: DC
  Administered 2012-02-21: 20 mg via ORAL
  Filled 2012-02-21 (×2): qty 1

## 2012-02-21 MED ORDER — ROCURONIUM BROMIDE 100 MG/10ML IV SOLN
INTRAVENOUS | Status: DC | PRN
  Administered 2012-02-21 (×2): 50 mg via INTRAVENOUS

## 2012-02-21 MED ORDER — ALUM & MAG HYDROXIDE-SIMETH 200-200-20 MG/5ML PO SUSP: 30.0000 mL | Freq: Four times a day (QID) | ORAL | Status: DC | PRN

## 2012-02-21 MED ORDER — CEFAZOLIN SODIUM 1-5 GM-% IV SOLN
INTRAVENOUS | Status: AC
  Filled 2012-02-21: qty 50

## 2012-02-21 MED ORDER — PHENOBARBITAL 32.4 MG PO TABS
129.6000 mg | ORAL_TABLET | Freq: Every evening | ORAL | Status: DC
  Administered 2012-02-21: 129.6 mg via ORAL
  Filled 2012-02-21: qty 4

## 2012-02-21 MED ORDER — THROMBIN 5000 UNITS EX KIT
PACK | CUTANEOUS | Status: DC | PRN
  Administered 2012-02-21: 5000 [IU] via TOPICAL

## 2012-02-21 MED ORDER — LIDOCAINE-EPINEPHRINE 1 %-1:100000 IJ SOLN
INTRAMUSCULAR | Status: DC | PRN
  Administered 2012-02-21: 20 mL

## 2012-02-21 MED ORDER — METOPROLOL TARTRATE 25 MG PO TABS
25.0000 mg | ORAL_TABLET | Freq: Two times a day (BID) | ORAL | Status: DC
  Administered 2012-02-21 – 2012-02-22 (×2): 25 mg via ORAL
  Filled 2012-02-21 (×4): qty 1

## 2012-02-21 MED ORDER — HYDROMORPHONE HCL PF 1 MG/ML IJ SOLN: 0.2500 mg | INTRAMUSCULAR | Status: DC | PRN

## 2012-02-21 MED ORDER — ACETAMINOPHEN 325 MG PO TABS: 650.0000 mg | ORAL_TABLET | ORAL | Status: DC | PRN

## 2012-02-21 MED ORDER — OXYCODONE-ACETAMINOPHEN 5-325 MG PO TABS: 1.0000 | ORAL_TABLET | ORAL | Status: DC | PRN

## 2012-02-21 MED ORDER — SODIUM CHLORIDE 0.9 % IJ SOLN: 3.0000 mL | INTRAMUSCULAR | Status: DC | PRN

## 2012-02-21 MED ORDER — KETOROLAC TROMETHAMINE 30 MG/ML IJ SOLN
30.0000 mg | Freq: Four times a day (QID) | INTRAMUSCULAR | Status: DC
  Administered 2012-02-21 – 2012-02-22 (×4): 30 mg via INTRAVENOUS
  Filled 2012-02-21 (×7): qty 1

## 2012-02-21 MED ORDER — 0.9 % SODIUM CHLORIDE (POUR BTL) OPTIME
TOPICAL | Status: DC | PRN
  Administered 2012-02-21: 1000 mL

## 2012-02-21 MED ORDER — SODIUM CHLORIDE 0.9 % IJ SOLN
3.0000 mL | Freq: Two times a day (BID) | INTRAMUSCULAR | Status: DC
  Administered 2012-02-21 – 2012-02-22 (×3): 3 mL via INTRAVENOUS

## 2012-02-21 MED ORDER — CYCLOBENZAPRINE HCL 10 MG PO TABS: 10.0000 mg | ORAL_TABLET | Freq: Three times a day (TID) | ORAL | Status: DC | PRN

## 2012-02-21 MED ORDER — SUFENTANIL CITRATE 50 MCG/ML IV SOLN
INTRAVENOUS | Status: DC | PRN
  Administered 2012-02-21: 15 ug via INTRAVENOUS
  Administered 2012-02-21: 5 ug via INTRAVENOUS

## 2012-02-21 MED ORDER — LACTATED RINGERS IV SOLN
INTRAVENOUS | Status: DC | PRN
  Administered 2012-02-21 (×3): via INTRAVENOUS

## 2012-02-21 MED ORDER — LIDOCAINE HCL 4 % MT SOLN
OROMUCOSAL | Status: DC | PRN
  Administered 2012-02-21: 4 mL via TOPICAL

## 2012-02-21 MED ORDER — LEVOTHYROXINE SODIUM 150 MCG PO TABS
300.0000 ug | ORAL_TABLET | Freq: Every day | ORAL | Status: DC
  Administered 2012-02-22: 300 ug via ORAL
  Filled 2012-02-21 (×2): qty 2

## 2012-02-21 MED ORDER — ZOLPIDEM TARTRATE 5 MG PO TABS: 10.0000 mg | ORAL_TABLET | Freq: Every evening | ORAL | Status: DC | PRN

## 2012-02-21 MED ORDER — HEMOSTATIC AGENTS (NO CHARGE) OPTIME
TOPICAL | Status: DC | PRN
  Administered 2012-02-21: 1 via TOPICAL

## 2012-02-21 MED ORDER — LEVETIRACETAM 750 MG PO TABS
750.0000 mg | ORAL_TABLET | Freq: Two times a day (BID) | ORAL | Status: DC
  Administered 2012-02-21 – 2012-02-22 (×2): 750 mg via ORAL
  Filled 2012-02-21 (×4): qty 1

## 2012-02-21 MED ORDER — CEFAZOLIN SODIUM-DEXTROSE 2-3 GM-% IV SOLR
2.0000 g | Freq: Three times a day (TID) | INTRAVENOUS | Status: AC
  Administered 2012-02-21 (×2): 2 g via INTRAVENOUS
  Filled 2012-02-21 (×3): qty 50

## 2012-02-21 MED ORDER — SODIUM CHLORIDE 0.9 % IR SOLN
Status: DC | PRN
  Administered 2012-02-21: 09:00:00

## 2012-02-21 MED ORDER — ONDANSETRON HCL 4 MG/2ML IJ SOLN
INTRAMUSCULAR | Status: DC | PRN
  Administered 2012-02-21: 4 mg via INTRAVENOUS

## 2012-02-21 MED ORDER — ATORVASTATIN CALCIUM 40 MG PO TABS
40.0000 mg | ORAL_TABLET | Freq: Every evening | ORAL | Status: DC
  Administered 2012-02-21: 40 mg via ORAL
  Filled 2012-02-21 (×2): qty 1

## 2012-02-21 MED ORDER — MORPHINE SULFATE 4 MG/ML IJ SOLN: 4.0000 mg | INTRAMUSCULAR | Status: DC | PRN

## 2012-02-21 MED ORDER — EPHEDRINE SULFATE 50 MG/ML IJ SOLN
INTRAMUSCULAR | Status: DC | PRN
  Administered 2012-02-21 (×2): 10 mg via INTRAVENOUS
  Administered 2012-02-21: 5 mg via INTRAVENOUS

## 2012-02-21 MED ORDER — MIDAZOLAM HCL 5 MG/5ML IJ SOLN
INTRAMUSCULAR | Status: DC | PRN
  Administered 2012-02-21: 1 mg via INTRAVENOUS

## 2012-02-21 MED ORDER — HYDROXYZINE HCL 25 MG PO TABS: 50.0000 mg | ORAL_TABLET | ORAL | Status: DC | PRN

## 2012-02-21 MED ORDER — CEFAZOLIN SODIUM 1-5 GM-% IV SOLN
INTRAVENOUS | Status: DC | PRN
  Administered 2012-02-21: 2 g via INTRAVENOUS

## 2012-02-21 MED ORDER — HYDROXYZINE HCL 50 MG/ML IM SOLN: 50.0000 mg | INTRAMUSCULAR | Status: DC | PRN

## 2012-02-21 MED ORDER — PHENOBARBITAL 32.4 MG PO TABS
32.4000 mg | ORAL_TABLET | Freq: Every evening | ORAL | Status: DC
  Administered 2012-02-21: 32.4 mg via ORAL
  Filled 2012-02-21: qty 1

## 2012-02-21 MED ORDER — MENTHOL 3 MG MT LOZG
1.0000 | LOZENGE | OROMUCOSAL | Status: DC | PRN
  Administered 2012-02-21: 3 mg via ORAL
  Filled 2012-02-21: qty 9

## 2012-02-21 MED ORDER — PROPOFOL 10 MG/ML IV EMUL
INTRAVENOUS | Status: DC | PRN
  Administered 2012-02-21: 150 mg via INTRAVENOUS

## 2012-02-21 MED ORDER — LIDOCAINE HCL (CARDIAC) 20 MG/ML IV SOLN
INTRAVENOUS | Status: DC | PRN
  Administered 2012-02-21: 100 mg via INTRAVENOUS

## 2012-02-21 MED ORDER — KETOROLAC TROMETHAMINE 30 MG/ML IJ SOLN: 30.0000 mg | Freq: Once | INTRAMUSCULAR | Status: DC

## 2012-02-21 MED ORDER — NEOSTIGMINE METHYLSULFATE 1 MG/ML IJ SOLN
INTRAMUSCULAR | Status: DC | PRN
  Administered 2012-02-21: 5 mg via INTRAVENOUS

## 2012-02-21 MED ORDER — ACETAMINOPHEN 650 MG RE SUPP: 650.0000 mg | RECTAL | Status: DC | PRN

## 2012-02-21 MED ORDER — BACITRACIN 50000 UNITS IM SOLR
INTRAMUSCULAR | Status: AC
  Filled 2012-02-21: qty 1

## 2012-02-21 MED ORDER — GLYCOPYRROLATE 0.2 MG/ML IJ SOLN
INTRAMUSCULAR | Status: DC | PRN
  Administered 2012-02-21: .8 mg via INTRAVENOUS

## 2012-02-21 MED ORDER — KCL IN DEXTROSE-NACL 20-5-0.45 MEQ/L-%-% IV SOLN
INTRAVENOUS | Status: DC
  Filled 2012-02-21 (×6): qty 1000

## 2012-02-21 MED ORDER — HYDROCODONE-ACETAMINOPHEN 5-325 MG PO TABS: 1.0000 | ORAL_TABLET | ORAL | Status: DC | PRN

## 2012-02-21 MED ORDER — SUCCINYLCHOLINE CHLORIDE 20 MG/ML IJ SOLN
INTRAMUSCULAR | Status: DC | PRN
  Administered 2012-02-21: 100 mg via INTRAVENOUS

## 2012-02-21 MED ORDER — DEXAMETHASONE SODIUM PHOSPHATE 4 MG/ML IJ SOLN
INTRAMUSCULAR | Status: DC | PRN
  Administered 2012-02-21 (×2): 4 mg via INTRAVENOUS

## 2012-02-21 MED ORDER — MORPHINE SULFATE 2 MG/ML IJ SOLN: 0.0500 mg/kg | INTRAMUSCULAR | Status: DC | PRN

## 2012-02-21 MED ORDER — BISACODYL 10 MG RE SUPP: 10.0000 mg | Freq: Every day | RECTAL | Status: DC | PRN

## 2012-02-21 SURGICAL SUPPLY — 55 items
ADH SKN CLS APL DERMABOND .7 (GAUZE/BANDAGES/DRESSINGS) ×1
ALLOGRAFT CA 6X14X11 (Bone Implant) ×1 IMPLANT
BAG DECANTER FOR FLEXI CONT (MISCELLANEOUS) ×2 IMPLANT
BIT DRILL NEURO 2X3.1 SFT TUCH (MISCELLANEOUS) ×1 IMPLANT
BLADE ULTRA TIP 2M (BLADE) ×2 IMPLANT
BRUSH SCRUB EZ PLAIN DRY (MISCELLANEOUS) ×2 IMPLANT
CANISTER SUCTION 2500CC (MISCELLANEOUS) ×2 IMPLANT
CLOTH BEACON ORANGE TIMEOUT ST (SAFETY) ×2 IMPLANT
CONT SPEC 4OZ CLIKSEAL STRL BL (MISCELLANEOUS) ×2 IMPLANT
COVER MAYO STAND STRL (DRAPES) ×2 IMPLANT
DECANTER SPIKE VIAL GLASS SM (MISCELLANEOUS) ×2 IMPLANT
DERMABOND ADVANCED (GAUZE/BANDAGES/DRESSINGS) ×1
DERMABOND ADVANCED .7 DNX12 (GAUZE/BANDAGES/DRESSINGS) ×1 IMPLANT
DRAPE LAPAROTOMY 100X72 PEDS (DRAPES) ×2 IMPLANT
DRAPE MICROSCOPE LEICA (MISCELLANEOUS) ×2 IMPLANT
DRAPE POUCH INSTRU U-SHP 10X18 (DRAPES) ×2 IMPLANT
DRAPE PROXIMA HALF (DRAPES) IMPLANT
DRILL NEURO 2X3.1 SOFT TOUCH (MISCELLANEOUS) ×2
ELECT COATED BLADE 2.86 ST (ELECTRODE) ×2 IMPLANT
ELECT REM PT RETURN 9FT ADLT (ELECTROSURGICAL) ×2
ELECTRODE REM PT RTRN 9FT ADLT (ELECTROSURGICAL) ×1 IMPLANT
GLOVE BIOGEL PI IND STRL 7.0 (GLOVE) IMPLANT
GLOVE BIOGEL PI IND STRL 8 (GLOVE) ×1 IMPLANT
GLOVE BIOGEL PI INDICATOR 7.0 (GLOVE) ×1
GLOVE BIOGEL PI INDICATOR 8 (GLOVE) ×1
GLOVE ECLIPSE 7.5 STRL STRAW (GLOVE) ×1 IMPLANT
GLOVE EXAM NITRILE LRG STRL (GLOVE) IMPLANT
GLOVE EXAM NITRILE MD LF STRL (GLOVE) ×1 IMPLANT
GLOVE EXAM NITRILE XL STR (GLOVE) IMPLANT
GLOVE EXAM NITRILE XS STR PU (GLOVE) IMPLANT
GLOVE SURG SS PI 6.5 STRL IVOR (GLOVE) ×3 IMPLANT
GLOVE SURG SS PI 7.5 STRL IVOR (GLOVE) ×2 IMPLANT
GLOVE SURG SS PI 8.0 STRL IVOR (GLOVE) ×1 IMPLANT
GOWN BRE IMP SLV AUR LG STRL (GOWN DISPOSABLE) IMPLANT
GOWN BRE IMP SLV AUR XL STRL (GOWN DISPOSABLE) ×3 IMPLANT
GOWN STRL REIN 2XL LVL4 (GOWN DISPOSABLE) IMPLANT
HEAD HALTER (SOFTGOODS) ×2 IMPLANT
KIT BASIN OR (CUSTOM PROCEDURE TRAY) ×2 IMPLANT
KIT ROOM TURNOVER OR (KITS) ×2 IMPLANT
NDL HYPO 25X1 1.5 SAFETY (NEEDLE) ×1 IMPLANT
NDL SPNL 22GX3.5 QUINCKE BK (NEEDLE) ×1 IMPLANT
NEEDLE HYPO 25X1 1.5 SAFETY (NEEDLE) ×2 IMPLANT
NEEDLE SPNL 22GX3.5 QUINCKE BK (NEEDLE) ×2 IMPLANT
NS IRRIG 1000ML POUR BTL (IV SOLUTION) ×2 IMPLANT
PACK LAMINECTOMY NEURO (CUSTOM PROCEDURE TRAY) ×2 IMPLANT
PAD ARMBOARD 7.5X6 YLW CONV (MISCELLANEOUS) ×6 IMPLANT
RUBBERBAND STERILE (MISCELLANEOUS) ×4 IMPLANT
SPONGE INTESTINAL PEANUT (DISPOSABLE) ×2 IMPLANT
SPONGE SURGIFOAM ABS GEL SZ50 (HEMOSTASIS) ×2 IMPLANT
SUT VIC AB 2-0 CP2 18 (SUTURE) ×2 IMPLANT
SUT VIC AB 3-0 SH 8-18 (SUTURE) ×2 IMPLANT
SYR 20ML ECCENTRIC (SYRINGE) ×2 IMPLANT
TOWEL OR 17X24 6PK STRL BLUE (TOWEL DISPOSABLE) IMPLANT
TOWEL OR 17X26 10 PK STRL BLUE (TOWEL DISPOSABLE) ×2 IMPLANT
WATER STERILE IRR 1000ML POUR (IV SOLUTION) ×2 IMPLANT

## 2012-02-21 NOTE — Anesthesia Procedure Notes (Signed)
Procedure Name: Intubation Date/Time: 02/21/2012 8:01 AM Performed by: Glendora Score A Pre-anesthesia Checklist: Patient identified, Emergency Drugs available, Suction available and Patient being monitored Patient Re-evaluated:Patient Re-evaluated prior to inductionOxygen Delivery Method: Circle system utilized Preoxygenation: Pre-oxygenation with 100% oxygen Intubation Type: IV induction Ventilation: Two handed mask ventilation required and Oral airway inserted - appropriate to patient size Tube type: Oral Tube size: 7.5 mm Number of attempts: 2 Airway Equipment and Method: Stylet and Video-laryngoscopy Placement Confirmation: ETT inserted through vocal cords under direct vision,  positive ETCO2 and breath sounds checked- equal and bilateral Secured at: 23 cm Tube secured with: Tape Dental Injury: Teeth and Oropharynx as per pre-operative assessment  Difficulty Due To: Difficulty was unanticipated and Difficult Airway- due to immobile epiglottis Future Recommendations: Recommend- induction with short-acting agent, and alternative techniques readily available Comments: Neck immobility - glidescope used, along with 2 handed/2 person mask technique. Difficulty due to previous posterior neck fusion

## 2012-02-21 NOTE — Anesthesia Preprocedure Evaluation (Addendum)
Anesthesia Evaluation  Patient identified by MRN, date of birth, ID band Patient awake    Reviewed: Allergy & Precautions, H&P , NPO status , Patient's Chart, lab work & pertinent test results, reviewed documented beta blocker date and time   Airway Mallampati: I TM Distance: >3 FB Neck ROM: Full    Dental  (+) Teeth Intact   Pulmonary asthma , sleep apnea ,  breath sounds clear to auscultation        Cardiovascular hypertension, Pt. on medications Rhythm:Regular Rate:Normal  EF - 55-60%   Neuro/Psych Seizures -, Well Controlled,  On keppra    GI/Hepatic negative GI ROS, Neg liver ROS,   Endo/Other  Hypothyroidism   Renal/GU negative Renal ROS     Musculoskeletal   Abdominal   Peds  Hematology negative hematology ROS (+)   Anesthesia Other Findings   Reproductive/Obstetrics                         Anesthesia Physical Anesthesia Plan  ASA: III  Anesthesia Plan: General   Post-op Pain Management:    Induction: Intravenous  Airway Management Planned: Oral ETT  Additional Equipment:   Intra-op Plan:   Post-operative Plan: Extubation in OR  Informed Consent: I have reviewed the patients History and Physical, chart, labs and discussed the procedure including the risks, benefits and alternatives for the proposed anesthesia with the patient or authorized representative who has indicated his/her understanding and acceptance.   Dental advisory given  Plan Discussed with: CRNA, Anesthesiologist and Surgeon  Anesthesia Plan Comments:         Anesthesia Quick Evaluation

## 2012-02-21 NOTE — H&P (Signed)
Subjective: Patient is a 61 y.o. male who is admitted for treatment of cervical stenosis secondary to cervical disc herniation, cervical spondylosis, cervical degenerative disease, with evidence of myelopathy with MRI evidence of increasing within the spinal cord at the C5-6 level. Patient is 11 months status post a C3-C5 posterior cervical arthrodesis and a C3-C5 cervical laminectomy for decompression of severe multilevel cervical stenosis with myelopathy. MRI done about 3 months ago during hospitalization by another service for another condition revealed excellent decompression of the C3-4 and C4-5 levels but there was focal stenosis at the C5-6 level with increased signal in the spinal cord at that level, that was not clearly seen on the MRI a year ago. CT of the cervical spine was performed which revealed fusion at both C3-4 and C4-5. The patient is admitted now for a single level C5-6 and her cervical decompression and arthrodesis.   Patient Active Problem List  Diagnoses Date Noted  . Acute on chronic diastolic heart failure 11/22/2011  . Dilantin toxicity 11/14/2011  . Dehydration 11/14/2011  . Dysarthria 11/14/2011  . Hypothermia 11/14/2011  . Hypothyroidism 11/14/2011  . HTN (hypertension) 11/14/2011  . Seizure disorder 11/14/2011   Past Medical History  Diagnosis Date  . Hypertension   . High cholesterol   . Hypothyroidism   . Seizure disorder   . Sleep apnea, obstructive     uses cpap machine .9  . Sleep apnea, obstructive   . Kidney stone     hx of  . Cancer     skin ca to scalp, sees Dr. Danella Deis  . Arthritis     left knee  . Cataract     bilateral  . Rash, skin     to left lower leg   . Asthma     "outgrew it from place I used to work at"  . Seizures 1966    "started as gran mal; lessened to no loss of consciousness nowl last ~ 2000" patient sees Dr. Lesia Sago    Past Surgical History  Procedure Date  . Cervical discectomy 03/2011  . Eye surgery [phone]   "right eye; 4 times total; for detached retina"  . Cataract extraction w/ intraocular lens  implant, bilateral 1990's  . Retinal detachment surgery     x 4 right eye    Prescriptions prior to admission  Medication Sig Dispense Refill  . aspirin EC 81 MG tablet Take 81 mg by mouth daily.        Marland Kitchen atorvastatin (LIPITOR) 40 MG tablet Take 40 mg by mouth every evening.        . benazepril (LOTENSIN) 20 MG tablet Take 20 mg by mouth every evening.       . levETIRAcetam (KEPPRA) 500 MG tablet Take 750 mg by mouth 2 (two) times daily.       Marland Kitchen levothyroxine (SYNTHROID, LEVOTHROID) 100 MCG tablet Take 300 mcg by mouth daily.        . metoprolol tartrate (LOPRESSOR) 25 MG tablet Take 25 mg by mouth 2 (two) times daily.        . Multiple Vitamin (MULITIVITAMIN WITH MINERALS) TABS Take 1 tablet by mouth daily.        Marland Kitchen PHENobarbital (LUMINAL) 32.4 MG tablet Take 32.4 mg by mouth every evening.        Marland Kitchen PHENobarbital (LUMINAL) 64.8 MG tablet Take 129.6 mg by mouth every evening.        Allergies  Allergen Reactions  . Latex Rash  Hands rash and  mild hives    History  Substance Use Topics  . Smoking status: Never Smoker   . Smokeless tobacco: Never Used  . Alcohol Use: Yes     11/18/11 "been awhile since I've had anything to drink"    Family History  Problem Relation Age of Onset  . Hypertension Mother   . Hypertension Father      Review of Systems A comprehensive review of systems was negative.  Objective: Vital signs in last 24 hours: Temp:  [97.8 F (36.6 C)] 97.8 F (36.6 C) (04/01 0605) Pulse Rate:  [74] 74  (04/01 0605) Resp:  [18] 18  (04/01 0605) BP: (152)/(79) 152/79 mmHg (04/01 0605) SpO2:  [95 %] 95 % (04/01 0605) Weight:  [134.1 kg (295 lb 10.2 oz)] 134.1 kg (295 lb 10.2 oz) (04/01 0700)  EXAM: Patient is obese white male in no acute distress. Lungs are clear to auscultation , the patient has symmetrical respiratory excursion. Heart has a regular rate and rhythm  normal S1 and S2 no murmur.   Abdomen is soft nontender nondistended bowel sounds are present. Extremity examination shows no clubbing or cyanosis, but extensive edema in the lower extremities bilaterally. Neurologic examination shows evidence of quadriparesis. There is intact sensation to pinprick in his digits of his hands. Reflexes are diminished except for the triceps. Toes are upgoing. Gait and stance shows mild spasticity, with a widened stance and gait.  Data Review:CBC    Component Value Date/Time   WBC 6.9 02/14/2012 1235   RBC 4.34 02/14/2012 1235   HGB 13.5 02/14/2012 1235   HCT 40.5 02/14/2012 1235   PLT 179 02/14/2012 1235   MCV 93.3 02/14/2012 1235   MCH 31.1 02/14/2012 1235   MCHC 33.3 02/14/2012 1235   RDW 13.2 02/14/2012 1235   LYMPHSABS 1.2 11/14/2011 1256   MONOABS 0.6 11/14/2011 1256   EOSABS 0.2 11/14/2011 1256   BASOSABS 0.0 11/14/2011 1256                          BMET    Component Value Date/Time   NA 138 02/14/2012 1235   K 3.9 02/14/2012 1235   CL 103 02/14/2012 1235   CO2 25 02/14/2012 1235   GLUCOSE 79 02/14/2012 1235   BUN 15 02/14/2012 1235   CREATININE 0.79 02/14/2012 1235   CALCIUM 9.9 02/14/2012 1235   GFRNONAA >90 02/14/2012 1235   GFRAA >90 02/14/2012 1235     Assessment/Plan: Patient with spastic quadriparesis with evidence of myelopathy clinically and radiologically with stenosis seen at the C5-6 level sooner spondylitic disc herniation was admitted for a single level C5-6 anterior cervical decompression arthrodesis with allograft and cervical plating. I've discussed with the patient the nature of his condition, the nature the surgical procedure, the typical length of surgery, hospital stay, and overall recuperation. We discussed limitations postoperatively. I discussed risks of surgery including risks of infection, bleeding, possibly need for transfusion, the risk of nerve root dysfunction with pain, weakness, numbness, or paresthesias, the risk of spinal cord  dysfunction with paralysis of all 4 limbs and quadriplegia, and the risk of dural tear and CSF leakage and possible need for further surgery, the risk of esophageal dysfunction causing dysphagia and the risk of laryngeal dysfunction causing hoarseness of the voice, the risk of failure of the arthrodesis and the possible need for further surgery, and the risk of anesthetic complications including myocardial infarction, stroke, pneumonia, and death.  We also discussed the need for postoperative immobilization in a cervical collar. Understanding all this the patient does wish to proceed with surgery and is admitted for such.    Hewitt Shorts, MD 02/21/2012 7:36 AM

## 2012-02-21 NOTE — Op Note (Signed)
02/21/2012  10:23 AM  PATIENT:  [name]  61 y.o. male  PRE-OPERATIVE DIAGNOSIS:  cervical stenosis, cervical spondylosis with myelopathy, cervical hernaited disc with myelopathy, quadriparesis  POST-OPERATIVE DIAGNOSIS:  cervical stenosis, cervical spondylosis with myelopathy, cervical hernaited disc with myelopathy, quadriparesis  PROCEDURE:  Procedure(s): ANTERIOR CERVICAL DECOMPRESSION/DISCECTOMY FUSION 1 LEVEL: C5-6 anterior cervical decompression and arthrodesis with allograft and tether cervical plating.  SURGEON:  Surgeon(s): Hewitt Shorts, MD Reinaldo Meeker, MD  ASSISTANTS: Reinaldo Meeker, M.D.  ANESTHESIA:   general  EBL:  Total I/O In: 2000 [I.V.:2000] Out: -   BLOOD ADMINISTERED:none  COUNT: Correct per nursing staff  DICTATION: Patient was brought to the operating room placed under general endotracheal anesthesia. Patient was placed in 10 pounds of halter traction. The neck was prepped with Betadine soap and solution and draped in a sterile fashion. A horizontal incision was made on the left side of the neck. The line of the incision was infiltrated with local anesthetic with epinephrine. Dissection was carried down thru the subcutaneous tissue and platysma, bipolar cautery was used to maintain hemostasis. Dissection was then carried out thru an avascular plane leaving the sternocleidomastoid carotid artery and jugular vein laterally and the trachea and esophagus medially. The ventral aspect of the vertebral column was identified and a localizing x-ray was taken. The C5-6 level was identified. There was large anterior osteophytic overgrowth that was removed using the high-speed drill and Kerrison punches. The annulus was incised and the disc space entered. Discectomy was performed with micro-curettes and pituitary rongeurs. The operating microscope was draped and brought into the field provided additional magnification illumination and visualization. Discectomy  was continued posteriorly thru the disc space and then the cartilaginous endplate was removed using micro-curettes along with the high-speed drill. Posterior prosthetic overgrowth was removed using the high-speed drill along with a 2 mm thin footplate Kerrison punch. Posterior longitudinal ligament along with disc herniation was carefully removed, decompressing the spinal canal and thecal sac. We then continued to remove osteophytic overgrowth and disc material decompressing the neural foramina and exiting nerve roots bilaterally. Once the decompression was completed hemostasis was established with the use of Gelfoam with thrombin. The Gelfoam was removed the wound irrigated and hemostasis confirmed. We then measured the height of the intravertebral disc space and selected a 6 millimeter in height structural allograft. It was hydrated and saline solution and then gently positioned in the intravertebral disc space and countersunk. We then selected a 12 millimeter in height Tether cervical plate. It was positioned over the fusion construct and secured to the vertebra with 4 x 15 mm screws. Each screw hole was started with the high-speed drill and then the screws placed once all the screws were placed final tightening was performed. We used 2 fixed screws at C6 and 2 variable screws at C5. The wound was irrigated with bacitracin solution checked for hemostasis which was established and confirmed. An x-ray was taken which showed the graft in good position and the plate and screws in good position and the overall alignment to be good . We then proceeded with closure. The platysma was closed with interrupted inverted 2-0 undyed Vicryl suture, the subcutaneous and subcuticular closed with interrupted inverted 3-0 undyed Vicryl suture. The skin edges were approximated with Dermabond. Following surgery the patient was taken out of cervical traction. To be reversed and the anesthetic and taken to the recovery room for further  care.   PLAN OF CARE: Admit for overnight observation  PATIENT DISPOSITION:  PACU - hemodynamically stable.   Delay start of Pharmacological VTE agent (>24hrs) due to surgical blood loss or risk of bleeding:  yes

## 2012-02-21 NOTE — Preoperative (Signed)
Beta Blockers   Reason not to administer Beta Blockers:Not Applicable 

## 2012-02-21 NOTE — Progress Notes (Signed)
Filed Vitals:   02/21/12 1115 02/21/12 1320 02/21/12 1525 02/21/12 1715  BP: 170/95 130/71 161/92 158/89  Pulse: 73 74 88 63  Temp: 98 F (36.7 C) 97.4 F (36.3 C) 97.6 F (36.4 C) 97.4 F (36.3 C)  TempSrc: Oral Oral Oral Oral  Resp: 18 20 20 18   Weight:      SpO2: 93% 96% 95% 95%    Patient sitting up in bed, eating dinner. He has ambulate in the halls. He has voided. Incision is clean and dry. Moving all extremities, and he feels that his strength and sensation are both improved as compared to prior to surgery.  Plan: Continue postoperative care.  Hewitt Shorts, MD 02/21/2012, 7:09 PM

## 2012-02-21 NOTE — Transfer of Care (Signed)
Immediate Anesthesia Transfer of Care Note  Patient: [name]  Procedure(s) Performed: Procedure(s) (LRB): ANTERIOR CERVICAL DECOMPRESSION/DISCECTOMY FUSION 1 LEVEL (N/A)  Patient Location: PACU  Anesthesia Type: General  Level of Consciousness: awake, alert  and patient cooperative  Airway & Oxygen Therapy: Patient Spontanous Breathing and Patient connected to face mask oxygen  Post-op Assessment: Report given to PACU RN  Post vital signs: Reviewed and stable  Complications: No apparent anesthesia complications

## 2012-02-21 NOTE — Anesthesia Postprocedure Evaluation (Signed)
  Anesthesia Post-op Note  Patient: [name]  Procedure(s) Performed: Procedure(s) (LRB): ANTERIOR CERVICAL DECOMPRESSION/DISCECTOMY FUSION 1 LEVEL (N/A)  Patient Location: PACU  Anesthesia Type: General  Level of Consciousness: awake  Airway and Oxygen Therapy: Patient Spontanous Breathing  Post-op Pain: mild  Post-op Assessment: Post-op Vital signs reviewed  Post-op Vital Signs: Reviewed  Complications: No apparent anesthesia complications

## 2012-02-22 MED ORDER — HYDROCODONE-ACETAMINOPHEN 5-325 MG PO TABS: 1.0000 | ORAL_TABLET | ORAL | Status: AC | PRN

## 2012-02-22 NOTE — Discharge Instructions (Signed)

## 2012-02-22 NOTE — Discharge Summary (Signed)
Physician Discharge Summary  Patient ID: [name] MRN: [id number] DOB/AGE: [birth year] 61 y.o.  Admit date: 02/21/2012 Discharge date: 02/22/2012  Admission Diagnoses: Cervical stenosis, cervical spondylosis with myelopathy, cervical herniated disc with myelopathy, quadriparesis  Discharge Diagnoses: Cervical stenosis, cervical spondylosis with myelopathy, cervical herniated disc with myelopathy, quadriparesis   Discharged Condition: good  Hospital Course: Patient was admitted underwent a single level C5-6 anterior cervical decompression and arthrodesis with allograft and tether cervical plating. He has done well following surgery, he had been up and living in the halls, eating and drinking well. He is been comfortable, and has only required Toradol, and no narcotic analgesic. His wound is healing well. He is being discharged home with instructions regarding wound care and activities, he is to return for followup with me in 3 weeks in the office.  Discharge Exam: Blood pressure 135/82, pulse 53, temperature 97.6 F (36.4 C), temperature source Axillary, resp. rate 16, weight 134.1 kg (295 lb 10.2 oz), SpO2 100.00%.  Disposition: Home   Medication List  As of 02/22/2012  8:33 AM   TAKE these medications         aspirin EC 81 MG tablet   Take 81 mg by mouth daily.      atorvastatin 40 MG tablet   Commonly known as: LIPITOR   Take 40 mg by mouth every evening.      benazepril 20 MG tablet   Commonly known as: LOTENSIN   Take 20 mg by mouth every evening.      HYDROcodone-acetaminophen 5-325 MG per tablet   Commonly known as: NORCO   Take 1-2 tablets by mouth every 4 (four) hours as needed for pain.      levETIRAcetam 500 MG tablet   Commonly known as: KEPPRA   Take 750 mg by mouth 2 (two) times daily.      levothyroxine 100 MCG tablet   Commonly known as: SYNTHROID, LEVOTHROID   Take 300 mcg by mouth daily.      metoprolol tartrate 25 MG tablet   Commonly known as:  LOPRESSOR   Take 25 mg by mouth 2 (two) times daily.      mulitivitamin with minerals Tabs   Take 1 tablet by mouth daily.      PHENobarbital 32.4 MG tablet   Commonly known as: LUMINAL   Take 32.4 mg by mouth every evening.      PHENobarbital 64.8 MG tablet   Commonly known as: LUMINAL   Take 129.6 mg by mouth every evening.             Signed: Hewitt Shorts, MD 02/22/2012, 8:33 AM

## 2012-02-23 ENCOUNTER — Encounter (HOSPITAL_COMMUNITY): Payer: Self-pay | Admitting: Neurosurgery

## 2013-03-13 ENCOUNTER — Other Ambulatory Visit: Payer: Self-pay | Admitting: Neurology

## 2013-03-18 ENCOUNTER — Other Ambulatory Visit: Payer: Self-pay | Admitting: Neurology

## 2013-03-19 ENCOUNTER — Other Ambulatory Visit: Payer: Self-pay | Admitting: Neurology

## 2013-03-19 MED ORDER — PHENOBARBITAL 64.8 MG PO TABS: 129.6000 mg | ORAL_TABLET | Freq: Every day | ORAL | Status: DC

## 2013-03-19 NOTE — Addendum Note (Signed)
Addended by: Lucille Passy C on: 03/19/2013 03:08 PM   Modules accepted: Orders

## 2013-06-12 ENCOUNTER — Other Ambulatory Visit: Payer: Self-pay | Admitting: Neurology

## 2013-06-13 MED ORDER — PHENOBARBITAL 64.8 MG PO TABS: 129.6000 mg | ORAL_TABLET | Freq: Every day | ORAL | Status: DC

## 2013-06-14 ENCOUNTER — Other Ambulatory Visit: Payer: Self-pay | Admitting: Neurology

## 2013-06-15 ENCOUNTER — Other Ambulatory Visit: Payer: Self-pay | Admitting: Neurology

## 2013-06-18 ENCOUNTER — Other Ambulatory Visit: Payer: Self-pay | Admitting: Neurology

## 2013-06-20 ENCOUNTER — Other Ambulatory Visit: Payer: Self-pay | Admitting: Neurology

## 2013-06-20 NOTE — Telephone Encounter (Signed)
Rx was sent 07/22.  The pharmacy has sent 5+ requests for this same med since then.  I called them, they said they already filled the meds and do not know why they keep sending the request.  They claim they will delete the request from the system.

## 2013-08-02 ENCOUNTER — Encounter: Payer: Self-pay | Admitting: Neurology

## 2013-08-17 ENCOUNTER — Ambulatory Visit (INDEPENDENT_AMBULATORY_CARE_PROVIDER_SITE_OTHER): Payer: Medicare Other | Admitting: Neurology

## 2013-08-17 ENCOUNTER — Encounter: Payer: Self-pay | Admitting: Neurology

## 2013-08-17 VITALS — BP 122/75 | HR 79 | Wt 286.0 lb

## 2013-08-17 DIAGNOSIS — G40909 Epilepsy, unspecified, not intractable, without status epilepticus: Secondary | ICD-10-CM

## 2013-08-17 DIAGNOSIS — Z5181 Encounter for therapeutic drug level monitoring: Secondary | ICD-10-CM

## 2013-08-17 DIAGNOSIS — R269 Unspecified abnormalities of gait and mobility: Secondary | ICD-10-CM | POA: Insufficient documentation

## 2013-08-17 DIAGNOSIS — M4712 Other spondylosis with myelopathy, cervical region: Secondary | ICD-10-CM

## 2013-08-17 HISTORY — DX: Unspecified abnormalities of gait and mobility: R26.9

## 2013-08-17 HISTORY — DX: Other spondylosis with myelopathy, cervical region: M47.12

## 2013-08-17 NOTE — Progress Notes (Signed)
Reason for visit: Seizures  [name] is an 62 y.o. male  History of present illness:  [name] is a 62 year old right-handed white male with a history of seizures and a cervical myelopathy associated with a gait disorder. The patient has done fairly well since last seen. The patient has had no further seizures, but he has not yet gotten back to driving. The patient is on Keppra and phenobarbital. The patient is still having some clumsiness with his hands and some problems with gait instability following cervical decompression surgery. The patient has not had any falls, and he uses a cane for ambulation. The patient reports no other new significant medical issues that have come up since last seen. The patient is tolerating his Keppra and phenobarbital fairly well.  Past Medical History  Diagnosis Date  . Hypertension   . High cholesterol   . Hypothyroidism   . Seizure disorder   . Sleep apnea, obstructive     uses cpap machine .9  . Sleep apnea, obstructive   . Kidney stone     hx of  . Cancer     skin ca to scalp, sees Dr. Danella Deis  . Arthritis     left knee  . Cataract     bilateral  . Rash, skin     to left lower leg   . Asthma     "outgrew it from place I used to work at"  . Seizures 1966    "started as gran mal; lessened to no loss of consciousness nowl last ~ 2000" patient sees Dr. Lesia Sago  . Peripheral edema   . Gait disorder   . Memory loss   . Cervical myelopathy   . Cervical spondylosis with myelopathy 08/17/2013  . Abnormality of gait 08/17/2013    Past Surgical History  Procedure Laterality Date  . Cervical discectomy  03/2011  . Eye surgery  [phone]    "right eye; 4 times total; for detached retina"  . Cataract extraction w/ intraocular lens  implant, bilateral  1990's  . Retinal detachment surgery      x 4 right eye  . Anterior cervical decomp/discectomy fusion  02/21/2012    Procedure: ANTERIOR CERVICAL DECOMPRESSION/DISCECTOMY FUSION 1  LEVEL;  Surgeon: Hewitt Shorts, MD;  Location: [facility];  Service: Neurosurgery;  Laterality: N/A;  Cervical Five-Six anterior cervical decompression with fusion plating and bonegraft    Family History  Problem Relation Age of Onset  . Hypertension Mother   . Heart disease Mother   . Hypertension Father   . Glaucoma Father   . Heart disease Father     Social history:  reports that he has never smoked. He has never used smokeless tobacco. He reports that  drinks alcohol. He reports that he does not use illicit drugs.    Allergies  Allergen Reactions  . Depakote [Divalproex Sodium]     Intolerant  . Latex Rash    Hands rash and  mild hives    Medications:  Current Outpatient Prescriptions on File Prior to Visit  Medication Sig Dispense Refill  . aspirin EC 81 MG tablet Take 81 mg by mouth daily.        Marland Kitchen atorvastatin (LIPITOR) 40 MG tablet Take 40 mg by mouth every evening.        . benazepril (LOTENSIN) 20 MG tablet Take 20 mg by mouth every evening.       . hydrochlorothiazide (HYDRODIURIL) 25 MG tablet Take 25 mg  by mouth daily.      Marland Kitchen levETIRAcetam (KEPPRA) 750 MG tablet ONE TABLET TWICE A DAY  180 tablet  1  . metoprolol tartrate (LOPRESSOR) 25 MG tablet Take 25 mg by mouth 2 (two) times daily.        . Multiple Vitamin (MULITIVITAMIN WITH MINERALS) TABS Take 1 tablet by mouth daily.        Marland Kitchen PHENobarbital (LUMINAL) 32.4 MG tablet TAKE 1 TABLET EVERY DAY  90 tablet  1  . PHENobarbital (LUMINAL) 64.8 MG tablet Take 2 tablets (129.6 mg total) by mouth at bedtime.  180 tablet  1  . [DISCONTINUED] furosemide (LASIX) 40 MG tablet Take 1 tablet (40 mg total) by mouth daily.  30 tablet    . [DISCONTINUED] phenytoin (DILANTIN) 100 MG ER capsule Take 1 capsule (100 mg total) by mouth 2 (two) times daily.      . [DISCONTINUED] potassium chloride (K-DUR) 10 MEQ tablet Take 1 tablet (10 mEq total) by mouth daily.       No current facility-administered medications on file prior  to visit.    ROS:  Out of a complete 14 system review of symptoms, the patient complains only of the following symptoms, and all other reviewed systems are negative.  Swelling in the legs Joint pain Numbness, seizures Gait instability  Blood pressure 122/75, pulse 79, weight 286 lb (129.729 kg).  Physical Exam  General: The patient is alert and cooperative at the time of the examination. The patient is moderately to markedly obese.  Skin: The patient has 3+ edema below the knees bilaterally.   Neurologic Exam  Cranial nerves: Facial symmetry is present. Speech is normal, no aphasia or dysarthria is noted. Extraocular movements are full. Visual fields are full.  Motor: The patient has good strength in all 4 extremities.  Coordination: The patient has mild dysmetria with finger-nose-finger bilaterally. The patient has some difficulty with heel-to-shin bilaterally.  Gait and station: The patient has a wide-based gait, the patient uses a cane for ambulation. Tandem gait was not attempted. Romberg is negative. No drift is seen.  Reflexes: Deep tendon reflexes are symmetric.   Assessment/Plan:  1. History seizures  2. Cervical myelopathy  3. Gait disorder   The patient is at his usual neurologic baseline. The patient will be continued on the Keppra and the phenobarbital. Blood work will be done. The patient will followup in one year.  Marlan Palau MD 08/17/2013 7:34 PM  [facility] [address] [place], [place] [address]  Phone [phone] Fax [phone]

## 2013-08-28 ENCOUNTER — Other Ambulatory Visit: Payer: Self-pay | Admitting: Neurology

## 2013-12-04 ENCOUNTER — Other Ambulatory Visit: Payer: Self-pay | Admitting: Neurology

## 2013-12-04 MED ORDER — PHENOBARBITAL 64.8 MG PO TABS: 129.6000 mg | ORAL_TABLET | Freq: Every day | ORAL | Status: DC

## 2014-04-03 ENCOUNTER — Encounter: Payer: Self-pay | Admitting: Neurology

## 2014-06-04 ENCOUNTER — Other Ambulatory Visit: Payer: Self-pay | Admitting: Neurology

## 2014-06-04 NOTE — Telephone Encounter (Signed)
Rx signed and faxed.

## 2014-08-16 ENCOUNTER — Ambulatory Visit: Payer: Medicare Other | Admitting: Neurology

## 2014-08-19 ENCOUNTER — Other Ambulatory Visit: Payer: Self-pay | Admitting: Neurology

## 2014-08-28 ENCOUNTER — Encounter: Payer: Self-pay | Admitting: Neurology

## 2014-08-28 ENCOUNTER — Ambulatory Visit (INDEPENDENT_AMBULATORY_CARE_PROVIDER_SITE_OTHER): Payer: Medicare Other | Admitting: Neurology

## 2014-08-28 VITALS — BP 128/75 | HR 82 | Wt 291.6 lb

## 2014-08-28 DIAGNOSIS — M4712 Other spondylosis with myelopathy, cervical region: Secondary | ICD-10-CM

## 2014-08-28 DIAGNOSIS — G40909 Epilepsy, unspecified, not intractable, without status epilepticus: Secondary | ICD-10-CM

## 2014-08-28 DIAGNOSIS — R269 Unspecified abnormalities of gait and mobility: Secondary | ICD-10-CM

## 2014-08-28 DIAGNOSIS — Z5181 Encounter for therapeutic drug level monitoring: Secondary | ICD-10-CM

## 2014-08-28 MED ORDER — LEVETIRACETAM 750 MG PO TABS: 750.0000 mg | ORAL_TABLET | Freq: Two times a day (BID) | ORAL | Status: DC

## 2014-08-28 NOTE — Patient Instructions (Signed)

## 2014-08-28 NOTE — Progress Notes (Signed)
Reason for visit: Seizures  [name] is an 63 y.o. male  History of present illness:  [name] is a 63 year old right-handed white male with a history of a chronic seizure disorder, currently on phenobarbital and Keppra. The patient has done well since last seen without recurrence of seizures. The patient does not operate a motor vehicle. 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 The patient reports some residual tingling in the hands, occasionally in the feet. The patient reports no falls since last seen, however. The patient walks with a cane when he is outside the house. The patient denies any new medical issues that have come up since last seen. He is tolerating the medications well. He indicates that the bowels and bladder are functioning well.  Past Medical History  Diagnosis Date  . Hypertension   . High cholesterol   . Hypothyroidism   . Seizure disorder   . Sleep apnea, obstructive     uses cpap machine .9  . Sleep apnea, obstructive   . Kidney stone     hx of  . Cancer     skin ca to scalp, sees Dr. Tonia Brooms  . Arthritis     left knee  . Cataract     bilateral  . Rash, skin     to left lower leg   . Asthma     "outgrew it from place I used to work at"  . Seizures 1966    "started as gran mal; lessened to no loss of consciousness nowl last ~ 2000" patient sees Dr. Floyde Parkins  . Peripheral edema   . Gait disorder   . Memory loss   . Cervical myelopathy   . Cervical spondylosis with myelopathy 08/17/2013  . Abnormality of gait 08/17/2013    Past Surgical History  Procedure Laterality Date  . Cervical discectomy  03/2011  . Eye surgery  [phone]    "right eye; 4 times total; for detached retina"  . Cataract extraction w/ intraocular lens  implant, bilateral  1990's  . Retinal detachment surgery      x 4 right eye  . Anterior cervical  decomp/discectomy fusion  02/21/2012    Procedure: ANTERIOR CERVICAL DECOMPRESSION/DISCECTOMY FUSION 1 LEVEL;  Surgeon: Hosie Spangle, MD;  Location: [facility];  Service: Neurosurgery;  Laterality: N/A;  Cervical Five-Six anterior cervical decompression with fusion plating and bonegraft    Family History  Problem Relation Age of Onset  . Hypertension Mother   . Heart disease Mother   . Hypertension Father   . Glaucoma Father   . Heart disease Father     Social history:  reports that he has never smoked. He has never used smokeless tobacco. He reports that he drinks alcohol. He reports that he does not use illicit drugs.    Allergies  Allergen Reactions  . Depakote [Divalproex Sodium]     Intolerant  . Latex Rash    Hands rash and  mild hives    Medications:  Current Outpatient Prescriptions on File Prior to Visit  Medication Sig Dispense Refill  . aspirin EC 81 MG tablet Take 81 mg by mouth daily.        Marland Kitchen atorvastatin (LIPITOR) 40 MG tablet Take 40 mg by mouth every evening.        . benazepril (LOTENSIN) 20 MG tablet Take 20 mg by mouth every evening.       Marland Kitchen  hydrochlorothiazide (HYDRODIURIL) 25 MG tablet Take 25 mg by mouth daily.      Marland Kitchen levothyroxine (SYNTHROID, LEVOTHROID) 150 MCG tablet Take 150 mcg by mouth daily before breakfast.      . metoprolol tartrate (LOPRESSOR) 25 MG tablet Take 25 mg by mouth 2 (two) times daily.        . Multiple Vitamin (MULITIVITAMIN WITH MINERALS) TABS Take 1 tablet by mouth daily.        Marland Kitchen PHENobarbital (LUMINAL) 32.4 MG tablet TAKE 1 TABLET EVERY DAY  90 tablet  1  . PHENobarbital (LUMINAL) 64.8 MG tablet TAKE 2 TABLETS BY MOUTH AT BEDTIME  180 tablet  1  . ZOSTAVAX 91638 UNT/0.65ML injection Inject 0.65 mLs into the skin.      . [DISCONTINUED] furosemide (LASIX) 40 MG tablet Take 1 tablet (40 mg total) by mouth daily.  30 tablet    . [DISCONTINUED] phenytoin (DILANTIN) 100 MG ER capsule Take 1 capsule (100 mg total) by mouth 2 (two)  times daily.      . [DISCONTINUED] potassium chloride (K-DUR) 10 MEQ tablet Take 1 tablet (10 mEq total) by mouth daily.       No current facility-administered medications on file prior to visit.    ROS:  Out of a complete 14 system review of symptoms, the patient complains only of the following symptoms, and all other reviewed systems are negative.  Leg swelling Gait instability  Blood pressure 128/75, pulse 82, weight 291 lb 9.6 oz (132.269 kg).  Physical Exam  General: The patient is alert and cooperative at the time of the examination. The patient is moderately to markedly obese.  Skin: 3+ edema below the knees is noted bilaterally.   Neurologic Exam  Mental status: The patient is oriented x 3.  Cranial nerves: Facial symmetry is present. Speech is normal, no aphasia or dysarthria is noted. Extraocular movements are full. Visual fields are full.  Motor: The patient has good strength in all 4 extremities.  Sensory examination: Soft touch sensation is symmetric on the face, arms, and legs.  Coordination: The patient has good finger-nose-finger and heel-to-shin bilaterally.  Gait and station: The patient has a slightly wide-based, unsteady gait. The patient uses a cane for ambulation. Tandem gait was not attempted. Romberg is negative. No drift is seen.  Reflexes: Deep tendon reflexes are symmetric, but are depressed.   Assessment/Plan:  1. History of seizures, under good control  2. Cervical myelopathy, gait disorder  The patient is being relatively safe with his walking, and he is doing well with his seizure control. The patient will continue his current medications, and he will have blood work done today. He will followup in one year or as needed. He is to contact our office if new issues arise. A prescription for the Keppra was called in.  Jill Alexanders MD 08/28/2014 10:22 PM  [facility] [address] [place],   [address]  Phone [phone] Fax [phone]

## 2014-08-29 ENCOUNTER — Telehealth: Payer: Self-pay | Admitting: Neurology

## 2014-08-29 LAB — COMPREHENSIVE METABOLIC PANEL
A/G RATIO: 1.6 (ref 1.1–2.5)
ALBUMIN: 4.3 g/dL (ref 3.6–4.8)
ALK PHOS: 162 IU/L — AB (ref 39–117)
ALT: 16 IU/L (ref 0–44)
AST: 28 IU/L (ref 0–40)
BILIRUBIN TOTAL: 0.2 mg/dL (ref 0.0–1.2)
BUN / CREAT RATIO: 14 (ref 10–22)
BUN: 18 mg/dL (ref 8–27)
CO2: 24 mmol/L (ref 18–29)
CREATININE: 1.32 mg/dL — AB (ref 0.76–1.27)
Calcium: 9.6 mg/dL (ref 8.6–10.2)
Chloride: 98 mmol/L (ref 97–108)
GFR calc non Af Amer: 58 mL/min/{1.73_m2} — ABNORMAL LOW (ref >59)
GFR, EST AFRICAN AMERICAN: 67 mL/min/{1.73_m2} (ref >59)
GLOBULIN, TOTAL: 2.7 g/dL (ref 1.5–4.5)
Glucose: 90 mg/dL (ref 65–99)
Potassium: 4.3 mmol/L (ref 3.5–5.2)
Sodium: 138 mmol/L (ref 134–144)
Total Protein: 7 g/dL (ref 6.0–8.5)

## 2014-08-29 LAB — CBC WITH DIFFERENTIAL
Basophils Absolute: 0 10*3/uL (ref 0.0–0.2)
Basos: 1 %
EOS: 4 %
Eosinophils Absolute: 0.2 10*3/uL (ref 0.0–0.4)
HCT: 40.9 % (ref 37.5–51.0)
Hemoglobin: 13.7 g/dL (ref 12.6–17.7)
IMMATURE GRANULOCYTES: 0 %
Immature Grans (Abs): 0 10*3/uL (ref 0.0–0.1)
LYMPHS ABS: 1.6 10*3/uL (ref 0.7–3.1)
Lymphs: 30 %
MCH: 30.5 pg (ref 26.6–33.0)
MCHC: 33.5 g/dL (ref 31.5–35.7)
MCV: 91 fL (ref 79–97)
MONOS ABS: 0.6 10*3/uL (ref 0.1–0.9)
Monocytes: 12 %
Neutrophils Absolute: 2.8 10*3/uL (ref 1.4–7.0)
Neutrophils Relative %: 53 %
PLATELETS: 231 10*3/uL (ref 150–379)
RBC: 4.49 x10E6/uL (ref 4.14–5.80)
RDW: 13.4 % (ref 12.3–15.4)
WBC: 5.3 10*3/uL (ref 3.4–10.8)

## 2014-08-29 LAB — PHENOBARBITAL LEVEL: Phenobarbital Lvl: 25 ug/mL (ref 15–40)

## 2014-08-29 NOTE — Telephone Encounter (Signed)
I called patient. Blood work is unremarkable with exception that the alkaline phosphatase this level is slightly elevated, and the renal function appears to be dropping from last evaluation. I'll send blood work to the primary care physician.

## 2014-09-06 ENCOUNTER — Other Ambulatory Visit: Payer: Self-pay | Admitting: Neurology

## 2014-09-06 MED ORDER — PHENOBARBITAL 64.8 MG PO TABS: ORAL_TABLET | ORAL | Status: DC

## 2014-09-09 NOTE — Telephone Encounter (Signed)
Rx has been faxed.

## 2014-10-09 ENCOUNTER — Encounter: Payer: Self-pay | Admitting: Neurology

## 2014-10-15 ENCOUNTER — Encounter: Payer: Self-pay | Admitting: Neurology

## 2015-01-18 ENCOUNTER — Other Ambulatory Visit: Payer: Self-pay | Admitting: Neurology

## 2015-04-18 ENCOUNTER — Other Ambulatory Visit: Payer: Self-pay | Admitting: Neurology

## 2015-04-22 ENCOUNTER — Other Ambulatory Visit: Payer: Self-pay | Admitting: Neurology

## 2015-04-22 NOTE — Telephone Encounter (Signed)
Rx's have been signed and faxed

## 2015-04-30 ENCOUNTER — Encounter (HOSPITAL_COMMUNITY): Payer: Self-pay | Admitting: Emergency Medicine

## 2015-04-30 ENCOUNTER — Observation Stay (HOSPITAL_COMMUNITY)
Admission: EM | Admit: 2015-04-30 | Discharge: 2015-05-02 | Disposition: A | Discharge status: Home / Self Care | Payer: Medicare Other | Attending: Internal Medicine | Admitting: Internal Medicine

## 2015-04-30 DIAGNOSIS — Z85828 Personal history of other malignant neoplasm of skin: Secondary | ICD-10-CM | POA: Diagnosis not present

## 2015-04-30 DIAGNOSIS — Z79899 Other long term (current) drug therapy: Secondary | ICD-10-CM | POA: Insufficient documentation

## 2015-04-30 DIAGNOSIS — E039 Hypothyroidism, unspecified: Secondary | ICD-10-CM | POA: Insufficient documentation

## 2015-04-30 DIAGNOSIS — D649 Anemia, unspecified: Secondary | ICD-10-CM | POA: Diagnosis not present

## 2015-04-30 DIAGNOSIS — J45909 Unspecified asthma, uncomplicated: Secondary | ICD-10-CM | POA: Diagnosis not present

## 2015-04-30 DIAGNOSIS — N200 Calculus of kidney: Secondary | ICD-10-CM | POA: Insufficient documentation

## 2015-04-30 DIAGNOSIS — G959 Disease of spinal cord, unspecified: Secondary | ICD-10-CM | POA: Diagnosis not present

## 2015-04-30 DIAGNOSIS — G40909 Epilepsy, unspecified, not intractable, without status epilepticus: Secondary | ICD-10-CM | POA: Insufficient documentation

## 2015-04-30 DIAGNOSIS — R5383 Other fatigue: Secondary | ICD-10-CM | POA: Insufficient documentation

## 2015-04-30 DIAGNOSIS — G4733 Obstructive sleep apnea (adult) (pediatric): Secondary | ICD-10-CM | POA: Diagnosis not present

## 2015-04-30 DIAGNOSIS — I10 Essential (primary) hypertension: Secondary | ICD-10-CM | POA: Diagnosis not present

## 2015-04-30 DIAGNOSIS — R531 Weakness: Secondary | ICD-10-CM | POA: Insufficient documentation

## 2015-04-30 DIAGNOSIS — Z91040 Latex allergy status: Secondary | ICD-10-CM | POA: Insufficient documentation

## 2015-04-30 DIAGNOSIS — M4712 Other spondylosis with myelopathy, cervical region: Secondary | ICD-10-CM | POA: Insufficient documentation

## 2015-04-30 DIAGNOSIS — Z7982 Long term (current) use of aspirin: Secondary | ICD-10-CM | POA: Insufficient documentation

## 2015-04-30 DIAGNOSIS — I959 Hypotension, unspecified: Secondary | ICD-10-CM | POA: Diagnosis present

## 2015-04-30 DIAGNOSIS — N179 Acute kidney failure, unspecified: Principal | ICD-10-CM | POA: Diagnosis present

## 2015-04-30 DIAGNOSIS — M1990 Unspecified osteoarthritis, unspecified site: Secondary | ICD-10-CM | POA: Diagnosis not present

## 2015-04-30 DIAGNOSIS — E780 Pure hypercholesterolemia: Secondary | ICD-10-CM | POA: Diagnosis not present

## 2015-04-30 DIAGNOSIS — Z9981 Dependence on supplemental oxygen: Secondary | ICD-10-CM | POA: Insufficient documentation

## 2015-04-30 DIAGNOSIS — R197 Diarrhea, unspecified: Secondary | ICD-10-CM | POA: Diagnosis not present

## 2015-04-30 DIAGNOSIS — R609 Edema, unspecified: Secondary | ICD-10-CM | POA: Diagnosis not present

## 2015-04-30 DIAGNOSIS — H269 Unspecified cataract: Secondary | ICD-10-CM | POA: Diagnosis not present

## 2015-04-30 LAB — CBC WITH DIFFERENTIAL/PLATELET
Basophils Absolute: 0 10*3/uL (ref 0.0–0.1)
Basophils Relative: 0 % (ref 0–1)
Eosinophils Absolute: 0.1 10*3/uL (ref 0.0–0.7)
Eosinophils Relative: 0 % (ref 0–5)
HEMATOCRIT: 39.2 % (ref 39.0–52.0)
Hemoglobin: 13 g/dL (ref 13.0–17.0)
LYMPHS ABS: 1.3 10*3/uL (ref 0.7–4.0)
LYMPHS PCT: 11 % — AB (ref 12–46)
MCH: 30.5 pg (ref 26.0–34.0)
MCHC: 33.2 g/dL (ref 30.0–36.0)
MCV: 92 fL (ref 78.0–100.0)
MONO ABS: 1.1 10*3/uL — AB (ref 0.1–1.0)
MONOS PCT: 10 % (ref 3–12)
NEUTROS ABS: 9.2 10*3/uL — AB (ref 1.7–7.7)
NEUTROS PCT: 79 % — AB (ref 43–77)
PLATELETS: 272 10*3/uL (ref 150–400)
RBC: 4.26 MIL/uL (ref 4.22–5.81)
RDW: 12.6 % (ref 11.5–15.5)
WBC: 11.7 10*3/uL — AB (ref 4.0–10.5)

## 2015-04-30 LAB — BASIC METABOLIC PANEL
ANION GAP: 9 (ref 5–15)
BUN: 18 mg/dL (ref 6–20)
CO2: 19 mmol/L — AB (ref 22–32)
Calcium: 9.1 mg/dL (ref 8.9–10.3)
Chloride: 107 mmol/L (ref 101–111)
Creatinine, Ser: 1.97 mg/dL — ABNORMAL HIGH (ref 0.61–1.24)
GFR calc Af Amer: 40 mL/min — ABNORMAL LOW (ref >60)
GFR calc non Af Amer: 35 mL/min — ABNORMAL LOW (ref >60)
Glucose, Bld: 117 mg/dL — ABNORMAL HIGH (ref 65–99)
POTASSIUM: 4.3 mmol/L (ref 3.5–5.1)
Sodium: 135 mmol/L (ref 135–145)

## 2015-04-30 LAB — CLOSTRIDIUM DIFFICILE BY PCR: Toxigenic C. Difficile by PCR: NEGATIVE

## 2015-04-30 LAB — CBC
HCT: 39.5 % (ref 39.0–52.0)
Hemoglobin: 13.3 g/dL (ref 13.0–17.0)
MCH: 30.9 pg (ref 26.0–34.0)
MCHC: 33.7 g/dL (ref 30.0–36.0)
MCV: 91.9 fL (ref 78.0–100.0)
Platelets: 276 10*3/uL (ref 150–400)
RBC: 4.3 MIL/uL (ref 4.22–5.81)
RDW: 12.7 % (ref 11.5–15.5)
WBC: 10.3 10*3/uL (ref 4.0–10.5)

## 2015-04-30 LAB — TROPONIN I: Troponin I: <0.03 ng/mL (ref <0.031)

## 2015-04-30 LAB — I-STAT CG4 LACTIC ACID, ED: Lactic Acid, Venous: 1.89 mmol/L (ref 0.5–2.0)

## 2015-04-30 LAB — MAGNESIUM: Magnesium: 1.8 mg/dL (ref 1.7–2.4)

## 2015-04-30 LAB — CREATININE, SERUM
CREATININE: 2.02 mg/dL — AB (ref 0.61–1.24)
GFR calc non Af Amer: 34 mL/min — ABNORMAL LOW (ref >60)
GFR, EST AFRICAN AMERICAN: 39 mL/min — AB (ref >60)

## 2015-04-30 LAB — TSH: TSH: 0.021 u[IU]/mL — ABNORMAL LOW (ref 0.350–4.500)

## 2015-04-30 MED ORDER — LACTATED RINGERS IV SOLN: INTRAVENOUS | Status: DC

## 2015-04-30 MED ORDER — LEVOTHYROXINE SODIUM 150 MCG PO TABS
300.0000 ug | ORAL_TABLET | ORAL | Status: DC
  Administered 2015-05-01: 300 ug via ORAL
  Filled 2015-04-30 (×2): qty 2

## 2015-04-30 MED ORDER — POTASSIUM CHLORIDE IN NACL 20-0.9 MEQ/L-% IV SOLN
INTRAVENOUS | Status: DC
  Filled 2015-04-30 (×2): qty 1000

## 2015-04-30 MED ORDER — ASPIRIN EC 81 MG PO TBEC
81.0000 mg | DELAYED_RELEASE_TABLET | Freq: Every day | ORAL | Status: DC
  Administered 2015-04-30 – 2015-05-02 (×3): 81 mg via ORAL
  Filled 2015-04-30 (×3): qty 1

## 2015-04-30 MED ORDER — ONDANSETRON HCL 4 MG PO TABS: 4.0000 mg | ORAL_TABLET | Freq: Four times a day (QID) | ORAL | Status: DC | PRN

## 2015-04-30 MED ORDER — SODIUM CHLORIDE 0.9 % IV BOLUS (SEPSIS)
1000.0000 mL | Freq: Once | INTRAVENOUS | Status: AC
  Administered 2015-04-30: 1000 mL via INTRAVENOUS

## 2015-04-30 MED ORDER — SODIUM CHLORIDE 0.9 % IV SOLN
INTRAVENOUS | Status: AC
  Administered 2015-05-01: 06:00:00 via INTRAVENOUS

## 2015-04-30 MED ORDER — PHENOBARBITAL 32.4 MG PO TABS
32.4000 mg | ORAL_TABLET | Freq: Every day | ORAL | Status: DC
  Administered 2015-05-01 – 2015-05-02 (×2): 32.4 mg via ORAL
  Filled 2015-04-30 (×2): qty 1

## 2015-04-30 MED ORDER — LEVOTHYROXINE SODIUM 150 MCG PO TABS
450.0000 ug | ORAL_TABLET | ORAL | Status: DC
  Administered 2015-05-02: 450 ug via ORAL
  Filled 2015-04-30 (×2): qty 3

## 2015-04-30 MED ORDER — ONDANSETRON HCL 4 MG/2ML IJ SOLN: 4.0000 mg | Freq: Four times a day (QID) | INTRAMUSCULAR | Status: DC | PRN

## 2015-04-30 MED ORDER — SODIUM CHLORIDE 0.9 % IV BOLUS (SEPSIS)
500.0000 mL | Freq: Once | INTRAVENOUS | Status: AC
  Administered 2015-04-30: 500 mL via INTRAVENOUS

## 2015-04-30 MED ORDER — HEPARIN SODIUM (PORCINE) 5000 UNIT/ML IJ SOLN
5000.0000 [IU] | Freq: Three times a day (TID) | INTRAMUSCULAR | Status: DC
  Administered 2015-04-30 – 2015-05-02 (×5): 5000 [IU] via SUBCUTANEOUS
  Filled 2015-04-30 (×9): qty 1

## 2015-04-30 MED ORDER — ACETAMINOPHEN 325 MG PO TABS: 650.0000 mg | ORAL_TABLET | Freq: Four times a day (QID) | ORAL | Status: DC | PRN

## 2015-04-30 MED ORDER — LEVETIRACETAM 750 MG PO TABS
750.0000 mg | ORAL_TABLET | Freq: Two times a day (BID) | ORAL | Status: DC
  Administered 2015-04-30 – 2015-05-02 (×4): 750 mg via ORAL
  Filled 2015-04-30 (×5): qty 1

## 2015-04-30 MED ORDER — LEVALBUTEROL HCL 0.63 MG/3ML IN NEBU
0.6300 mg | INHALATION_SOLUTION | Freq: Four times a day (QID) | RESPIRATORY_TRACT | Status: DC | PRN
  Filled 2015-04-30: qty 3

## 2015-04-30 MED ORDER — SODIUM CHLORIDE 0.9 % IJ SOLN
3.0000 mL | Freq: Two times a day (BID) | INTRAMUSCULAR | Status: DC
  Administered 2015-04-30: 3 mL via INTRAVENOUS

## 2015-04-30 MED ORDER — ACETAMINOPHEN 650 MG RE SUPP: 650.0000 mg | Freq: Four times a day (QID) | RECTAL | Status: DC | PRN

## 2015-04-30 MED ORDER — LEVOTHYROXINE SODIUM 150 MCG PO TABS: 300.0000 ug | ORAL_TABLET | Freq: Every day | ORAL | Status: DC

## 2015-04-30 MED ORDER — ATORVASTATIN CALCIUM 40 MG PO TABS
40.0000 mg | ORAL_TABLET | Freq: Every evening | ORAL | Status: DC
  Administered 2015-04-30 – 2015-05-01 (×2): 40 mg via ORAL
  Filled 2015-04-30 (×3): qty 1

## 2015-04-30 MED ORDER — PHENOBARBITAL 32.4 MG PO TABS
129.6000 mg | ORAL_TABLET | Freq: Every day | ORAL | Status: DC
  Administered 2015-04-30 – 2015-05-01 (×2): 129.6 mg via ORAL
  Filled 2015-04-30 (×2): qty 4

## 2015-04-30 NOTE — ED Provider Notes (Signed)
CSN: [id number]     Arrival date & time 04/30/15  1313 History   First MD Initiated Contact with Patient 04/30/15 1340     Chief Complaint  Patient presents with  . Hypotension     (Consider location/radiation/quality/duration/timing/severity/associated sxs/prior Treatment) HPI   64 year old male presents with multiple episodes of diarrhea that started this morning. He thinks he's had about 10 episodes of loose watery stool.  No vomiting or nausea. No abdominal pain. The patient started to feel weak and so EMS was called and they noticed his blood pressure to be in the 03O systolic. He feels better after being given 500 mL IV fluids by EMS. The patient is chronically on Lasix for leg swelling. The patient denies any shortness of breath, chest pain, or fevers. He never has felt lightheaded or dizzy. Does feel like his mouth is dry.  Past Medical History  Diagnosis Date  . Hypertension   . High cholesterol   . Hypothyroidism   . Seizure disorder   . Sleep apnea, obstructive     uses cpap machine .9  . Sleep apnea, obstructive   . Kidney stone     hx of  . Cancer     skin ca to scalp, sees Dr. Tonia Brooms  . Arthritis     left knee  . Cataract     bilateral  . Rash, skin     to left lower leg   . Asthma     "outgrew it from place I used to work at"  . Seizures 1966    "started as gran mal; lessened to no loss of consciousness nowl last ~ 2000" patient sees Dr. Floyde Parkins  . Peripheral edema   . Gait disorder   . Memory loss   . Cervical myelopathy   . Cervical spondylosis with myelopathy 08/17/2013  . Abnormality of gait 08/17/2013   Past Surgical History  Procedure Laterality Date  . Cervical discectomy  03/2011  . Eye surgery  [phone]    "right eye; 4 times total; for detached retina"  . Cataract extraction w/ intraocular lens  implant, bilateral  1990's  . Retinal detachment surgery      x 4 right eye  . Anterior cervical decomp/discectomy fusion  02/21/2012    Procedure:  ANTERIOR CERVICAL DECOMPRESSION/DISCECTOMY FUSION 1 LEVEL;  Surgeon: Hosie Spangle, MD;  Location: [place];  Service: Neurosurgery;  Laterality: N/A;  Cervical Five-Six anterior cervical decompression with fusion plating and bonegraft   Family History  Problem Relation Age of Onset  . Hypertension Mother   . Heart disease Mother   . Hypertension Father   . Glaucoma Father   . Heart disease Father    History  Substance Use Topics  . Smoking status: Never Smoker   . Smokeless tobacco: Never Used  . Alcohol Use: Yes     Comment: 11/18/11 "been awhile since I've had anything to drink"    Review of Systems  Constitutional: Positive for fatigue. Negative for fever.  Respiratory: Negative for shortness of breath.   Cardiovascular: Negative for chest pain.  Gastrointestinal: Positive for diarrhea. Negative for nausea, vomiting, abdominal pain and blood in stool.  Neurological: Positive for weakness.  All other systems reviewed and are negative.     Allergies  Depakote and Latex  Home Medications   Prior to Admission medications   Medication Sig Start Date End Date Taking? Authorizing Provider  aspirin EC 81 MG tablet Take 81 mg by mouth daily.  Historical Provider, MD  atorvastatin (LIPITOR) 40 MG tablet Take 40 mg by mouth every evening.      Historical Provider, MD  benazepril (LOTENSIN) 20 MG tablet Take 20 mg by mouth every evening.     Historical Provider, MD  hydrochlorothiazide (HYDRODIURIL) 25 MG tablet Take 25 mg by mouth daily.    Historical Provider, MD  ketoconazole (NIZORAL) 2 % shampoo Apply 1 application topically 2 (two) times a week.    Historical Provider, MD  levETIRAcetam (KEPPRA) 750 MG tablet TAKE 1 TABLET (750 MG TOTAL) BY MOUTH 2 (TWO) TIMES DAILY. 01/20/15   Kathrynn Ducking, MD  levothyroxine (SYNTHROID, LEVOTHROID) 150 MCG tablet Take 150 mcg by mouth daily before breakfast.    Historical Provider, MD  metoprolol tartrate (LOPRESSOR) 25 MG  tablet Take 25 mg by mouth 2 (two) times daily.      Historical Provider, MD  Multiple Vitamin (MULITIVITAMIN WITH MINERALS) TABS Take 1 tablet by mouth daily.      Historical Provider, MD  PHENobarbital (LUMINAL) 32.4 MG tablet TAKE 1 TABLET BY MOUTH EVERY DAY 04/22/15   Kathrynn Ducking, MD  PHENobarbital (LUMINAL) 64.8 MG tablet TAKE 2 TABLETS BY MOUTH AT BEDTIME 04/22/15   Kathrynn Ducking, MD  salicylic acid 6 % gel Apply 1 application topically daily.    Historical Provider, MD  ZOSTAVAX 93810 UNT/0.65ML injection Inject 0.65 mLs into the skin. 06/13/13   Historical Provider, MD   BP 96/58 mmHg  Pulse 87  Temp(Src) 98.8 F (37.1 C) (Oral)  Resp 17  Ht 6\' 1"  (1.854 m)  Wt 315 lb (142.883 kg)  BMI 41.57 kg/m2  SpO2 94% Physical Exam  Constitutional: He is oriented to person, place, and time. He appears well-developed and well-nourished.  HENT:  Head: Normocephalic and atraumatic.  Right Ear: External ear normal.  Left Ear: External ear normal.  Nose: Nose normal.  Mouth/Throat: Mucous membranes are dry.  Eyes: Right eye exhibits no discharge. Left eye exhibits no discharge.  Neck: Neck supple.  Cardiovascular: Normal rate, regular rhythm, normal heart sounds and intact distal pulses.   Pulmonary/Chest: Effort normal and breath sounds normal.  Abdominal: Soft. He exhibits no distension. There is no tenderness.  Musculoskeletal: He exhibits edema ( bilateral lower extremity edema).  Neurological: He is alert and oriented to person, place, and time.  Skin: Skin is warm and dry.  Nursing note and vitals reviewed.   ED Course  Procedures (including critical care time) Labs Review Labs Reviewed  BASIC METABOLIC PANEL - Abnormal; Notable for the following:    CO2 19 (*)    Glucose, Bld 117 (*)    Creatinine, Ser 1.97 (*)    GFR calc non Af Amer 35 (*)    GFR calc Af Amer 40 (*)    All other components within normal limits  CBC WITH DIFFERENTIAL/PLATELET - Abnormal; Notable for  the following:    WBC 11.7 (*)    Neutrophils Relative % 79 (*)    Neutro Abs 9.2 (*)    Lymphocytes Relative 11 (*)    Monocytes Absolute 1.1 (*)    All other components within normal limits  CLOSTRIDIUM DIFFICILE BY PCR (NOT AT [facility])  STOOL CULTURE  OVA AND PARASITE EXAMINATION  URINE CULTURE  CBC  CREATININE, SERUM  TROPONIN I  TROPONIN I  TROPONIN I  TSH  GI PATHOGEN PANEL BY PCR, STOOL  MAGNESIUM  I-STAT CG4 LACTIC ACID, ED    Imaging Review No results  found.   EKG Interpretation   Date/Time:  Wednesday April 30 2015 13:22:43 EDT Ventricular Rate:  89 PR Interval:  164 QRS Duration: 102 QT Interval:  357 QTC Calculation: 434 R Axis:   32 Text Interpretation:  Sinus rhythm Abnormal T, consider ischemia, diffuse  leads T wave changes are new compared to 2013 Confirmed by Arisha Gervais  MD,  [place] ([id number]) on 04/30/2015 1:41:01 PM      MDM   Final diagnoses:  Acute renal failure, unspecified acute renal failure type  Diarrhea    Patient did drop his blood pressure into the high 80s again, resuscitated with IV fluids. Normal lactate but his creatinine has increased from baseline.  I believe his hypotension is hypovolemia from diarrhea. We'll give IV fluids and will need monitoring and further resuscitation in the hospital. Is with the hospitalist will admit.    Sherwood Gambler, MD 04/30/15 [phone]

## 2015-04-30 NOTE — ED Notes (Signed)
PT denies fevers, recent hospitalizations, belly pain.

## 2015-04-30 NOTE — ED Notes (Signed)
Pt states he had 4 episodes of diarrhea this am that started around 6am. Pt started to feel very weak and mouth was dry. On ems arrival pts bp 80/60. Ems gave 500 cc NS. On arrival to ED pts bp 115/63 denies any pain.

## 2015-04-30 NOTE — ED Notes (Signed)
Pt assisted to [facility], had moderate amount liquid green/yellow stool,

## 2015-04-30 NOTE — H&P (Addendum)
[facility] History and Physical  [name] IBB:[id number] DOB: Mar 31, 1951 DOA: 04/30/2015  Referring physician:   PCP:  Melinda Crutch, MD   Chief Complaint:  Diarrhea   HPI:  64 year old male with a history of seizure disorder, cervical myelopathy with gait disorder, hypertension on 3 different medications, sleep apnea, presents today with diarrhea but no nausea or vomiting. The patient denies any abdominal pain. He was found to be hypotensive with systolic blood pressure in the 80s upon presentation. He denies any hematochezia or melena. According to the patient his had about 10 episodes of diarrhea since yesterday. Complains of weakness. He has not received any recent course of antibodies.      Review of Systems: negative for the following  Constitutional: Positive for fatigue. Negative for fever.  Respiratory: Negative for shortness of breath.  Cardiovascular: Negative for chest pain.  Gastrointestinal: Positive for diarrhea. Negative for nausea, vomiting, abdominal pain and blood in stool.  Neurological: Positive for weakness Remaining 12 point review of systems was found to be negative, other than pertinent positives mentioned above    Past Medical History  Diagnosis Date  . Hypertension   . High cholesterol   . Hypothyroidism   . Seizure disorder   . Sleep apnea, obstructive     uses cpap machine .9  . Sleep apnea, obstructive   . Kidney stone     hx of  . Cancer     skin ca to scalp, sees Dr. Tonia Brooms  . Arthritis     left knee  . Cataract     bilateral  . Rash, skin     to left lower leg   . Asthma     "outgrew it from place I used to work at"  . Seizures 1966    "started as gran mal; lessened to no loss of consciousness nowl last ~ 2000" patient sees Dr. Floyde Parkins  . Peripheral edema   . Gait disorder   . Memory loss   . Cervical myelopathy   . Cervical spondylosis with myelopathy 08/17/2013  . Abnormality of gait 08/17/2013     Past  Surgical History  Procedure Laterality Date  . Cervical discectomy  03/2011  . Eye surgery  [phone]    "right eye; 4 times total; for detached retina"  . Cataract extraction w/ intraocular lens  implant, bilateral  1990's  . Retinal detachment surgery      x 4 right eye  . Anterior cervical decomp/discectomy fusion  02/21/2012    Procedure: ANTERIOR CERVICAL DECOMPRESSION/DISCECTOMY FUSION 1 LEVEL;  Surgeon: Hosie Spangle, MD;  Location: [place] [facility];  Service: Neurosurgery;  Laterality: N/A;  Cervical Five-Six anterior cervical decompression with fusion plating and bonegraft      Social History:  reports that he has never smoked. He has never used smokeless tobacco. He reports that he drinks alcohol. He reports that he does not use illicit drugs.   Allergies  Allergen Reactions  . Depakote [Divalproex Sodium]     Intolerant  . Latex Rash    Hands rash and  mild hives    Family History  Problem Relation Age of Onset  . Hypertension Mother   . Heart disease Mother   . Hypertension Father   . Glaucoma Father   . Heart disease Father      Prior to Admission medications   Medication Sig Start Date End Date Taking? Authorizing Provider  aspirin EC 81 MG tablet Take 81 mg by mouth daily.  Yes Historical Provider, MD  atorvastatin (LIPITOR) 40 MG tablet Take 40 mg by mouth every evening.     Yes Historical Provider, MD  benazepril (LOTENSIN) 20 MG tablet Take 20 mg by mouth every evening.    Yes Historical Provider, MD  hydrochlorothiazide (HYDRODIURIL) 25 MG tablet Take 25 mg by mouth daily.   Yes Historical Provider, MD  levETIRAcetam (KEPPRA) 750 MG tablet TAKE 1 TABLET (750 MG TOTAL) BY MOUTH 2 (TWO) TIMES DAILY. 01/20/15  Yes Kathrynn Ducking, MD  levothyroxine (SYNTHROID, LEVOTHROID) 150 MCG tablet Take 300-450 mcg by mouth daily before breakfast. Pt takes 450mg  on MWF - pt takes 300mg  all other days   Yes Historical Provider, MD  metoprolol tartrate (LOPRESSOR) 25 MG  tablet Take 25 mg by mouth 2 (two) times daily.     Yes Historical Provider, MD  Multiple Vitamin (MULITIVITAMIN WITH MINERALS) TABS Take 1 tablet by mouth daily.     Yes Historical Provider, MD  PHENobarbital (LUMINAL) 32.4 MG tablet TAKE 1 TABLET BY MOUTH EVERY DAY 04/22/15  Yes Kathrynn Ducking, MD  PHENobarbital (LUMINAL) 64.8 MG tablet TAKE 2 TABLETS BY MOUTH AT BEDTIME 04/22/15  Yes Kathrynn Ducking, MD     Physical Exam: Filed Vitals:   04/30/15 1430 04/30/15 1437 04/30/15 1445 04/30/15 1500  BP: 100/56 100/62 93/62 106/61  Pulse: 79 84 77 79  Temp:      TempSrc:      Resp: 14 15 14 17   Height:      Weight:      SpO2: 95% 94% 95% 96%     Constitutional: Vital signs reviewed.Patient appears to be stable with no cardiopulmonary distress Normocephalic and atraumatic  Ear: TM normal bilaterally  Mouth: no erythema or exudates, MMM  Eyes: PERRL, EOMI, conjunctivae normal, No scleral icterus.  Neck: Supple, Trachea midline normal ROM, No JVD, mass, thyromegaly, or carotid bruit present.  Cardiovascular: RRR, S1 normal, S2 normal, no MRG, pulses symmetric and intact bilaterally  Pulmonary/Chest: CTAB, no wheezes, rales, or rhonchi  Abdominal: Soft. Non-tender, non-distended, bowel sounds are normal, no masses, organomegaly, or guarding present.  GU: no CVA tenderness Musculoskeletal:He exhibits edema ( bilateral lower extremity edema).  Ext: no edema and no cyanosis, pulses palpable bilaterally (DP and PT)  Hematology: no cervical, inginal, or axillary adenopathy.  Neurological: A&O x3, Strenght is normal and symmetric bilaterally, cranial nerve II-XII are grossly intact, no focal motor deficit, sensory intact to light touch bilaterally.  Skin: Warm, dry and intact. No rash, cyanosis, or clubbing.  Psychiatric: Normal mood and affect. speech and behavior is normal. Judgment and thought content normal. Cognition and memory are normal.       Labs on Admission:    No results  found for this or any previous visit (from the past 240 hour(s)).      Basic Metabolic Panel:  Recent Labs Lab 04/30/15 1408  NA 135  K 4.3  CL 107  CO2 19*  GLUCOSE 117*  BUN 18  CREATININE 1.97*  CALCIUM 9.1     Liver Function Tests: No results for input(s): AST, ALT, ALKPHOS, BILITOT, PROT, ALBUMIN in the last 168 hours. No results for input(s): LIPASE, AMYLASE in the last 168 hours. No results for input(s): AMMONIA in the last 168 hours.   CBC:  Recent Labs Lab 04/30/15 1408  WBC 11.7*  NEUTROABS 9.2*  HGB 13.0  HCT 39.2  MCV 92.0  PLT 272      Cardiac Enzymes: No  results for input(s): CKTOTAL, CKMB, CKMBINDEX, TROPONINI in the last 168 hours.  BNP (last 3 results) No results for input(s): BNP in the last 8760 hours.  ProBNP (last 3 results) No results for input(s): PROBNP in the last 8760 hours.     CBG: No results for input(s): GLUCAP in the last 168 hours.  Radiological Exams on Admission: No results found.   EKG: Independently reviewed. Assessment/Plan Active Problems:   Acute renal failure   Diarrhea  Diarrhea Likely viral gastroenteritis, patient will be ruled out for C. difficile, will send off stool studies including a GI pathogen panel   Hypertension hold all antihypertensives medications given acute on chronic renal failure diarrhea  Acute on chronic renal failure, patient's baseline creatinine is around 1.2, hold Lasix and ACE inhibitor(, aggressive IV hydration  Seizure disorder, continue outpatient seizure medications, his seizures are well-controlled  History of cervical myelopathy and gait disorder, no recent history of falls, [facility] by [facility]     Code Status:   full Family Communication: bedside Disposition Plan: admit   Time spent: 70 mins   [facility] Pager [phone]  If 7PM-7AM, please contact night-coverage [email or web address] Password [facility] 04/30/2015, 3:32  PM

## 2015-05-01 DIAGNOSIS — R197 Diarrhea, unspecified: Secondary | ICD-10-CM

## 2015-05-01 DIAGNOSIS — I10 Essential (primary) hypertension: Secondary | ICD-10-CM

## 2015-05-01 DIAGNOSIS — D649 Anemia, unspecified: Secondary | ICD-10-CM

## 2015-05-01 DIAGNOSIS — N179 Acute kidney failure, unspecified: Secondary | ICD-10-CM

## 2015-05-01 LAB — CBC
HEMATOCRIT: 35.3 % — AB (ref 39.0–52.0)
Hemoglobin: 11.8 g/dL — ABNORMAL LOW (ref 13.0–17.0)
MCH: 30.7 pg (ref 26.0–34.0)
MCHC: 33.4 g/dL (ref 30.0–36.0)
MCV: 91.9 fL (ref 78.0–100.0)
PLATELETS: 250 10*3/uL (ref 150–400)
RBC: 3.84 MIL/uL — ABNORMAL LOW (ref 4.22–5.81)
RDW: 12.8 % (ref 11.5–15.5)
WBC: 7.3 10*3/uL (ref 4.0–10.5)

## 2015-05-01 LAB — COMPREHENSIVE METABOLIC PANEL
ALBUMIN: 3.5 g/dL (ref 3.5–5.0)
ALK PHOS: 145 U/L — AB (ref 38–126)
ALT: 20 U/L (ref 17–63)
AST: 21 U/L (ref 15–41)
Anion gap: 9 (ref 5–15)
BILIRUBIN TOTAL: 0.4 mg/dL (ref 0.3–1.2)
BUN: 12 mg/dL (ref 6–20)
CALCIUM: 8.9 mg/dL (ref 8.9–10.3)
CHLORIDE: 103 mmol/L (ref 101–111)
CO2: 19 mmol/L — ABNORMAL LOW (ref 22–32)
Creatinine, Ser: 1.08 mg/dL (ref 0.61–1.24)
GFR calc non Af Amer: >60 mL/min (ref >60)
Glucose, Bld: 78 mg/dL (ref 65–99)
Potassium: 4 mmol/L (ref 3.5–5.1)
Sodium: 131 mmol/L — ABNORMAL LOW (ref 135–145)
TOTAL PROTEIN: 7.2 g/dL (ref 6.5–8.1)

## 2015-05-01 LAB — BASIC METABOLIC PANEL
Anion gap: 8 (ref 5–15)
BUN: 16 mg/dL (ref 6–20)
CO2: 20 mmol/L — AB (ref 22–32)
CREATININE: 1.24 mg/dL (ref 0.61–1.24)
Calcium: 8.9 mg/dL (ref 8.9–10.3)
Chloride: 107 mmol/L (ref 101–111)
GLUCOSE: 94 mg/dL (ref 65–99)
Potassium: 4.2 mmol/L (ref 3.5–5.1)
Sodium: 135 mmol/L (ref 135–145)

## 2015-05-01 LAB — OVA AND PARASITE EXAMINATION: Ova and parasites: NONE SEEN

## 2015-05-01 LAB — TROPONIN I: Troponin I: <0.03 ng/mL (ref <0.031)

## 2015-05-01 MED ORDER — SODIUM CHLORIDE 0.9 % IV SOLN
INTRAVENOUS | Status: AC
  Administered 2015-05-01: 11:00:00 via INTRAVENOUS

## 2015-05-01 NOTE — Progress Notes (Signed)
[facility] PROGRESS NOTE  Assessment/Plan: Acute  Diarrhea: - C. Dif negative, with ongoing diarhea. - Stool and ova pending. - no bloody stools.  Normocytic anemia - follow up with PCP as and outpatient  Acute renal failure: - Cont IV hydration, recheck a b-met in am. - Most likely pre-renal.  HTN (hypertension), benign - > 120/80, when Cr at baseline and BP > 140/90 can restart Bp medications.    Code Status: full Family Communication: none  Disposition Plan: inpatient   Consultants:  none  Procedures:  none  Antibiotics:  None  HPI/Subjective: Cont to have watery stools.  Objective: Filed Vitals:   04/30/15 1900 04/30/15 1952 05/01/15 0553 05/01/15 0908  BP: 115/58 121/67 120/57 143/80  Pulse: 75 73 85 89  Temp:  97.7 F (36.5 C) 97.7 F (36.5 C) 96.6 F (35.9 C)  TempSrc:  Oral Oral Oral  Resp: _0 Height:  6' (1.829 m)    Weight:  127.279 kg (280 lb 9.6 oz)    SpO2: 100% 98% 95% 98%    Intake/Output Summary (Last 24 hours) at 05/01/15 0950 Last data filed at 05/01/15 0929  Gross per 24 hour  Intake   3360 ml  Output    300 ml  Net   3060 ml   Filed Weights   04/30/15 1326 04/30/15 1952  Weight: 142.883 kg (315 lb) 127.279 kg (280 lb 9.6 oz)    Exam:  General: Alert, awake, oriented x3, in no acute distress.  HEENT: No bruits, no goiter.  Heart: Regular rate and rhythm. Lungs: Good air movement, clear.  Abdomen: Soft, nontender, nondistended, positive bowel sounds.  Neuro: Grossly intact, nonfocal.   Data Reviewed: Basic Metabolic Panel:  Recent Labs Lab 04/30/15 1408 04/30/15 1614  NA 135  --   K 4.3  --   CL 107  --   CO2 19*  --   GLUCOSE 117*  --   BUN 18  --   CREATININE 1.97* 2.02*  CALCIUM 9.1  --   MG  --  1.8   Liver Function Tests: No results for input(s): AST, ALT, ALKPHOS, BILITOT, PROT, ALBUMIN in the last 168 hours. No results for input(s): LIPASE, AMYLASE in the last 168 hours. No  results for input(s): AMMONIA in the last 168 hours. CBC:  Recent Labs Lab 04/30/15 1408 04/30/15 1614 05/01/15 0541  WBC 11.7* 10.3 7.3  NEUTROABS 9.2*  --   --   HGB 13.0 13.3 11.8*  HCT 39.2 39.5 35.3*  MCV 92.0 91.9 91.9  PLT 272 276 250   Cardiac Enzymes:  Recent Labs Lab 04/30/15 1614 04/30/15 2222 05/01/15 0510  TROPONINI <0.03 <0.03 <0.03   BNP (last 3 results) No results for input(s): BNP in the last 8760 hours.  ProBNP (last 3 results) No results for input(s): PROBNP in the last 8760 hours.  CBG: No results for input(s): GLUCAP in the last 168 hours.  Recent Results (from the past 240 hour(s))  Clostridium Difficile by PCR (not at [facility])     Status: None   Collection Time: 04/30/15  4:00 PM  Result Value Ref Range Status   C difficile by pcr NEGATIVE NEGATIVE Final     Studies: No results found.  Scheduled Meds: . sodium chloride   Intravenous STAT  . aspirin EC  81 mg Oral Daily  . atorvastatin  40 mg Oral QPM  . heparin  5,000 Units Subcutaneous 3 times per day  .  levETIRAcetam  750 mg Oral BID  . levothyroxine  300 mcg Oral Q T,Th,S,Su  . [START ON 05/02/2015] levothyroxine  450 mcg Oral Q M,W,F  . PHENobarbital  129.6 mg Oral QHS  . PHENobarbital  32.4 mg Oral Daily  . sodium chloride  3 mL Intravenous Q12H   Continuous Infusions: . lactated ringers      Time Spent: 25 min   Charlynne Cousins  [facility] Pager [phone]. If 7PM-7AM, please contact night-coverage at [email or web address], password [facility] 05/01/2015, 9:50 AM  LOS: 1 day

## 2015-05-02 DIAGNOSIS — I10 Essential (primary) hypertension: Secondary | ICD-10-CM | POA: Diagnosis not present

## 2015-05-02 DIAGNOSIS — D649 Anemia, unspecified: Secondary | ICD-10-CM | POA: Diagnosis not present

## 2015-05-02 DIAGNOSIS — N179 Acute kidney failure, unspecified: Secondary | ICD-10-CM | POA: Diagnosis not present

## 2015-05-02 DIAGNOSIS — R197 Diarrhea, unspecified: Secondary | ICD-10-CM | POA: Diagnosis not present

## 2015-05-02 LAB — BASIC METABOLIC PANEL
Anion gap: 7 (ref 5–15)
BUN: 9 mg/dL (ref 6–20)
CHLORIDE: 105 mmol/L (ref 101–111)
CO2: 20 mmol/L — AB (ref 22–32)
Calcium: 8.4 mg/dL — ABNORMAL LOW (ref 8.9–10.3)
Creatinine, Ser: 0.8 mg/dL (ref 0.61–1.24)
GFR calc Af Amer: >60 mL/min (ref >60)
GLUCOSE: 81 mg/dL (ref 65–99)
Potassium: 4.1 mmol/L (ref 3.5–5.1)
SODIUM: 132 mmol/L — AB (ref 135–145)

## 2015-05-02 MED ORDER — BENAZEPRIL HCL 20 MG PO TABS
20.0000 mg | ORAL_TABLET | Freq: Every evening | ORAL | Status: DC
Start: 2015-05-05 — End: 2016-01-12

## 2015-05-02 MED ORDER — HYDROCHLOROTHIAZIDE 25 MG PO TABS: 25.0000 mg | ORAL_TABLET | Freq: Every day | ORAL | Status: DC

## 2015-05-02 NOTE — Progress Notes (Signed)
Utilization review completed.  

## 2015-05-02 NOTE — Discharge Summary (Signed)
Physician Discharge Summary  [name] ZSW:[id number] DOB: 1950/12/05 DOA: 04/30/2015  PCP:  Melinda Crutch, MD  Admit date: 04/30/2015 Discharge date: 05/02/2015  Time spent: 35 minutes  Recommendations for Outpatient Follow-up:  1. Follow up with PCP in 2 weeks.  Discharge Diagnoses:  Active Problems:   HTN (hypertension), benign   Acute renal failure   Diarrhea   Normocytic anemia   Discharge Condition: stable  Diet recommendation: regular  Filed Weights   04/30/15 1326 04/30/15 1952 05/01/15 2302  Weight: 142.883 kg (315 lb) 127.279 kg (280 lb 9.6 oz) 133.04 kg (293 lb 4.8 oz)    History of present illness:  64 year old male with a history of seizure disorder, cervical myelopathy with gait disorder, hypertension on 3 different medications, sleep apnea, presents today with diarrhea but no nausea or vomiting. The patient denies any abdominal pain. He was found to be hypotensive with systolic blood pressure in the 80s upon presentation. He denies any hematochezia or melena. According to the patient his had about 10 episodes of diarrhea since yesterday. Complains of weakness. He has not received any recent course of antibodies.  Hospital Course:  Acute Diarrhea likely due to viral gastroenteritis: - treated conservativelyC. Dif negative,  Stool and ova negative till date. - no bloody stools. diarrhea improved and was able to tolerate diet.  Normocytic anemia - follow up with PCP as and outpatient  Acute renal failure: - likely pre-renal, resolved with IV hydration.   HTN (hypertension), benign - Bp low admisison antihypertensive medication held,. - BP started to improved with IV hydration to 120/80 - Antihypertensive medication can restart Bp as an outpatient  Procedures:  none  Consultations:  none  Discharge Exam: Filed Vitals:   05/02/15 0851  BP: 117/77  Pulse: 91  Temp: 98.2 F (36.8 C)  Resp: 16    General: A&O x3 Cardiovascular:  RRR Respiratory: good air movement CTA B/L  Discharge Instructions   Discharge Instructions    Diet - low sodium heart healthy    Complete by:  As directed      Increase activity slowly    Complete by:  As directed           Discharge Medication List as of 05/02/2015  3:39 PM    CONTINUE these medications which have CHANGED   Details  benazepril (LOTENSIN) 20 MG tablet Take 1 tablet (20 mg total) by mouth every evening., Starting 05/05/2015, Until Discontinued, No Print    hydrochlorothiazide (HYDRODIURIL) 25 MG tablet Take 1 tablet (25 mg total) by mouth daily., Starting 05/05/2015, Until Discontinued, No Print      CONTINUE these medications which have NOT CHANGED   Details  aspirin EC 81 MG tablet Take 81 mg by mouth daily.  , Until Discontinued, Historical Med    atorvastatin (LIPITOR) 40 MG tablet Take 40 mg by mouth every evening.  , Until Discontinued, Historical Med    levETIRAcetam (KEPPRA) 750 MG tablet TAKE 1 TABLET (750 MG TOTAL) BY MOUTH 2 (TWO) TIMES DAILY., Normal    levothyroxine (SYNTHROID, LEVOTHROID) 150 MCG tablet Take 300-450 mcg by mouth daily before breakfast. Pt takes 450mg  on MWF - pt takes 300mg  all other days, Until Discontinued, Historical Med    metoprolol tartrate (LOPRESSOR) 25 MG tablet Take 25 mg by mouth 2 (two) times daily.  , Until Discontinued, Historical Med    Multiple Vitamin (MULITIVITAMIN WITH MINERALS) TABS Take 1 tablet by mouth daily.  , Until Discontinued, Historical Med    !!  PHENobarbital (LUMINAL) 32.4 MG tablet TAKE 1 TABLET BY MOUTH EVERY DAY, Print    !! PHENobarbital (LUMINAL) 64.8 MG tablet TAKE 2 TABLETS BY MOUTH AT BEDTIME, Print     !! - Potential duplicate medications found. Please discuss with provider.     Allergies  Allergen Reactions  . Depakote [Divalproex Sodium]     Intolerant  . Latex Rash    Hands rash and  mild hives   Follow-up Information    Follow up with  Melinda Crutch, MD In 3 weeks.   Specialty:   Family Medicine   Why:  hospital follow up   Contact information:   [place] [address] [phone]        The results of significant diagnostics from this hospitalization (including imaging, microbiology, ancillary and laboratory) are listed below for reference.    Significant Diagnostic Studies: No results found.  Microbiology: Recent Results (from the past 240 hour(s))  Clostridium Difficile by PCR (not at [facility])     Status: None   Collection Time: 04/30/15  4:00 PM  Result Value Ref Range Status   C difficile by pcr NEGATIVE NEGATIVE Final  Stool culture     Status: None (Preliminary result)   Collection Time: 04/30/15  4:00 PM  Result Value Ref Range Status   Specimen Description STOOL  Final   Special Requests NONE  Final   Culture   Final    NO SUSPICIOUS COLONIES, CONTINUING TO HOLD Performed at [facility]    Report Status PENDING  Incomplete  Ova and parasite examination     Status: None   Collection Time: 04/30/15  4:00 PM  Result Value Ref Range Status   Specimen Description STOOL  Final   Special Requests NONE  Final   Ova and parasites   Final    NO OVA OR PARASITES SEEN Performed at [facility]    Report Status 05/01/2015 FINAL  Final     Labs: Basic Metabolic Panel:  Recent Labs Lab 04/30/15 1408 04/30/15 1614 05/01/15 1144 05/01/15 1621 05/02/15 0418  NA 135  --  135 131* 132*  K 4.3  --  4.2 4.0 4.1  CL 107  --  107 103 105  CO2 19*  --  20* 19* 20*  GLUCOSE 117*  --  94 78 81  BUN 18  --  16 12 9   CREATININE 1.97* 2.02* 1.24 1.08 0.80  CALCIUM 9.1  --  8.9 8.9 8.4*  MG  --  1.8  --   --   --    Liver Function Tests:  Recent Labs Lab 05/01/15 1621  AST 21  ALT 20  ALKPHOS 145*  BILITOT 0.4  PROT 7.2  ALBUMIN 3.5   No results for input(s): LIPASE, AMYLASE in the last 168 hours. No results for input(s): AMMONIA in the last 168 hours. CBC:  Recent Labs Lab 04/30/15 1408  04/30/15 1614 05/01/15 0541  WBC 11.7* 10.3 7.3  NEUTROABS 9.2*  --   --   HGB 13.0 13.3 11.8*  HCT 39.2 39.5 35.3*  MCV 92.0 91.9 91.9  PLT 272 276 250   Cardiac Enzymes:  Recent Labs Lab 04/30/15 1614 04/30/15 2222 05/01/15 0510  TROPONINI <0.03 <0.03 <0.03   BNP: BNP (last 3 results) No results for input(s): BNP in the last 8760 hours.  ProBNP (last 3 results) No results for input(s): PROBNP in the last 8760 hours.  CBG: No results for input(s): GLUCAP  in the last 168 hours.     Signed:  Charlynne Cousins  [facility] 05/02/2015, 4:28 PM

## 2015-05-02 NOTE — Care Management (Signed)
The admission has been reviewed and determined not to meet inpatient level of care. Both attending physician and Medical Directory are in agreement this should be an observation encounter according to the Medicare Conditions of Participation as set forth in CFR 42 Chapter 456 482.12 (c) and the Medi are Condition Code-44 Regulations CFR 42 Chapter 100 - 04 50.3.  The Patient was notified via delivery of the "Anthony.

## 2015-05-03 LAB — URINE CULTURE

## 2015-05-03 LAB — GI PATHOGEN PANEL BY PCR, STOOL
C difficile toxin A/B: NOT DETECTED
CRYPTOSPORIDIUM BY PCR: NOT DETECTED
Campylobacter by PCR: NOT DETECTED
E coli (ETEC) LT/ST: NOT DETECTED
E coli (STEC): NOT DETECTED
E coli 0157 by PCR: NOT DETECTED
G LAMBLIA BY PCR: NOT DETECTED
Norovirus GI/GII: NOT DETECTED
ROTAVIRUS A BY PCR: NOT DETECTED
SALMONELLA BY PCR: NOT DETECTED
SHIGELLA BY PCR: NOT DETECTED

## 2015-05-03 LAB — STOOL CULTURE

## 2015-05-17 ENCOUNTER — Emergency Department (HOSPITAL_COMMUNITY)
Admission: EM | Admit: 2015-05-17 | Discharge: 2015-05-17 | Disposition: A | Discharge status: Home / Self Care | Payer: Medicare Other | Attending: Emergency Medicine | Admitting: Emergency Medicine

## 2015-05-17 ENCOUNTER — Encounter (HOSPITAL_COMMUNITY): Payer: Self-pay

## 2015-05-17 DIAGNOSIS — M1990 Unspecified osteoarthritis, unspecified site: Secondary | ICD-10-CM | POA: Diagnosis not present

## 2015-05-17 DIAGNOSIS — I10 Essential (primary) hypertension: Secondary | ICD-10-CM | POA: Insufficient documentation

## 2015-05-17 DIAGNOSIS — G40909 Epilepsy, unspecified, not intractable, without status epilepticus: Secondary | ICD-10-CM | POA: Insufficient documentation

## 2015-05-17 DIAGNOSIS — Z9981 Dependence on supplemental oxygen: Secondary | ICD-10-CM | POA: Insufficient documentation

## 2015-05-17 DIAGNOSIS — J45909 Unspecified asthma, uncomplicated: Secondary | ICD-10-CM | POA: Insufficient documentation

## 2015-05-17 DIAGNOSIS — Z85828 Personal history of other malignant neoplasm of skin: Secondary | ICD-10-CM | POA: Insufficient documentation

## 2015-05-17 DIAGNOSIS — Z91040 Latex allergy status: Secondary | ICD-10-CM | POA: Diagnosis not present

## 2015-05-17 DIAGNOSIS — Z7982 Long term (current) use of aspirin: Secondary | ICD-10-CM | POA: Diagnosis not present

## 2015-05-17 DIAGNOSIS — I951 Orthostatic hypotension: Secondary | ICD-10-CM | POA: Diagnosis not present

## 2015-05-17 DIAGNOSIS — E039 Hypothyroidism, unspecified: Secondary | ICD-10-CM | POA: Insufficient documentation

## 2015-05-17 DIAGNOSIS — E785 Hyperlipidemia, unspecified: Secondary | ICD-10-CM | POA: Insufficient documentation

## 2015-05-17 DIAGNOSIS — Z79899 Other long term (current) drug therapy: Secondary | ICD-10-CM | POA: Insufficient documentation

## 2015-05-17 DIAGNOSIS — G4733 Obstructive sleep apnea (adult) (pediatric): Secondary | ICD-10-CM | POA: Diagnosis not present

## 2015-05-17 DIAGNOSIS — Z87442 Personal history of urinary calculi: Secondary | ICD-10-CM | POA: Diagnosis not present

## 2015-05-17 DIAGNOSIS — R42 Dizziness and giddiness: Secondary | ICD-10-CM

## 2015-05-17 LAB — CBC
HCT: 39.3 % (ref 39.0–52.0)
HEMOGLOBIN: 13.2 g/dL (ref 13.0–17.0)
MCH: 30.2 pg (ref 26.0–34.0)
MCHC: 33.6 g/dL (ref 30.0–36.0)
MCV: 89.9 fL (ref 78.0–100.0)
PLATELETS: 227 10*3/uL (ref 150–400)
RBC: 4.37 MIL/uL (ref 4.22–5.81)
RDW: 12.5 % (ref 11.5–15.5)
WBC: 8.5 10*3/uL (ref 4.0–10.5)

## 2015-05-17 LAB — BASIC METABOLIC PANEL
Anion gap: 10 (ref 5–15)
BUN: 13 mg/dL (ref 6–20)
CALCIUM: 9.3 mg/dL (ref 8.9–10.3)
CHLORIDE: 99 mmol/L — AB (ref 101–111)
CO2: 23 mmol/L (ref 22–32)
Creatinine, Ser: 1.03 mg/dL (ref 0.61–1.24)
GFR calc non Af Amer: >60 mL/min (ref >60)
Glucose, Bld: 78 mg/dL (ref 65–99)
POTASSIUM: 3.9 mmol/L (ref 3.5–5.1)
SODIUM: 132 mmol/L — AB (ref 135–145)

## 2015-05-17 LAB — I-STAT TROPONIN, ED: Troponin i, poc: 0.01 ng/mL (ref 0.00–0.08)

## 2015-05-17 MED ORDER — SODIUM CHLORIDE 0.9 % IV BOLUS (SEPSIS)
1000.0000 mL | Freq: Once | INTRAVENOUS | Status: AC
  Administered 2015-05-17: 1000 mL via INTRAVENOUS

## 2015-05-17 NOTE — ED Notes (Signed)
Pt had an episode of dizziness after eating and standing up afterwards, denies current dizziness, denies chest pain, denies SOB denies weakness.

## 2015-05-17 NOTE — ED Notes (Addendum)
For update call sister in law Wallene Huh --[phone]--[facility] pt is to be released

## 2015-05-17 NOTE — ED Notes (Signed)
Ambulated in the hall using cane.  Good steady gait noted.

## 2015-05-17 NOTE — Discharge Instructions (Signed)
Dizziness °Dizziness is a common problem. It is a feeling of unsteadiness or light-headedness. You may feel like you are about to faint. Dizziness can lead to injury if you stumble or fall. A person of any age group can suffer from dizziness, but dizziness is more common in older adults. °CAUSES  °Dizziness can be caused by many different things, including: °· Middle ear problems. °· Standing for too long. °· Infections. °· An allergic reaction. °· Aging. °· An emotional response to something, such as the sight of blood. °· Side effects of medicines. °· Tiredness. °· Problems with circulation or blood pressure. °· Excessive use of alcohol or medicines, or illegal drug use. °· Breathing too fast (hyperventilation). °· An irregular heart rhythm (arrhythmia). °· A low red blood cell count (anemia). °· Pregnancy. °· Vomiting, diarrhea, fever, or other illnesses that cause body fluid loss (dehydration). °· Diseases or conditions such as Parkinson's disease, high blood pressure (hypertension), diabetes, and thyroid problems. °· Exposure to extreme heat. °DIAGNOSIS  °Your health care provider will ask about your symptoms, perform a physical exam, and perform an electrocardiogram (ECG) to record the electrical activity of your heart. Your health care provider may also perform other heart or blood tests to determine the cause of your dizziness. These may include: °· Transthoracic echocardiogram (TTE). During echocardiography, sound waves are used to evaluate how blood flows through your heart. °· Transesophageal echocardiogram (TEE). °· Cardiac monitoring. This allows your health care provider to monitor your heart rate and rhythm in real time. °· Holter monitor. This is a portable device that records your heartbeat and can help diagnose heart arrhythmias. It allows your health care provider to track your heart activity for several days if needed. °· Stress tests by exercise or by giving medicine that makes the heart beat  faster. °TREATMENT  °Treatment of dizziness depends on the cause of your symptoms and can vary greatly. °HOME CARE INSTRUCTIONS  °· Drink enough fluids to keep your urine clear or pale yellow. This is especially important in very hot weather. In older adults, it is also important in cold weather. °· Take your medicine exactly as directed if your dizziness is caused by medicines. When taking blood pressure medicines, it is especially important to get up slowly. °¨ Rise slowly from chairs and steady yourself until you feel okay. °¨ In the morning, first sit up on the side of the bed. When you feel okay, stand slowly while holding onto something until you know your balance is fine. °· Move your legs often if you need to stand in one place for a long time. Tighten and relax your muscles in your legs while standing. °· Have someone stay with you for 1-2 days if dizziness continues to be a problem. Do this until you feel you are well enough to stay alone. Have the person call your health care provider if he or she notices changes in you that are concerning. °· Do not drive or use heavy machinery if you feel dizzy. °· Do not drink alcohol. °SEEK IMMEDIATE MEDICAL CARE IF:  °· Your dizziness or light-headedness gets worse. °· You feel nauseous or vomit. °· You have problems talking, walking, or using your arms, hands, or legs. °· You feel weak. °· You are not thinking clearly or you have trouble forming sentences. It may take a friend or family member to notice this. °· You have chest pain, abdominal pain, shortness of breath, or sweating. °· Your vision changes. °· You notice   any bleeding. °· You have side effects from medicine that seems to be getting worse rather than better. °MAKE SURE YOU:  °· Understand these instructions. °· Will watch your condition. °· Will get help right away if you are not doing well or get worse. °Document Released: 05/04/2001 Document Revised: 11/13/2013 Document Reviewed: 05/28/2011 °ExitCare®  Patient Information ©2015 [employer]. This information is not intended to replace advice given to you by your health care provider. Make sure you discuss any questions you have with your health care provider. °Orthostatic Hypotension °Orthostatic hypotension is a sudden drop in blood pressure. It happens when you quickly stand up from a seated or lying position. You may feel dizzy or light-headed. This can last for just a few seconds or for up to a few minutes. It is usually not a serious problem. However, if this happens frequently or gets worse, it can be a sign of something more serious. °CAUSES  °Different things can cause orthostatic hypotension, including:  °· Loss of body fluids (dehydration). °· Medicines that lower blood pressure. °· Sudden changes in posture, such as standing up quickly after you have been sitting or lying down. °· Taking too much of your medicine. °SIGNS AND SYMPTOMS  °· Light-headedness or dizziness.   °· Fainting or near-fainting.   °· A fast heart rate.   °· Weakness.   °· Feeling tired (fatigue).   °DIAGNOSIS  °Your health care provider may do several things to help diagnose your condition and identify the cause. These may include:  °· Taking a medical history and doing a physical exam. °· Checking your blood pressure. Your health care provider will check your blood pressure when you are: °¨ Lying down. °¨ Sitting. °¨ Standing. °· Using tilt table testing. In this test, you lie down on a table that moves from a lying position to a standing position. You will be strapped onto the table. This test monitors your blood pressure and heart rate when you are in different positions. °TREATMENT  °Treatment will vary depending on the cause. Possible treatments include:  °· Changing the dosage of your medicines.  °· Wearing compression stockings on your lower legs. °· Standing up slowly after sitting or lying down. °· Eating more salt. °· Eating frequent, small meals. °· In some cases,  getting IV fluids. °· Taking medicine to enhance fluid retention. °HOME CARE INSTRUCTIONS °· Only take over-the-counter or prescription medicines as directed by your health care provider. °¨ Follow your health care provider's instructions for changing the dosage of your current medicines. °¨ Do not stop or adjust your medicine on your own. °· Stand up slowly after sitting or lying down. This allows your body to adjust to the different position. °· Wear compression stockings as directed. °· Eat extra salt as directed. °¨ Do not add extra salt to your diet unless directed to by your health care provider. °· Eat frequent, small meals. °· Avoid standing suddenly after eating. °· Avoid hot showers or excessive heat as directed by your health care provider. °· Keep all follow-up appointments. °SEEK MEDICAL CARE IF: °· You continue to feel dizzy or light-headed after standing. °· You feel groggy or confused. °· You feel cold, clammy, or sick to your stomach (nauseous). °· You have blurred vision. °· You feel short of breath. °SEEK IMMEDIATE MEDICAL CARE IF:  °· You faint after standing. °· You have chest pain.  °· You have difficulty breathing.   °· You lose feeling or movement in your arms or legs.   °· You have slurred   speech or difficulty talking, or you are unable to talk.   °MAKE SURE YOU:  °· Understand these instructions. °· Will watch your condition. °· Will get help right away if you are not doing well or get worse. °Document Released: 10/29/2002 Document Revised: 11/13/2013 Document Reviewed: 08/31/2013 °ExitCare® Patient Information ©2015 [employer]. This information is not intended to replace advice given to you by your health care provider. Make sure you discuss any questions you have with your health care provider. ° °

## 2015-05-17 NOTE — ED Notes (Signed)
Pt denies any current dizziness, no SOB, no weakness, no chest pain

## 2015-05-17 NOTE — ED Notes (Signed)
Discharged home.  Taxi voucher given with approval from [employer] from house coverage.

## 2015-05-17 NOTE — ED Provider Notes (Signed)
CSN: [id number]     Arrival date & time 05/17/15  1925 History   First MD Initiated Contact with Patient 05/17/15 1932     Chief Complaint  Patient presents with  . Dizziness     (Consider location/radiation/quality/duration/timing/severity/associated sxs/prior Treatment) HPI Comments: 63 year old male presenting after an episode of dizziness occurring at 6 PM this evening, about 2 hours prior to arrival. States he was eating dinner, and when he went to stand up, he felt dizzy, described as a lightheaded feeling lasting a few seconds. The dizziness then went away on its own, when he got home and stood up to get out of the car, the dizziness returned, again only lasting a few seconds and has not returned since. Currently asymptomatic. Denies any other symptoms. Denies chest pain, shortness of breath, nausea, vomiting, diaphoresis, fever, chills, vision change, confusion, numbness, tingling or weakness. Recent admission to the hospital for diarrhea and hypotension, found to have possible viral gastroenteritis and orthostatic hypotension, states since being discharged, his symptoms have completely resolved and he has been feeling perfectly fine. He is back on his blood pressure medications. No longer has diarrhea.  Patient is a 64 y.o. male presenting with dizziness. The history is provided by the patient.  Dizziness   Past Medical History  Diagnosis Date  . Hypertension   . High cholesterol   . Hypothyroidism   . Seizure disorder   . Sleep apnea, obstructive     uses cpap machine .9  . Sleep apnea, obstructive   . Kidney stone     hx of  . Cancer     skin ca to scalp, sees Dr. Tonia Brooms  . Arthritis     left knee  . Cataract     bilateral  . Rash, skin     to left lower leg   . Asthma     "outgrew it from place I used to work at"  . Seizures 1966    "started as gran mal; lessened to no loss of consciousness nowl last ~ 2000" patient sees Dr. Floyde Parkins  . Peripheral edema   .  Gait disorder   . Memory loss   . Cervical myelopathy   . Cervical spondylosis with myelopathy 08/17/2013  . Abnormality of gait 08/17/2013   Past Surgical History  Procedure Laterality Date  . Cervical discectomy  03/2011  . Eye surgery  [phone]    "right eye; 4 times total; for detached retina"  . Cataract extraction w/ intraocular lens  implant, bilateral  1990's  . Retinal detachment surgery      x 4 right eye  . Anterior cervical decomp/discectomy fusion  02/21/2012    Procedure: ANTERIOR CERVICAL DECOMPRESSION/DISCECTOMY FUSION 1 LEVEL;  Surgeon: Hosie Spangle, MD;  Location: [facility];  Service: Neurosurgery;  Laterality: N/A;  Cervical Five-Six anterior cervical decompression with fusion plating and bonegraft   Family History  Problem Relation Age of Onset  . Hypertension Mother   . Heart disease Mother   . Hypertension Father   . Glaucoma Father   . Heart disease Father    History  Substance Use Topics  . Smoking status: Never Smoker   . Smokeless tobacco: Never Used  . Alcohol Use: Yes     Comment: 11/18/11 "been awhile since I've had anything to drink"    Review of Systems  Neurological: Positive for dizziness and light-headedness.  All other systems reviewed and are negative.     Allergies  Depakote and Latex  Home Medications   Prior to Admission medications   Medication Sig Start Date End Date Taking? Authorizing Provider  aspirin EC 81 MG tablet Take 81 mg by mouth daily.     Yes Historical Provider, MD  atorvastatin (LIPITOR) 40 MG tablet Take 40 mg by mouth every evening.     Yes Historical Provider, MD  benazepril (LOTENSIN) 20 MG tablet Take 1 tablet (20 mg total) by mouth every evening. 05/05/15  Yes Charlynne Cousins, MD  hydrochlorothiazide (HYDRODIURIL) 25 MG tablet Take 1 tablet (25 mg total) by mouth daily. Patient taking differently: Take 12.5 mg by mouth daily.  05/05/15  Yes Charlynne Cousins, MD  levETIRAcetam (KEPPRA) 750 MG  tablet TAKE 1 TABLET (750 MG TOTAL) BY MOUTH 2 (TWO) TIMES DAILY. 01/20/15  Yes Kathrynn Ducking, MD  levothyroxine (SYNTHROID, LEVOTHROID) 150 MCG tablet Take 300-450 mcg by mouth daily before breakfast. Pt takes 450mg  on MWF - pt takes 300mg  all other days   Yes Historical Provider, MD  metoprolol tartrate (LOPRESSOR) 25 MG tablet Take 25 mg by mouth 2 (two) times daily.     Yes Historical Provider, MD  Multiple Vitamin (MULITIVITAMIN WITH MINERALS) TABS Take 1 tablet by mouth daily.     Yes Historical Provider, MD  PHENobarbital (LUMINAL) 32.4 MG tablet TAKE 1 TABLET BY MOUTH EVERY DAY 04/22/15  Yes Kathrynn Ducking, MD  PHENobarbital (LUMINAL) 64.8 MG tablet TAKE 2 TABLETS BY MOUTH AT BEDTIME 04/22/15  Yes Kathrynn Ducking, MD   BP 117/62 mmHg  Pulse 97  Temp(Src) 97.9 F (36.6 C) (Oral)  Resp 18  Ht 6' (1.829 m)  Wt 293 lb (132.904 kg)  BMI 39.73 kg/m2  SpO2 94% Physical Exam  Constitutional: He is oriented to person, place, and time. He appears well-developed and well-nourished. No distress.  HENT:  Head: Normocephalic and atraumatic.  Mouth/Throat: Oropharynx is clear and moist.  Eyes: Conjunctivae and EOM are normal. Pupils are equal, round, and reactive to light.  Neck: Normal range of motion. Neck supple. No JVD present.  Cardiovascular: Normal rate, regular rhythm, normal heart sounds and intact distal pulses.   No extremity edema.  Pulmonary/Chest: Effort normal and breath sounds normal. No respiratory distress.  Abdominal: Soft. Bowel sounds are normal. There is no tenderness.  Musculoskeletal: Normal range of motion. He exhibits no edema.  Neurological: He is alert and oriented to person, place, and time. He has normal strength. No sensory deficit.  Speech fluent, goal oriented. Moves extremities without ataxia. Equal grip strength bilateral. Unable to reproduce lightheadedness.  Skin: Skin is warm and dry. He is not diaphoretic.  Psychiatric: He has a normal mood and  affect. His behavior is normal.  Nursing note and vitals reviewed.   ED Course  Procedures (including critical care time) Labs Review Labs Reviewed  BASIC METABOLIC PANEL - Abnormal; Notable for the following:    Sodium 132 (*)    Chloride 99 (*)    All other components within normal limits  CBC  I-STAT TROPOININ, ED    Imaging Review No results found.   EKG Interpretation   Date/Time:  Saturday May 17 2015 19:37:08 EDT Ventricular Rate:  93 PR Interval:  172 QRS Duration: 106 QT Interval:  348 QTC Calculation: 433 R Axis:   42 Text Interpretation:  Sinus rhythm Nonspecific T abnormalities, inferior  leads No significant change since last tracing Confirmed by HARRISON  MD,  FORREST ([id number]) on 05/17/2015 7:48:28 PM  MDM   Final diagnoses:  Orthostatic hypotension  Dizziness   Nontoxic appearing, NAD. AF VSS. Asymptomatic in the ED. Recent admission for orthostatic hypotension and diarrhea as stated above. Patient slightly orthostatic here today. Given a liter of fluids with improvement. Workup negative. Patient able to ambulate without difficulty. No focal neurologic deficits. Doubt CVA/TIA. Doubt cardiac. No CP/SOB. Symptoms have not returned. Stable for d/c. Return precautions given. Patient states understanding of treatment care plan and is agreeable.  Discussed with attending Dr. Aline Brochure who agrees with plan of care.    Carman Ching, PA-C 05/17/15 2227  Pamella Pert, MD 05/18/15 [phone]

## 2015-07-19 ENCOUNTER — Other Ambulatory Visit: Payer: Self-pay | Admitting: Neurology

## 2015-08-02 ENCOUNTER — Other Ambulatory Visit: Payer: Self-pay | Admitting: Neurology

## 2015-08-04 ENCOUNTER — Other Ambulatory Visit: Payer: Self-pay

## 2015-08-04 MED ORDER — PHENOBARBITAL 64.8 MG PO TABS: 129.6000 mg | ORAL_TABLET | Freq: Every day | ORAL | Status: DC

## 2015-08-04 MED ORDER — PHENOBARBITAL 32.4 MG PO TABS: 32.4000 mg | ORAL_TABLET | Freq: Every day | ORAL | Status: DC

## 2015-08-04 NOTE — Telephone Encounter (Signed)
Rx signed and faxed.

## 2015-08-29 ENCOUNTER — Ambulatory Visit: Payer: Medicare Other | Admitting: Adult Health

## 2015-09-02 ENCOUNTER — Ambulatory Visit: Payer: Self-pay | Admitting: Adult Health

## 2015-09-04 ENCOUNTER — Encounter: Payer: Self-pay | Admitting: Adult Health

## 2015-09-04 ENCOUNTER — Ambulatory Visit (INDEPENDENT_AMBULATORY_CARE_PROVIDER_SITE_OTHER): Payer: Medicare Other | Admitting: Adult Health

## 2015-09-04 VITALS — BP 119/72 | HR 86 | Ht 72.0 in | Wt 286.0 lb

## 2015-09-04 DIAGNOSIS — R569 Unspecified convulsions: Secondary | ICD-10-CM | POA: Diagnosis not present

## 2015-09-04 DIAGNOSIS — R269 Unspecified abnormalities of gait and mobility: Secondary | ICD-10-CM | POA: Diagnosis not present

## 2015-09-04 DIAGNOSIS — Z5181 Encounter for therapeutic drug level monitoring: Secondary | ICD-10-CM | POA: Diagnosis not present

## 2015-09-04 MED ORDER — PHENOBARBITAL 64.8 MG PO TABS: 129.6000 mg | ORAL_TABLET | Freq: Every day | ORAL | Status: DC

## 2015-09-04 MED ORDER — PHENOBARBITAL 32.4 MG PO TABS: 32.4000 mg | ORAL_TABLET | Freq: Every day | ORAL | Status: DC

## 2015-09-04 MED ORDER — LEVETIRACETAM 750 MG PO TABS: ORAL_TABLET | ORAL | Status: DC

## 2015-09-04 NOTE — Progress Notes (Signed)
I have read the note, and I agree with the clinical assessment and plan.  WILLIS,CHARLES KEITH   

## 2015-09-04 NOTE — Patient Instructions (Signed)
Continue Keppra and Phenobarbital I will check blood work today. If you have any seizures please let us know.

## 2015-09-04 NOTE — Progress Notes (Signed)
PATIENT: [name] DOB: 03-07-1951  REASON FOR VISIT: follow up-seizures, abnormality of gait HISTORY FROM: patient  HISTORY OF PRESENT ILLNESS: [name] is a 64 year old male with a history of seizures. He returns today for follow-up. The patient continues on phenobarbital and Keppra. He is tolerating these medications well. He lives at home alone. He is able to complete all ADLs independently. He does not operate a motor vehicle. The patient reports that in July he was hospitalized. He states that he was eating hormel dinners that resulted in diarrhea and he became severely dehydrated. The patient continues to have trouble with his gait due to a cervical myelopathy. He uses a cane when ambulating. Denies any recent falls. He returns today for an evaluation.  [name] is a 64 year old right-handed white male with a history of a chronic seizure disorder, currently on phenobarbital and Keppra. The patient has done well since last seen without recurrence of seizures. The patient does not operate a motor vehicle. 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 The patient reports some residual tingling in the hands, occasionally in the feet. The patient reports no falls since last seen, however. The patient walks with a cane when he is outside the house. The patient denies any new medical issues that have come up since last seen. He is tolerating the medications well. He indicates that the bowels and bladder are functioning well.  REVIEW OF SYSTEMS: Out of a complete 14 system review of symptoms, the patient complains only of the following symptoms, and all other reviewed systems are negative.  Joint pain, joint swelling, back pain, rash, numbness, leg swelling  ALLERGIES: Allergies  Allergen Reactions  . Depakote [Divalproex Sodium]     Intolerant  . Latex  Rash    Hands rash and  mild hives    HOME MEDICATIONS: Outpatient Prescriptions Prior to Visit  Medication Sig Dispense Refill  . aspirin EC 81 MG tablet Take 81 mg by mouth daily.      Marland Kitchen atorvastatin (LIPITOR) 40 MG tablet Take 40 mg by mouth every evening.      . benazepril (LOTENSIN) 20 MG tablet Take 1 tablet (20 mg total) by mouth every evening.    . hydrochlorothiazide (HYDRODIURIL) 25 MG tablet Take 1 tablet (25 mg total) by mouth daily. (Patient taking differently: Take 12.5 mg by mouth daily. )    . levETIRAcetam (KEPPRA) 750 MG tablet TAKE 1 TABLET (750 MG TOTAL) BY MOUTH 2 (TWO) TIMES DAILY. 180 tablet 0  . levothyroxine (SYNTHROID, LEVOTHROID) 150 MCG tablet Take 300-450 mcg by mouth daily before breakfast. Pt takes 450mg  on MWF - pt takes 300mg  all other days    . metoprolol tartrate (LOPRESSOR) 25 MG tablet Take 25 mg by mouth 2 (two) times daily.      . Multiple Vitamin (MULITIVITAMIN WITH MINERALS) TABS Take 1 tablet by mouth daily.      Marland Kitchen PHENobarbital (LUMINAL) 32.4 MG tablet Take 1 tablet (32.4 mg total) by mouth daily. 90 tablet 0  . PHENobarbital (LUMINAL) 64.8 MG tablet Take 2 tablets (129.6 mg total) by mouth at bedtime. 180 tablet 0   No facility-administered medications prior to visit.    PAST MEDICAL HISTORY: Past Medical History  Diagnosis Date  . Hypertension   . High cholesterol   . Hypothyroidism   . Seizure disorder   . Sleep apnea, obstructive  uses cpap machine .9  . Sleep apnea, obstructive   . Kidney stone     hx of  . Cancer     skin ca to scalp, sees Dr. Tonia Brooms  . Arthritis     left knee  . Cataract     bilateral  . Rash, skin     to left lower leg   . Asthma     "outgrew it from place I used to work at"  . Seizures 1966    "started as gran mal; lessened to no loss of consciousness nowl last ~ 2000" patient sees Dr. Floyde Parkins  . Peripheral edema   . Gait disorder   . Memory loss   . Cervical myelopathy   . Cervical  spondylosis with myelopathy 08/17/2013  . Abnormality of gait 08/17/2013    PAST SURGICAL HISTORY: Past Surgical History  Procedure Laterality Date  . Cervical discectomy  03/2011  . Eye surgery  [phone]    "right eye; 4 times total; for detached retina"  . Cataract extraction w/ intraocular lens  implant, bilateral  1990's  . Retinal detachment surgery      x 4 right eye  . Anterior cervical decomp/discectomy fusion  02/21/2012    Procedure: ANTERIOR CERVICAL DECOMPRESSION/DISCECTOMY FUSION 1 LEVEL;  Surgeon: Hosie Spangle, MD;  Location: [facility];  Service: Neurosurgery;  Laterality: N/A;  Cervical Five-Six anterior cervical decompression with fusion plating and bonegraft    FAMILY HISTORY: Family History  Problem Relation Age of Onset  . Hypertension Mother   . Heart disease Mother   . Hypertension Father   . Glaucoma Father   . Heart disease Father     SOCIAL HISTORY: Social History   Social History  . Marital Status: Married    Spouse Name: N/A  . Number of Children: N/A  . Years of Education: N/A   Occupational History  . Not on file.   Social History Main Topics  . Smoking status: Never Smoker   . Smokeless tobacco: Never Used  . Alcohol Use: Yes     Comment: 11/18/11 "been awhile since I've had anything to drink"  . Drug Use: No  . Sexual Activity: Not Currently   Other Topics Concern  . Not on file   Social History Narrative      PHYSICAL EXAM  Filed Vitals:   09/04/15 1418  BP: 119/72  Pulse: 86  Height: 6' (1.829 m)  Weight: 286 lb (129.729 kg)   Body mass index is 38.78 kg/(m^2).  Generalized: Well developed, in no acute distress   Neurological examination  Mentation: Alert oriented to time, place, history taking. Follows all commands speech and language fluent Cranial nerve II-XII: Pupils were equal round reactive to light. Extraocular movements were full, visual field were full on confrontational test. Facial sensation and  strength were normal. Uvula tongue midline. Head turning and shoulder shrug  were normal and symmetric. Motor: The motor testing reveals 5 over 5 strength of all 4 extremities. Good symmetric motor tone is noted throughout.  Sensory: Sensory testing is intact to soft touch on all 4 extremities. No evidence of extinction is noted.  Coordination: Cerebellar testing reveals good finger-nose-finger and heel-to-shin bilaterally.  Gait and station: Patient requires assistance when going from a sitting to a standing position. Patient uses a gait to ambulate. Gait is wide-based and unsteady. Tandem gait not attended.  Reflexes: Deep tendon reflexes are symmetric and normal bilaterally.   DIAGNOSTIC DATA (LABS, IMAGING, TESTING) -  I reviewed patient records, labs, notes, testing and imaging myself where available.  Lab Results  Component Value Date   WBC 8.5 05/17/2015   HGB 13.2 05/17/2015   HCT 39.3 05/17/2015   MCV 89.9 05/17/2015   PLT 227 05/17/2015      Component Value Date/Time   NA 132* 05/17/2015 2025   NA 138 08/28/2014 1042   K 3.9 05/17/2015 2025   CL 99* 05/17/2015 2025   CO2 23 05/17/2015 2025   GLUCOSE 78 05/17/2015 2025   GLUCOSE 90 08/28/2014 1042   BUN 13 05/17/2015 2025   BUN 18 08/28/2014 1042   CREATININE 1.03 05/17/2015 2025   CALCIUM 9.3 05/17/2015 2025   PROT 7.2 05/01/2015 1621   PROT 7.0 08/28/2014 1042   ALBUMIN 3.5 05/01/2015 1621   ALBUMIN 4.3 08/28/2014 1042   AST 21 05/01/2015 1621   ALT 20 05/01/2015 1621   ALKPHOS 145* 05/01/2015 1621   BILITOT 0.4 05/01/2015 1621   GFRNONAA >60 05/17/2015 2025   GFRAA >60 05/17/2015 2025    Lab Results  Component Value Date   TSH 0.021* 04/30/2015      ASSESSMENT AND PLAN 64 y.o. year old male  has a past medical history of Hypertension; High cholesterol; Hypothyroidism; Seizure disorder; Sleep apnea, obstructive; Sleep apnea, obstructive; Kidney stone; Cancer; Arthritis; Cataract; Rash, skin; Asthma;  Seizures (1966); Peripheral edema; Gait disorder; Memory loss; Cervical myelopathy; Cervical spondylosis with myelopathy (08/17/2013); and Abnormality of gait (08/17/2013). here with:  1. Seizures 2. Abnormality of gait due to cervical myelopathy  The patient has not had any seizure events in the last year. He will continue on phenobarbital and Keppra. I will refill these medications today. We will also check blood work today. Patient advised that if he has any seizure events he should let us know. He will follow-up in one year or sooner if needed.     Ward Givens, MSN, NP-C 09/04/2015, 2:19 PM [facility] [address] [place], [place] [address] [phone]

## 2015-09-05 LAB — CBC WITH DIFFERENTIAL/PLATELET
BASOS ABS: 0 10*3/uL (ref 0.0–0.2)
Basos: 1 %
EOS (ABSOLUTE): 0.2 10*3/uL (ref 0.0–0.4)
EOS: 3 %
HEMATOCRIT: 40.5 % (ref 37.5–51.0)
Hemoglobin: 13.2 g/dL (ref 12.6–17.7)
IMMATURE GRANULOCYTES: 0 %
Immature Grans (Abs): 0 10*3/uL (ref 0.0–0.1)
LYMPHS ABS: 1.4 10*3/uL (ref 0.7–3.1)
Lymphs: 23 %
MCH: 29.9 pg (ref 26.6–33.0)
MCHC: 32.6 g/dL (ref 31.5–35.7)
MCV: 92 fL (ref 79–97)
MONOS ABS: 0.9 10*3/uL (ref 0.1–0.9)
Monocytes: 16 %
NEUTROS PCT: 57 %
Neutrophils Absolute: 3.4 10*3/uL (ref 1.4–7.0)
Platelets: 259 10*3/uL (ref 150–379)
RBC: 4.42 x10E6/uL (ref 4.14–5.80)
RDW: 13.5 % (ref 12.3–15.4)
WBC: 6 10*3/uL (ref 3.4–10.8)

## 2015-09-05 LAB — COMPREHENSIVE METABOLIC PANEL
ALK PHOS: 165 IU/L — AB (ref 39–117)
ALT: 17 IU/L (ref 0–44)
AST: 23 IU/L (ref 0–40)
Albumin/Globulin Ratio: 1.5 (ref 1.1–2.5)
Albumin: 4.2 g/dL (ref 3.6–4.8)
BILIRUBIN TOTAL: 0.4 mg/dL (ref 0.0–1.2)
BUN / CREAT RATIO: 14 (ref 10–22)
BUN: 14 mg/dL (ref 8–27)
CO2: 27 mmol/L (ref 18–29)
CREATININE: 0.97 mg/dL (ref 0.76–1.27)
Calcium: 10 mg/dL (ref 8.6–10.2)
Chloride: 97 mmol/L (ref 97–108)
GFR calc Af Amer: 96 mL/min/{1.73_m2} (ref >59)
GFR calc non Af Amer: 83 mL/min/{1.73_m2} (ref >59)
GLOBULIN, TOTAL: 2.8 g/dL (ref 1.5–4.5)
Glucose: 81 mg/dL (ref 65–99)
Potassium: 4.7 mmol/L (ref 3.5–5.2)
SODIUM: 139 mmol/L (ref 134–144)
Total Protein: 7 g/dL (ref 6.0–8.5)

## 2015-09-05 LAB — PHENOBARBITAL LEVEL: Phenobarbital, Serum: 27 ug/mL (ref 15–40)

## 2015-09-08 ENCOUNTER — Telehealth: Payer: Self-pay

## 2015-09-08 NOTE — Telephone Encounter (Signed)
Spoke to patient. Gave lab results. Patient verbalized understanding.  

## 2015-09-08 NOTE — Telephone Encounter (Signed)
-----   Message from Ward Givens, NP sent at 09/08/2015  7:29 AM EDT ----- Lab work is ok. Alkaline phosphatase is slightly elevated. Could be due to medication. We will monitor. Please call the patient.

## 2015-09-22 NOTE — Telephone Encounter (Signed)
Returned pt's call. No answer, left a message asking him to call me back. 

## 2015-09-22 NOTE — Telephone Encounter (Signed)
Patient is calling back and states he needs to talk with the nurse again about his lab work.  Please call [phone]/

## 2015-09-22 NOTE — Telephone Encounter (Signed)
Spoke to [name]. He wants me to fax his recent labs to Dr. Lona Kettle at [facility] because he has an upcoming appt with Dr. Harrington Challenger. Advised [name] that I would fax the results over. Results were faxed at 2:34 today. [name] verbalized understanding.

## 2015-10-12 ENCOUNTER — Other Ambulatory Visit: Payer: Self-pay | Admitting: Neurology

## 2015-10-13 NOTE — Telephone Encounter (Signed)
Duplicate request.  6 refills were sent on 10/13.  Called the pharmacy and spoke with Lanelle Bal, she confirmed they do have refills on file.

## 2015-10-20 ENCOUNTER — Other Ambulatory Visit: Payer: Self-pay | Admitting: Neurology

## 2015-11-14 ENCOUNTER — Other Ambulatory Visit: Payer: Self-pay | Admitting: Neurology

## 2016-01-11 ENCOUNTER — Emergency Department (HOSPITAL_COMMUNITY): Payer: Medicare Other

## 2016-01-11 ENCOUNTER — Inpatient Hospital Stay (HOSPITAL_COMMUNITY)
Admission: EM | Admit: 2016-01-11 | Discharge: 2016-01-12 | DRG: 309 | Disposition: A | Discharge status: Home / Self Care | Payer: Medicare Other | Attending: Cardiovascular Disease | Admitting: Cardiovascular Disease

## 2016-01-11 ENCOUNTER — Encounter (HOSPITAL_COMMUNITY): Payer: Self-pay | Admitting: Emergency Medicine

## 2016-01-11 ENCOUNTER — Inpatient Hospital Stay (HOSPITAL_COMMUNITY): Payer: Medicare Other

## 2016-01-11 DIAGNOSIS — R079 Chest pain, unspecified: Secondary | ICD-10-CM | POA: Diagnosis present

## 2016-01-11 DIAGNOSIS — I4891 Unspecified atrial fibrillation: Secondary | ICD-10-CM | POA: Diagnosis not present

## 2016-01-11 DIAGNOSIS — I10 Essential (primary) hypertension: Secondary | ICD-10-CM | POA: Diagnosis present

## 2016-01-11 DIAGNOSIS — I471 Supraventricular tachycardia: Principal | ICD-10-CM | POA: Diagnosis present

## 2016-01-11 DIAGNOSIS — Z9842 Cataract extraction status, left eye: Secondary | ICD-10-CM | POA: Diagnosis not present

## 2016-01-11 DIAGNOSIS — Z981 Arthrodesis status: Secondary | ICD-10-CM

## 2016-01-11 DIAGNOSIS — I480 Paroxysmal atrial fibrillation: Secondary | ICD-10-CM

## 2016-01-11 DIAGNOSIS — Z79899 Other long term (current) drug therapy: Secondary | ICD-10-CM

## 2016-01-11 DIAGNOSIS — Z9841 Cataract extraction status, right eye: Secondary | ICD-10-CM | POA: Diagnosis not present

## 2016-01-11 DIAGNOSIS — E860 Dehydration: Secondary | ICD-10-CM | POA: Diagnosis present

## 2016-01-11 DIAGNOSIS — I472 Ventricular tachycardia, unspecified: Secondary | ICD-10-CM

## 2016-01-11 DIAGNOSIS — Z7982 Long term (current) use of aspirin: Secondary | ICD-10-CM

## 2016-01-11 DIAGNOSIS — I483 Typical atrial flutter: Secondary | ICD-10-CM | POA: Diagnosis not present

## 2016-01-11 DIAGNOSIS — N179 Acute kidney failure, unspecified: Secondary | ICD-10-CM | POA: Diagnosis present

## 2016-01-11 DIAGNOSIS — R0789 Other chest pain: Secondary | ICD-10-CM | POA: Diagnosis not present

## 2016-01-11 DIAGNOSIS — E039 Hypothyroidism, unspecified: Secondary | ICD-10-CM | POA: Diagnosis present

## 2016-01-11 DIAGNOSIS — G4733 Obstructive sleep apnea (adult) (pediatric): Secondary | ICD-10-CM | POA: Diagnosis present

## 2016-01-11 DIAGNOSIS — I499 Cardiac arrhythmia, unspecified: Secondary | ICD-10-CM | POA: Diagnosis not present

## 2016-01-11 DIAGNOSIS — I4892 Unspecified atrial flutter: Secondary | ICD-10-CM | POA: Diagnosis present

## 2016-01-11 DIAGNOSIS — E785 Hyperlipidemia, unspecified: Secondary | ICD-10-CM | POA: Diagnosis present

## 2016-01-11 DIAGNOSIS — Z961 Presence of intraocular lens: Secondary | ICD-10-CM | POA: Diagnosis present

## 2016-01-11 DIAGNOSIS — G40909 Epilepsy, unspecified, not intractable, without status epilepticus: Secondary | ICD-10-CM | POA: Diagnosis present

## 2016-01-11 DIAGNOSIS — I469 Cardiac arrest, cause unspecified: Secondary | ICD-10-CM | POA: Diagnosis present

## 2016-01-11 LAB — CBC WITH DIFFERENTIAL/PLATELET
BASOS ABS: 0 10*3/uL (ref 0.0–0.1)
Basophils Relative: 0 %
EOS ABS: 0.2 10*3/uL (ref 0.0–0.7)
EOS PCT: 2 %
HCT: 39.1 % (ref 39.0–52.0)
Hemoglobin: 12.4 g/dL — ABNORMAL LOW (ref 13.0–17.0)
Lymphocytes Relative: 17 %
Lymphs Abs: 1.2 10*3/uL (ref 0.7–4.0)
MCH: 30.1 pg (ref 26.0–34.0)
MCHC: 31.7 g/dL (ref 30.0–36.0)
MCV: 94.9 fL (ref 78.0–100.0)
Monocytes Absolute: 1.1 10*3/uL — ABNORMAL HIGH (ref 0.1–1.0)
Monocytes Relative: 14 %
NEUTROS ABS: 4.9 10*3/uL (ref 1.7–7.7)
Neutrophils Relative %: 67 %
PLATELETS: 191 10*3/uL (ref 150–400)
RBC: 4.12 MIL/uL — ABNORMAL LOW (ref 4.22–5.81)
RDW: 13.3 % (ref 11.5–15.5)
WBC: 7.4 10*3/uL (ref 4.0–10.5)

## 2016-01-11 LAB — BASIC METABOLIC PANEL
ANION GAP: 12 (ref 5–15)
BUN: 22 mg/dL — ABNORMAL HIGH (ref 6–20)
CALCIUM: 8.9 mg/dL (ref 8.9–10.3)
CO2: 20 mmol/L — ABNORMAL LOW (ref 22–32)
Chloride: 110 mmol/L (ref 101–111)
Creatinine, Ser: 1.26 mg/dL — ABNORMAL HIGH (ref 0.61–1.24)
GFR, EST NON AFRICAN AMERICAN: 59 mL/min — AB (ref >60)
Glucose, Bld: 96 mg/dL (ref 65–99)
Potassium: 4.1 mmol/L (ref 3.5–5.1)
SODIUM: 142 mmol/L (ref 135–145)

## 2016-01-11 LAB — MRSA PCR SCREENING: MRSA BY PCR: NEGATIVE

## 2016-01-11 LAB — I-STAT TROPONIN, ED: TROPONIN I, POC: 0.03 ng/mL (ref 0.00–0.08)

## 2016-01-11 LAB — TROPONIN I
Troponin I: 0.07 ng/mL — ABNORMAL HIGH (ref <0.031)
Troponin I: 0.05 ng/mL — ABNORMAL HIGH (ref <0.031)
Troponin I: 0.04 ng/mL — ABNORMAL HIGH (ref <0.031)

## 2016-01-11 LAB — T4, FREE: Free T4: 1.62 ng/dL — ABNORMAL HIGH (ref 0.61–1.12)

## 2016-01-11 LAB — D-DIMER, QUANTITATIVE: D-Dimer, Quant: 1.31 ug/mL-FEU — ABNORMAL HIGH (ref 0.00–0.50)

## 2016-01-11 LAB — MAGNESIUM: Magnesium: 1.6 mg/dL — ABNORMAL LOW (ref 1.7–2.4)

## 2016-01-11 LAB — PROTIME-INR
INR: 1.16 (ref 0.00–1.49)
PROTHROMBIN TIME: 15 s (ref 11.6–15.2)

## 2016-01-11 LAB — TSH: TSH: 0.043 u[IU]/mL — ABNORMAL LOW (ref 0.350–4.500)

## 2016-01-11 MED ORDER — METOPROLOL TARTRATE 1 MG/ML IV SOLN
5.0000 mg | Freq: Once | INTRAVENOUS | Status: AC
  Administered 2016-01-11: 5 mg via INTRAVENOUS
  Filled 2016-01-11: qty 5

## 2016-01-11 MED ORDER — ASPIRIN 81 MG PO CHEW
324.0000 mg | CHEWABLE_TABLET | Freq: Once | ORAL | Status: AC
  Administered 2016-01-11: 324 mg via ORAL
  Filled 2016-01-11: qty 4

## 2016-01-11 MED ORDER — ENOXAPARIN SODIUM 40 MG/0.4ML ~~LOC~~ SOLN
40.0000 mg | Freq: Every day | SUBCUTANEOUS | Status: DC
  Administered 2016-01-11: 40 mg via SUBCUTANEOUS
  Filled 2016-01-11: qty 0.4

## 2016-01-11 MED ORDER — IOHEXOL 350 MG/ML SOLN
80.0000 mL | Freq: Once | INTRAVENOUS | Status: AC | PRN
  Administered 2016-01-11: 100 mL via INTRAVENOUS

## 2016-01-11 MED ORDER — LEVOTHYROXINE SODIUM 100 MCG PO TABS: 300.0000 ug | ORAL_TABLET | Freq: Every day | ORAL | Status: DC

## 2016-01-11 MED ORDER — LEVOTHYROXINE SODIUM 100 MCG PO TABS
300.0000 ug | ORAL_TABLET | ORAL | Status: DC
  Administered 2016-01-11: 300 ug via ORAL
  Filled 2016-01-11: qty 3

## 2016-01-11 MED ORDER — PHENOBARBITAL 97.2 MG PO TABS
129.6000 mg | ORAL_TABLET | Freq: Every day | ORAL | Status: DC
  Administered 2016-01-11: 129.6 mg via ORAL
  Filled 2016-01-11: qty 1

## 2016-01-11 MED ORDER — SODIUM CHLORIDE 0.9 % IV SOLN
Freq: Once | INTRAVENOUS | Status: AC
  Administered 2016-01-11: 03:00:00 via INTRAVENOUS

## 2016-01-11 MED ORDER — SODIUM CHLORIDE 0.9% FLUSH
3.0000 mL | Freq: Two times a day (BID) | INTRAVENOUS | Status: DC
  Administered 2016-01-11 (×2): 3 mL via INTRAVENOUS

## 2016-01-11 MED ORDER — MAGNESIUM OXIDE 400 (241.3 MG) MG PO TABS
400.0000 mg | ORAL_TABLET | Freq: Once | ORAL | Status: AC
  Administered 2016-01-11: 400 mg via ORAL
  Filled 2016-01-11: qty 1

## 2016-01-11 MED ORDER — LEVOTHYROXINE SODIUM 50 MCG PO TABS
450.0000 ug | ORAL_TABLET | ORAL | Status: DC
  Administered 2016-01-12: 450 ug via ORAL
  Filled 2016-01-11: qty 4

## 2016-01-11 MED ORDER — ATORVASTATIN CALCIUM 40 MG PO TABS
40.0000 mg | ORAL_TABLET | Freq: Every evening | ORAL | Status: DC
  Administered 2016-01-11: 40 mg via ORAL
  Filled 2016-01-11: qty 1

## 2016-01-11 MED ORDER — DILTIAZEM HCL 100 MG IV SOLR: 5.0000 mg/h | INTRAVENOUS | Status: DC

## 2016-01-11 MED ORDER — SODIUM CHLORIDE 0.9 % IV BOLUS (SEPSIS): 1000.0000 mL | Freq: Once | INTRAVENOUS | Status: DC

## 2016-01-11 MED ORDER — DILTIAZEM HCL ER COATED BEADS 180 MG PO CP24
180.0000 mg | ORAL_CAPSULE | Freq: Every day | ORAL | Status: DC
  Administered 2016-01-11 – 2016-01-12 (×2): 180 mg via ORAL
  Filled 2016-01-11 (×2): qty 1

## 2016-01-11 MED ORDER — ASPIRIN EC 81 MG PO TBEC
81.0000 mg | DELAYED_RELEASE_TABLET | Freq: Every day | ORAL | Status: DC
  Administered 2016-01-11 – 2016-01-12 (×2): 81 mg via ORAL
  Filled 2016-01-11 (×2): qty 1

## 2016-01-11 MED ORDER — DEXTROSE 5 % IV SOLN
5.0000 mg/h | Freq: Once | INTRAVENOUS | Status: AC
  Administered 2016-01-11: 5 mg/h via INTRAVENOUS
  Filled 2016-01-11: qty 100

## 2016-01-11 MED ORDER — LEVETIRACETAM 750 MG PO TABS
750.0000 mg | ORAL_TABLET | Freq: Two times a day (BID) | ORAL | Status: DC
  Administered 2016-01-11 – 2016-01-12 (×3): 750 mg via ORAL
  Filled 2016-01-11 (×4): qty 1

## 2016-01-11 MED ORDER — MAGNESIUM SULFATE IN D5W 10-5 MG/ML-% IV SOLN
1.0000 g | Freq: Once | INTRAVENOUS | Status: AC
  Administered 2016-01-11: 1 g via INTRAVENOUS
  Filled 2016-01-11: qty 100

## 2016-01-11 MED ORDER — PHENOBARBITAL 32.4 MG PO TABS
32.4000 mg | ORAL_TABLET | Freq: Every day | ORAL | Status: DC
  Administered 2016-01-11 – 2016-01-12 (×2): 32.4 mg via ORAL
  Filled 2016-01-11 (×2): qty 1

## 2016-01-11 NOTE — ED Provider Notes (Addendum)
CSN: [id number]     Arrival date & time 01/11/16  0029 History  By signing my name below, I, [facility], attest that this documentation has been prepared under the direction and in the presence of Merryl Hacker, MD. Electronically Signed: Virgel Bouquet, ED Scribe. 01/11/2016. 4:07 AM.   Chief Complaint  Patient presents with  . Palpitations   The history is provided by the patient and medical records. No language interpreter was used.   HPI Comments: [name] is a 65 y.o. male brought in by EMS with an hx of HTN and HLN who presents to the Emergency Department complaining of constant, burning, gradually worsening CP that he likens to heartburn onset yesterday afternoon after eating. Patient reports that he ate a pizza yesterday followed by onset of CP but that he did not call EMS until later that night. He states that he has been eating and drinking normally. He notes similar symptoms in the past for which he was evaluated and diagnosed with dehydration. Per medical records, patient was evaulated in the [facility] ED last year where he was diagnosed with orthostatic hypotension and admitted to the hospital. Denies hx of CAD. Denies dizziness and leg edema.  Per EMS, patient had a heart rate in the 240s. Spontaneously converted to an irregular rhythm in the 120s with IV stick. Patient denies any history of atrial fibrillation or irregular heart rate.  Past Medical History  Diagnosis Date  . Hypertension   . High cholesterol   . Hypothyroidism   . Seizure disorder ([place])   . Sleep apnea, obstructive     uses cpap machine .9  . Sleep apnea, obstructive   . Kidney stone     hx of  . Cancer ([place])     skin ca to scalp, sees Dr. Tonia Brooms  . Arthritis     left knee  . Cataract     bilateral  . Rash, skin     to left lower leg   . Asthma     "outgrew it from place I used to work at"  . Seizures ([place]) 1966    "started as gran mal; lessened to no loss of  consciousness nowl last ~ 2000" patient sees Dr. Floyde Parkins  . Peripheral edema   . Gait disorder   . Memory loss   . Cervical myelopathy ([place])   . Cervical spondylosis with myelopathy 08/17/2013  . Abnormality of gait 08/17/2013   Past Surgical History  Procedure Laterality Date  . Cervical discectomy  03/2011  . Eye surgery  [phone]    "right eye; 4 times total; for detached retina"  . Cataract extraction w/ intraocular lens  implant, bilateral  1990's  . Retinal detachment surgery      x 4 right eye  . Anterior cervical decomp/discectomy fusion  02/21/2012    Procedure: ANTERIOR CERVICAL DECOMPRESSION/DISCECTOMY FUSION 1 LEVEL;  Surgeon: Hosie Spangle, MD;  Location: [facility];  Service: Neurosurgery;  Laterality: N/A;  Cervical Five-Six anterior cervical decompression with fusion plating and bonegraft   Family History  Problem Relation Age of Onset  . Hypertension Mother   . Heart disease Mother   . Hypertension Father   . Glaucoma Father   . Heart disease Father    Social History  Substance Use Topics  . Smoking status: Never Smoker   . Smokeless tobacco: Never Used  . Alcohol Use: Yes     Comment: 11/18/11 "been awhile since I've had anything  to drink"    Review of Systems  Constitutional: Negative for fever.  Cardiovascular: Positive for chest pain. Negative for leg swelling.  Neurological: Negative for dizziness.  All other systems reviewed and are negative.     Allergies  Depakote and Latex  Home Medications   Prior to Admission medications   Medication Sig Start Date End Date Taking? Authorizing Provider  aspirin EC 81 MG tablet Take 81 mg by mouth daily.     Yes Historical Provider, MD  atorvastatin (LIPITOR) 40 MG tablet Take 40 mg by mouth every evening.     Yes Historical Provider, MD  benazepril (LOTENSIN) 20 MG tablet Take 1 tablet (20 mg total) by mouth every evening. 05/05/15  Yes Charlynne Cousins, MD  hydrochlorothiazide (HYDRODIURIL)  25 MG tablet Take 1 tablet (25 mg total) by mouth daily. Patient taking differently: Take 12.5 mg by mouth daily.  05/05/15  Yes Charlynne Cousins, MD  levETIRAcetam (KEPPRA) 750 MG tablet TAKE 1 TABLET (750 MG TOTAL) BY MOUTH 2 (TWO) TIMES DAILY. 09/04/15  Yes Ward Givens, NP  levothyroxine (SYNTHROID, LEVOTHROID) 150 MCG tablet Take 300-450 mcg by mouth daily before breakfast. Pt takes 450mg  on MWF - pt takes 300mg  all other days   Yes Historical Provider, MD  metoprolol tartrate (LOPRESSOR) 25 MG tablet Take 25 mg by mouth 2 (two) times daily.     Yes Historical Provider, MD  Multiple Vitamin (MULITIVITAMIN WITH MINERALS) TABS Take 1 tablet by mouth every morning.    Yes Historical Provider, MD  PHENobarbital (LUMINAL) 32.4 MG tablet Take 1 tablet (32.4 mg total) by mouth daily. 09/04/15  Yes Ward Givens, NP  PHENobarbital (LUMINAL) 64.8 MG tablet Take 2 tablets (129.6 mg total) by mouth at bedtime. 09/04/15  Yes Ward Givens, NP   BP 92/79 mmHg  Pulse 127  Resp 19  Ht 6' (1.829 m)  Wt 280 lb (127.007 kg)  BMI 37.97 kg/m2  SpO2 94% Physical Exam  Constitutional: He is oriented to person, place, and time. He appears well-developed and well-nourished. No distress.  HENT:  Head: Normocephalic and atraumatic.  Cardiovascular: Normal heart sounds.   No murmur heard. Tachycardia, irregular rhythm  Pulmonary/Chest: Effort normal and breath sounds normal. No respiratory distress. He has no wheezes.  Abdominal: Soft. Bowel sounds are normal. There is no tenderness. There is no rebound.  Musculoskeletal: He exhibits edema.  2+ bilateral lower extremity symmetric edema  Lymphadenopathy:    He has no cervical adenopathy.  Neurological: He is alert and oriented to person, place, and time.  Skin: Skin is warm and dry.  Psychiatric: He has a normal mood and affect.  Nursing note and vitals reviewed.   ED Course  Procedures (including critical care time)  CRITICAL CARE Performed  by: Merryl Hacker   Total critical care time: 50 minutes  Critical care time was exclusive of separately billable procedures and treating other patients.  Critical care was necessary to treat or prevent imminent or life-threatening deterioration.  Critical care was time spent personally by me on the following activities: development of treatment plan with patient and/or surrogate as well as nursing, discussions with consultants, evaluation of patient's response to treatment, examination of patient, obtaining history from patient or surrogate, ordering and performing treatments and interventions, ordering and review of laboratory studies, ordering and review of radiographic studies, pulse oximetry and re-evaluation of patient's condition.    COORDINATION OF CARE: 12:46 AM Discussed treatment plan with pt at bedside and pt  agreed to plan.   Labs Review Labs Reviewed  CBC WITH DIFFERENTIAL/PLATELET - Abnormal; Notable for the following:    RBC 4.12 (*)    Hemoglobin 12.4 (*)    Monocytes Absolute 1.1 (*)    All other components within normal limits  BASIC METABOLIC PANEL - Abnormal; Notable for the following:    CO2 20 (*)    BUN 22 (*)    Creatinine, Ser 1.26 (*)    GFR calc non Af Amer 59 (*)    All other components within normal limits  MAGNESIUM - Abnormal; Notable for the following:    Magnesium 1.6 (*)    All other components within normal limits  TSH  D-DIMER, QUANTITATIVE (NOT AT [facility])  Randolm Idol, ED    Imaging Review Dg Chest Portable 1 View  01/11/2016  CLINICAL DATA:  65 year old male with centralized chest pain EXAM: PORTABLE CHEST 1 VIEW COMPARISON:  Radiograph dated 03/08/2012 FINDINGS: Single-view of the chest demonstrates mild eventration of the left hemidiaphragm with subsegmental atelectatic changes of the left lung base. Pneumonia is less likely. There is no focal consolidation, pleural effusion, or pneumothorax. Top-normal cardiac size. Minimal  prominence of the central vasculature may represent mild congestive changes. The osseous structures appear unremarkable. IMPRESSION: Left lung base subsegmental atelectasis.  Pneumonia is less likely. Electronically Signed   By: Anner Crete M.D.   On: 01/11/2016 01:10   I have personally reviewed and evaluated these images and lab results as part of my medical decision-making.   EKG Interpretation   Date/Time:  Sunday January 11 2016 00:40:48 EST Ventricular Rate:  143 PR Interval:    QRS Duration: 104 QT Interval:  261 QTC Calculation: 402 R Axis:   37 Text Interpretation:  Atrial fibrillation Repol abnrm suggests ischemia,  diffuse leads Baseline wander in lead(s) II ST depressions 1 and AVL  Confirmed by HORTON  MD, COURTNEY ([id number]) on 01/11/2016 1:34:06 AM      MDM   Final diagnoses:  Atrial fibrillation with RVR ([place])    Patient presents with tachycardia. I was called emergently into the room as patient's heart rate was in the 230s. It appears to be a wide complexrhythm on the monitor, Vtach. Patient was asymptomatic and he spontaneously converted to atrial fibrillation with RVR with a rate in the 130s while being hooked up to the [place]. No history of the same. Denies any symptoms at this time. Chest pain has resolved. Denies shortness of breath. Denies recent travel. Leg swelling is at baseline. He was given one dose of metoprolol with minimal improvement of his heart rate. He was placed on a diltiazem drip. However, blood pressure does not leave much room for titration. He was given 1500 mL of fluid and has evidence of mild AKI on his metabolic panel with likely some mild dehydration. Discussed with cardiology, Dr. Rosanna Randy. They will assess. I have added a TSH and a d-dimer. Magnesium is also being replaced.  4:45AM Informant nursing that patient's blood pressure now in the [id number] systolic. Dilt drip held.  Hypotension in the setting of magnesium infusion. Magnesium stopped.  Cardiology is at the bedside. They have reviewed the strips and are concerned for VTach as the patient's original rhythm. He continues to be in A. fib with RVR with a rate in the 110s. D-dimer is elevated. CTA pending. If CTA is negative, cardiology to admit primarily.  I personally performed the services described in this documentation, which was scribed in my presence. The recorded  information has been reviewed and is accurate.    Merryl Hacker, MD 01/11/16 0519  5:45 AM Cardiology will primarily admit. CT is done. Oral magnesium ordered and I have requested that the nurse infuse IV magnesium slower.  Merryl Hacker, MD 01/11/16 [phone]

## 2016-01-11 NOTE — ED Notes (Signed)
Pt ate a pizza around 430 yesterday and began to experience "heart burn" Called EMS out, found him to have a heartrate of 240bpm.  When they stuck him for the IV, he converted briefly, but then converted when he stood up to get on the strecher.  He has been hypotensive (73/43), was given 725ml of NS.  Reports that this happened once before when he was dehydrated.

## 2016-01-11 NOTE — Consult Note (Addendum)
History & Physical    Patient ID: [name] MRN: [id number], DOB/AGE: Mar 18, 1951 \ Admit date: 01/11/2016 Primary Physician:  Melinda Crutch, MD  CC:  CP  HPI   65 yo M w a h/o HTN, HLD, Hypothyroidism, Seizure disorder, OSA, & cervical myelopthy presented to the ED this evening by ambulance with burning CP described as "similar to heartburn" that started 01/10/16 after eating pizza at 8:00 pm, though he did not call EMS until midnight.  In more detail, he described the discomfort as a 6/10, constant, non-radiating discomfort associated with lightheadedness & diaphoresis, worse with lying down.  He denied palpitations or syncope.  The pain resolved on the ambulance with resolution of his tachyarrhythmia when an IV was placed.  He has never had a similar pain before.  At baseline, he walks with a cane, lives alone, & performs all of his own ADL's.  He cannot drive because of his seizure history but walks in the grocery store when he is driven there.  He has never had a cardiac stress test.  He has chronic lower extremity swelling.  He denied dyspnea, orthopnea, or paroxysmal nocturnal dyspnea.  He also denied recent fevers or diarrhea.  He has not had any medication changes recently.    Review of his telemetry on arrival revealed a regular, wide-complex rhythm with absence of an RS complex highly suggestive of VT.  Rate was in the 240's.  This lasted ~ 1 minute & spontaneously converted without intervention.  This subsequently transitioned into Afib with RVR & later Aflutter with variable conduction.  There are also rhythm strips by EMS suggestive of a re-entrant SVT with clear retrograde P waves.  With tachycardia, there were severe anterolateral ST-T abnormalities that improved with rate control.  His initial troponin was negative.  His D-dimer was elevated, but a CTA was negative for evidence of PE.  While in the ED, he was started on a Diltiazem gtt.    Past Medical History  Diagnosis Date  .  Hypertension   . High cholesterol   . Hypothyroidism   . Seizure disorder ([place])   . Sleep apnea, obstructive     uses cpap machine .9  . Sleep apnea, obstructive   . Kidney stone     hx of  . Cancer ( [place])     skin ca to scalp, sees Dr. Tonia Brooms  . Arthritis     left knee  . Cataract     bilateral  . Rash, skin     to left lower leg   . Asthma     "outgrew it from place I used to work at"  . Seizures ([place]) 1966    "started as gran mal; lessened to no loss of consciousness nowl last ~ 2000" patient sees Dr. Floyde Parkins  . Peripheral edema   . Gait disorder   . Memory loss   . Cervical myelopathy ([place])   . Cervical spondylosis with myelopathy 08/17/2013  . Abnormality of gait 08/17/2013    Past Surgical History  Procedure Laterality Date  . Cervical discectomy  03/2011  . Eye surgery  [phone]    "right eye; 4 times total; for detached retina"  . Cataract extraction w/ intraocular lens  implant, bilateral  1990's  . Retinal detachment surgery      x 4 right eye  . Anterior cervical decomp/discectomy fusion  02/21/2012    Procedure: ANTERIOR CERVICAL DECOMPRESSION/DISCECTOMY FUSION 1 LEVEL;  Surgeon: Hosie Spangle, MD;  Location:  [facility];  Service: Neurosurgery;  Laterality: N/A;  Cervical Five-Six anterior cervical decompression with fusion plating and bonegraft    Allergies  Allergies  Allergen Reactions  . Depakote [Divalproex Sodium]     Intolerant  . Latex Rash    Hands rash and  mild hives   Home Medications    Prior to Admission medications   Medication Sig Start Date End Date Taking? Authorizing Provider  aspirin EC 81 MG tablet Take 81 mg by mouth daily.     Yes Historical Provider, MD  atorvastatin (LIPITOR) 40 MG tablet Take 40 mg by mouth every evening.     Yes Historical Provider, MD  benazepril (LOTENSIN) 20 MG tablet Take 1 tablet (20 mg total) by mouth every evening. 05/05/15  Yes Charlynne Cousins, MD  hydrochlorothiazide (HYDRODIURIL) 25 MG  tablet Take 1 tablet (25 mg total) by mouth daily. Patient taking differently: Take 12.5 mg by mouth daily.  05/05/15  Yes Charlynne Cousins, MD  levETIRAcetam (KEPPRA) 750 MG tablet TAKE 1 TABLET (750 MG TOTAL) BY MOUTH 2 (TWO) TIMES DAILY. 09/04/15  Yes Ward Givens, NP  levothyroxine (SYNTHROID, LEVOTHROID) 150 MCG tablet Take 300-450 mcg by mouth daily before breakfast. Pt takes 450mg  on MWF - pt takes 300mg  all other days   Yes Historical Provider, MD  metoprolol tartrate (LOPRESSOR) 25 MG tablet Take 25 mg by mouth 2 (two) times daily.     Yes Historical Provider, MD  Multiple Vitamin (MULITIVITAMIN WITH MINERALS) TABS Take 1 tablet by mouth every morning.    Yes Historical Provider, MD  PHENobarbital (LUMINAL) 32.4 MG tablet Take 1 tablet (32.4 mg total) by mouth daily. 09/04/15  Yes Ward Givens, NP  PHENobarbital (LUMINAL) 64.8 MG tablet Take 2 tablets (129.6 mg total) by mouth at bedtime. 09/04/15  Yes Ward Givens, NP   Family History    Family History  Problem Relation Age of Onset  . Hypertension Mother   . Heart disease Mother 53  . Hypertension Father   . Glaucoma Father    Social History    Social History   Social History  . Marital Status: Married    Spouse Name: N/A  . Number of Children: 0  . Years of Education: N/A   Occupational History  . 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 Smoking status: Never Smoker   . Smokeless tobacco: Never Used  . Alcohol Use: No  . Drug Use: No  . Sexual Activity: Not Currently   Other Topics Concern  . Not on file   Social History Narrative     Review of Systems    General:  No chills, fever, night sweats or weight changes.  Cardiovascular:  Positive for lightheadedness, chronic edema.  No chest pain, dyspnea on exertion, edema, orthopnea, palpitations, paroxysmal nocturnal dyspnea. Dermatological: No rash, lesions/masses Respiratory: No cough,  dyspnea Urologic: No hematuria, dysuria Abdominal:   No nausea, vomiting, diarrhea, bright red blood per rectum, melena, or hematemesis Neurologic:  No visual changes, wkns, changes in mental status. All other systems reviewed and are otherwise negative except as noted above.  Physical Exam    Blood pressure 98/67, pulse 101, temperature 98.6 F (37 C), temperature source Oral, resp. rate 14, height 6' (1.829 m), weight 127.007 kg (280 lb), SpO2 92 %.  General: Pleasant, NAD Psych: Normal affect. Neuro: Alert and oriented X 3. Moves all extremities spontaneously. HEENT: Normal  Neck: Supple  without bruits or JVD. Lungs:  Resp regular and unlabored, CTA. Heart: Irregularly irregular, no m/r/g Abdomen: Soft, non-tender, non-distended, BS + x 4.  Extremities: +1 PE b/l LE. DP/PT/Radials 2+ and equal bilaterally.  Labs    Troponin [facility] of Care Test)  Recent Labs  01/11/16 0140  TROPIPOC 0.03   No results for input(s): CKTOTAL, CKMB, TROPONINI in the last 72 hours. Lab Results  Component Value Date   WBC 7.4 01/11/2016   HGB 12.4* 01/11/2016   HCT 39.1 01/11/2016   MCV 94.9 01/11/2016   PLT 191 01/11/2016    Recent Labs Lab 01/11/16 0110  NA 142  K 4.1  CL 110  CO2 20*  BUN 22*  CREATININE 1.26*  CALCIUM 8.9  GLUCOSE 96   No results found for: CHOL, HDL, LDLCALC, TRIG Lab Results  Component Value Date   DDIMER 1.31* 01/11/2016     Radiology Studies    Ct Angio Chest Pe W/cm &/or Wo Cm  01/11/2016  CLINICAL DATA:  Constant burning in worsening chest pain similar to heartburn which began yesterday after eating a pizza. Elevated D-dimer EXAM: CT ANGIOGRAPHY CHEST WITH CONTRAST TECHNIQUE: Multidetector CT imaging of the chest was performed using the standard protocol during bolus administration of intravenous contrast. Multiplanar CT image reconstructions and MIPs were obtained to evaluate the vascular anatomy. CONTRAST:  149mL OMNIPAQUE IOHEXOL 350 MG/ML SOLN  COMPARISON:  None. FINDINGS: Technically adequate study with good opacification of the central and segmental pulmonary arteries. No focal filling defects demonstrated. No evidence of significant pulmonary embolus. Normal heart size. Coronary artery calcifications. Normal caliber thoracic aorta. No aortic dissection. Great vessel origins are patent. Esophagus is decompressed. No significant lymphadenopathy in the chest. Motion artifact limits evaluation of the lungs. Atelectasis or consolidation in the left lung base. Mild atelectasis in the right lung base. No pleural effusions. No pneumothorax. Included portions of the upper abdominal organs are grossly unremarkable. Degenerative changes in the spine. No destructive bone lesions. Review of the MIP images confirms the above findings. IMPRESSION: No evidence of significant pulmonary embolus. Consolidation in the left lower lung which may represent pneumonia or atelectasis. Mild atelectasis in the right lung base. Coronary artery calcifications. Electronically Signed   By: Lucienne Capers M.D.   On: 01/11/2016 05:52   Dg Chest Portable 1 View  01/11/2016  CLINICAL DATA:  65 year old male with centralized chest pain EXAM: PORTABLE CHEST 1 VIEW COMPARISON:  Radiograph dated 03/08/2012 FINDINGS: Single-view of the chest demonstrates mild eventration of the left hemidiaphragm with subsegmental atelectatic changes of the left lung base. Pneumonia is less likely. There is no focal consolidation, pleural effusion, or pneumothorax. Top-normal cardiac size. Minimal prominence of the central vasculature may represent mild congestive changes. The osseous structures appear unremarkable. IMPRESSION: Left lung base subsegmental atelectasis.  Pneumonia is less likely. Electronically Signed   By: Anner Crete M.D.   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  # Wide complex tachycardia - While aberrancy related to his atrial rhythms is not impossible, the morphology of the rhythm was highly concerning (printed & placed with his other strips) for sustained VT.   - As there was the hopefully reversible cause of hypomagnesemia, will hold off on Amiodarone while this is being repleted. -  Structural heart disease & ischemia will also likely need to be evaluated with serial cardiac enzymes, TTE, & stress testing versus invasive evaluation.    # Atrial arrhythmias - Afib, Aflutter, & possible AVNRT.  CHA2DS2-VASc = 1. - We will continue his Diltiazem gtt for now. - We have advised initiation of ASA.   - Will make NPO in case of DCCV today +/- TEE (symptoms probably started today).  # History of Hypothyroidism - Receiving Levothyroxine but with low TSH. - Will check a FT4 & TT3.   - We may need to consider decreasing his Levothyroxine dose.  # AKI - Hold Furosemide for now.  # h/o HTN - He is currently borderline hypotensive.   - Hold his home Benazepil, HCTZ,   # h/o HLD - We will continue his home Atorvastatin.  # h/o Seizure disorder - Will continue his home medications.   Signed, Alfonso Ramus, MD 01/11/2016, 6:21 AM

## 2016-01-11 NOTE — Consult Note (Signed)
ELECTROPHYSIOLOGY CONSULT NOTE    Primary Care Physician:  Melinda Crutch, MD Referring Physician:  Dr Rosanna Randy  Admit Date: 01/11/2016  Reason for consultation: tachycardia  [name] is a 65 y.o. male with a h/o HTN, Seizure disorder, OSA, & cervical myelopthy who presented with wide complex tachycardia.  He reports having burning chest pain.  He describes this as a 6/10, constant, non-radiating discomfort associated with lightheadedness & diaphoresis, worse with lying down.  He has never had a similar pain before.  He is unaware of any prior tachyarrhythmias. He was noted to have a wide complex tachycardia which terminated with resolution of his symptoms.  He is currently in sinus rhythm and resting comfortably without concerns.  At baseline, he live with a cane, lives alone, & performs all of his own ADL's. He cannot drive because of his seizure history but walks in the grocery store when he is driven there.  Today, he denies symptoms of palpitations, chest pain, shortness of breath, orthopnea, PND,  dizziness, presyncope, syncope, or neurologic sequela. + chronic edema. The patient is tolerating medications without difficulties and is otherwise without complaint today.   Past Medical History  Diagnosis Date  . Hypertension   . High cholesterol   . Hypothyroidism   . Seizure disorder ([place])   . Sleep apnea, obstructive     uses cpap machine .9  . Sleep apnea, obstructive   . Kidney stone     hx of  . Cancer ([place])     skin ca to scalp, sees Dr. Tonia Brooms  . Arthritis     left knee  . Cataract     bilateral  . Rash, skin     to left lower leg   . Asthma     "outgrew it from place I used to work at"  . Seizures ([place]) 1966    "started as gran mal; lessened to no loss of consciousness nowl last ~ 2000" patient sees Dr. Floyde Parkins  . Peripheral edema   . Gait disorder   . Memory loss   . Cervical myelopathy ([place])   . Cervical spondylosis with myelopathy 08/17/2013  .  Abnormality of gait 08/17/2013   Past Surgical History  Procedure Laterality Date  . Cervical discectomy  03/2011  . Eye surgery  [phone]    "right eye; 4 times total; for detached retina"  . Cataract extraction w/ intraocular lens  implant, bilateral  1990's  . Retinal detachment surgery      x 4 right eye  . Anterior cervical decomp/discectomy fusion  02/21/2012    Procedure: ANTERIOR CERVICAL DECOMPRESSION/DISCECTOMY FUSION 1 LEVEL;  Surgeon: Hosie Spangle, MD;  Location: [facility];  Service: Neurosurgery;  Laterality: N/A;  Cervical Five-Six anterior cervical decompression with fusion plating and bonegraft    . aspirin EC  81 mg Oral Daily  . atorvastatin  40 mg Oral QPM  . enoxaparin (LOVENOX) injection  40 mg Subcutaneous Daily  . levETIRAcetam  750 mg Oral BID  . [START ON 01/12/2016] levothyroxine  450 mcg Oral Q M,W,F   And  . levothyroxine  300 mcg Oral Q T,Th,S,Su  . PHENobarbital  129.6 mg Oral QHS  . PHENobarbital  32.4 mg Oral Daily  . sodium chloride  1,000 mL Intravenous Once  . sodium chloride flush  3 mL Intravenous Q12H   . diltiazem (CARDIZEM) infusion      Allergies  Allergen Reactions  . Depakote [Divalproex Sodium]     Intolerant  .  Latex Rash    Hands rash and  mild hives    Social History   Social History  . Marital Status: Married    Spouse Name: N/A  . Number of Children: 0  . Years of Education: N/A   Occupational History  . 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 Smoking status: Never Smoker   . Smokeless tobacco: Never Used  . Alcohol Use: No  . Drug Use: No  . Sexual Activity: Not Currently   Other Topics Concern  . Not on file   Social History Narrative    Family History  Problem Relation Age of Onset  . Hypertension Mother   . Heart disease Mother 34  . Hypertension Father   . Glaucoma Father     ROS- All systems are reviewed and negative except as per the HPI  above  Physical Exam: Telemetry: Filed Vitals:   01/11/16 0500 01/11/16 0600 01/11/16 0615 01/11/16 0630  BP: 96/72 105/80 98/67 97/79   Pulse: 70 71 101 107  Temp:      TempSrc:      Resp: 14 16 14 24   Height:      Weight:      SpO2: 95% 93% 92% 94%    GEN- The patient is well appearing, alert and oriented x 3 today.   Head- normocephalic, atraumatic Eyes-  Sclera clear, conjunctiva pink Ears- hearing intact Oropharynx- clear Neck- supple,   Lungs- Clear to ausculation bilaterally, normal work of breathing Heart- Regular rate and rhythm, no murmurs, rubs or gallops, PMI not laterally displaced GI- soft, NT, ND, + BS Extremities- no clubbing, cyanosis, or edema MS- no significant deformity or atrophy Skin- no rash or lesion Psych- euthymic mood, full affect Neuro- strength and sensation are intact  EKG reveals afib with RVR, ST depression noted  Labs:   Lab Results  Component Value Date   WBC 7.4 01/11/2016   HGB 12.4* 01/11/2016   HCT 39.1 01/11/2016   MCV 94.9 01/11/2016   PLT 191 01/11/2016    Recent Labs Lab 01/11/16 0110  NA 142  K 4.1  CL 110  CO2 20*  BUN 22*  CREATININE 1.26*  CALCIUM 8.9  GLUCOSE 96   Lab Results  Component Value Date   CKTOTAL 81 11/14/2011   CKMB 5.5* 11/14/2011   TROPONINI 0.07* 01/11/2016   No results found for: CHOL No results found for: HDL No results found for: LDLCALC No results found for: TRIG No results found for: CHOLHDL No results found for: LDLDIRECT     Echo from 2012 reviewed  ASSESSMENT AND PLAN:   1. Wide complex tachycardia The patients initial tachycardia was a wide complex rhythm.  Unfortunately, we do not have a 12 lear of this.  I cannot therefore determine whether this was SVT or VT.  Interestingly, the cycle length (225 msec) is identical to subsequent atrial flutter cycle length.  It is therefore possible that this arrhythmia was atrial flutter with 1:1 conduction.  As the arrhythmia was  hemodynamically stable, I am not certain that we would treat them differently. Will obtain echo and cycle CMs today. Anticipate myoview if echo is normal and CMs are ok.  If rules in for MI, would consider cath.  2. Afib/ atrial flutter Clearly documented and likely primary arrhythmia chads2vasc score is 1.  Will decide on anticoagulation once we have more information.  Given seizure history, may be better to avoid  anticoagulation at this time unless arrhythmia recurs Get echo as above Transition cardizem to po  3. HTN Stable No change required today  Transfer to telemetry  Thompson Grayer, MD 01/11/2016  8:56 AM

## 2016-01-11 NOTE — Progress Notes (Signed)
*  PRELIMINARY RESULTS* Echocardiogram 2D Echocardiogram has been performed.  Leavy Cella 01/11/2016, 12:24 PM

## 2016-01-11 NOTE — ED Notes (Signed)
Notified provider that pt's bp was soft, provider ordered to stop Magnesium and decrease Cardizem to 2.5.

## 2016-01-12 ENCOUNTER — Other Ambulatory Visit: Payer: Self-pay | Admitting: Physician Assistant

## 2016-01-12 DIAGNOSIS — R079 Chest pain, unspecified: Secondary | ICD-10-CM

## 2016-01-12 LAB — BASIC METABOLIC PANEL
ANION GAP: 5 (ref 5–15)
BUN: 16 mg/dL (ref 6–20)
CALCIUM: 8.8 mg/dL — AB (ref 8.9–10.3)
CO2: 25 mmol/L (ref 22–32)
CREATININE: 0.98 mg/dL (ref 0.61–1.24)
Chloride: 110 mmol/L (ref 101–111)
GFR calc non Af Amer: >60 mL/min (ref >60)
Glucose, Bld: 93 mg/dL (ref 65–99)
Potassium: 4.2 mmol/L (ref 3.5–5.1)
SODIUM: 140 mmol/L (ref 135–145)

## 2016-01-12 LAB — MAGNESIUM: MAGNESIUM: 1.8 mg/dL (ref 1.7–2.4)

## 2016-01-12 LAB — T3: T3, Total: 117 ng/dL (ref 71–180)

## 2016-01-12 MED ORDER — DILTIAZEM HCL ER COATED BEADS 240 MG PO CP24: 240.0000 mg | ORAL_CAPSULE | Freq: Every day | ORAL | Status: DC

## 2016-01-12 NOTE — Discharge Instructions (Signed)
You are scheduled for a stress test, this is a two part exam that will be done March 8th and March 9th.  You will receive a call from the office a few days ahead of time with pre-test instructions.

## 2016-01-12 NOTE — Plan of Care (Signed)
Problem: Education: Goal: Understanding of medication regimen will improve Outcome: Completed/Met Date Met:  01/12/16 Discussed medications and given additional information on medications

## 2016-01-12 NOTE — Progress Notes (Signed)
SUBJECTIVE: The patient is doing well today.  At this time, he denies chest pain, shortness of breath, or any new concerns.  Wants to go home  . aspirin EC  81 mg Oral Daily  . atorvastatin  40 mg Oral QPM  . diltiazem  180 mg Oral Daily  . enoxaparin (LOVENOX) injection  40 mg Subcutaneous Daily  . levETIRAcetam  750 mg Oral BID  . levothyroxine  450 mcg Oral Q M,W,F   And  . levothyroxine  300 mcg Oral Q T,Th,S,Su  . PHENobarbital  129.6 mg Oral QHS  . PHENobarbital  32.4 mg Oral Daily  . sodium chloride  1,000 mL Intravenous Once  . sodium chloride flush  3 mL Intravenous Q12H      OBJECTIVE: Physical Exam:    Filed Vitals:   01/11/16 1500 01/11/16 1635 01/11/16 2100 01/12/16 0500  BP: 108/67 129/74 113/72 103/58  Pulse: 83  79 78  Temp: 97.9 F (36.6 C) 97.5 F (36.4 C) 97.5 F (36.4 C) 97.7 F (36.5 C)  TempSrc: Oral Oral    Resp: 19 20 20 13   Height:      Weight:    279 lb (126.554 kg)  SpO2: 94% 97% 98% 93%    Intake/Output Summary (Last 24 hours) at 01/12/16 [id number] Last data filed at 01/12/16 0500  Gross per 24 hour  Intake   1395 ml  Output   1050 ml  Net    345 ml    Telemetry reveals sinus rhythm  GEN- The patient is well appearing, alert and oriented x 3 today.   Head- normocephalic, atraumatic Eyes-  Sclera clear, conjunctiva pink Ears- hearing intact Oropharynx- clear Neck- supple,   Lungs- Clear to ausculation bilaterally, normal work of breathing Heart- Regular rate and rhythm, no murmurs, rubs or gallops, PMI not laterally displaced GI- soft, NT, ND, + BS Extremities- no clubbing, cyanosis, or edema Skin- no rash or lesion Psych- euthymic mood, full affect Neuro- strength and sensation are intact  LABS: Basic Metabolic Panel:  Recent Labs  01/11/16 0110 01/12/16 0618  NA 142 140  K 4.1 4.2  CL 110 110  CO2 20* 25  GLUCOSE 96 93  BUN 22* 16  CREATININE 1.26* 0.98  CALCIUM 8.9 8.8*  MG 1.6* 1.8   Liver Function Tests: No  results for input(s): AST, ALT, ALKPHOS, BILITOT, PROT, ALBUMIN in the last 72 hours. No results for input(s): LIPASE, AMYLASE in the last 72 hours. CBC:  Recent Labs  01/11/16 0110  WBC 7.4  NEUTROABS 4.9  HGB 12.4*  HCT 39.1  MCV 94.9  PLT 191   Cardiac Enzymes:  Recent Labs  01/11/16 0728 01/11/16 1240 01/11/16 1931  TROPONINI 0.07* 0.05* 0.04*   BNP: Invalid input(s): POCBNP D-Dimer:  Recent Labs  01/11/16 0400  DDIMER 1.31*   Hemoglobin A1C: No results for input(s): HGBA1C in the last 72 hours. Fasting Lipid Panel: No results for input(s): CHOL, HDL, LDLCALC, TRIG, CHOLHDL, LDLDIRECT in the last 72 hours. Thyroid Function Tests:  Recent Labs  01/11/16 0400  TSH 0.043*    ASSESSMENT AND PLAN:   1. Wide complex tachycardia The patients initial tachycardia was a wide complex rhythm. Unfortunately, we do not have a 12 lead of this. I cannot therefore determine whether this was SVT or VT. Interestingly, the cycle length (225 msec) is identical to subsequent atrial flutter cycle length. It is therefore possible that this arrhythmia was atrial flutter with 1:1 conduction. As the  arrhythmia was hemodynamically stable, I am not certain that we would treat them differently. Echo is normal Will therefore discharge on diltiazem CD 240mg  daily (an increase from current dose)  2. Afib/ atrial flutter Clearly documented and likely primary arrhythmia chads2vasc score is 1. would not anticoagulate at this time.  If arrhythmia burden increases, may warrant anticoagulation in the future  3. HTN Stable No change required today  4. Chest pain Likely due to tachycardia CMs without elevation Echo reveals normal LV wall motion Would order outpatient lexiscan myoview (unable to walk on treadmill)  DC to home today Follow-up with EP PA in 3 weeks  Thompson Grayer, MD 01/12/2016 8:07 AM

## 2016-01-12 NOTE — Discharge Summary (Signed)
ELECTROPHYSIOLOGY PROCEDURE DISCHARGE SUMMARY    Patient ID: [name],  MRN: [id number], DOB/AGE: 1951-11-15 65 y.o.  Admit date: 01/11/2016 Discharge date: 01/12/2016  Primary Care Physician:  Melinda Crutch, MD Primary Cardiologist/Electrophysiologist: (new) Dr. Rayann Heman  Primary Discharge Diagnosis:  1. CP 2. Wide complex tachycardia 3. New atrial Fib/Flutter  Secondary Discharge Diagnosis:  1. HTN 2. Seizure d/o  Allergies  Allergen Reactions  . Depakote [Divalproex Sodium]     Intolerant  . Latex Rash    Hands rash and  mild hives       Brief HPI: [name] is a 65 y.o. male was admitted to [facility] with c/o CP, noted to have WCT by EMS, that resolved upon insertion of his IV, this also resolved his symptom of CP.  He was admitted for further evaluation  Hospital Course:  [name] is a 65 y.o. maleadmitted 01/11/16 with HTN, Seizure disorder, OSA, & cervical myelopthy who presented with wide complex tachycardia. He reports having burning chest pain. He has never had a similar pain before. He is unaware of any prior tachyarrhythmias.  He was seen by cardiology/EP service, Dr. Rayann Heman, who noted unfortunately,there was not a 12 lead of the [facility] and could not therefore determine whether this was SVT or VT. Interestingly, the cycle length (225 msec) is identical to subsequent atrial flutter cycle length and felt is therefore possible that this arrhythmia was atrial flutter with 1:1 conduction. As the arrhythmia was hemodynamically stable, I am not certain that we would treat them differently.  His echocardiogram was with normal LVEF , hs cardiac markers were negative for ACS, and felt his CP likely secondary to the tachycardia, was decided to treat him with Diltiazem and follow up out patient.  His D dimer was elevated with negative CT chest for PE, his CXR felt not to be pneumonia.  He has seizure disorder and gait disturbance, it was felt at this time,  to hold off on full anticoagulation with CHA2DS2Vasc score of one.  We have arranged for out patient lexiscan myoview and follow up.  He was observed on telemetry without recurrent arrhythmia or symptoms. His BP has been stable on the diltiazem, he has not been on his home lisinopril or metoprolol here, and will continue off, he has chronic edema and will continue his home HCTZ, no other changes are being made to his home regime.  The patient was examined by Dr. Rayann Heman and considered stable for discharge to home.     Physical Exam: Filed Vitals:   01/11/16 1500 01/11/16 1635 01/11/16 2100 01/12/16 0500  BP: 108/67 129/74 113/72 103/58  Pulse: 83  79 78  Temp: 97.9 F (36.6 C) 97.5 F (36.4 C) 97.5 F (36.4 C) 97.7 F (36.5 C)  TempSrc: Oral Oral    Resp: 19 20 20 13   Height:      Weight:    279 lb (126.554 kg)  SpO2: 94% 97% 98% 93%    Labs:   Lab Results  Component Value Date   WBC 7.4 01/11/2016   HGB 12.4* 01/11/2016   HCT 39.1 01/11/2016   MCV 94.9 01/11/2016   PLT 191 01/11/2016     Recent Labs Lab 01/12/16 0618  NA 140  K 4.2  CL 110  CO2 25  BUN 16  CREATININE 0.98  CALCIUM 8.8*  GLUCOSE 93    Discharge Medications:    Medication List    STOP taking these medications  benazepril 20 MG tablet  Commonly known as:  LOTENSIN     metoprolol tartrate 25 MG tablet  Commonly known as:  LOPRESSOR      TAKE these medications        aspirin EC 81 MG tablet  Take 81 mg by mouth daily.     atorvastatin 40 MG tablet  Commonly known as:  LIPITOR  Take 40 mg by mouth every evening.     diltiazem 240 MG 24 hr capsule  Commonly known as:  CARDIZEM CD  Take 1 capsule (240 mg total) by mouth daily.     hydrochlorothiazide 25 MG tablet  Commonly known as:  HYDRODIURIL  Take 1 tablet (25 mg total) by mouth daily.     levETIRAcetam 750 MG tablet  Commonly known as:  KEPPRA  TAKE 1 TABLET (750 MG TOTAL) BY MOUTH 2 (TWO) TIMES DAILY.      levothyroxine 150 MCG tablet  Commonly known as:  SYNTHROID, LEVOTHROID  Take 300-450 mcg by mouth daily before breakfast. Pt takes 450mg  on MWF - pt takes 300mg  all other days     multivitamin with minerals Tabs tablet  Take 1 tablet by mouth every morning.     PHENobarbital 32.4 MG tablet  Commonly known as:  LUMINAL  Take 1 tablet (32.4 mg total) by mouth daily.     PHENobarbital 64.8 MG tablet  Commonly known as:  LUMINAL  Take 2 tablets (129.6 mg total) by mouth at bedtime.        Disposition:   Follow-up Information    Follow up with [facility] On 01/28/2016.   Specialty:  Cardiology   Why:  12:45PM part one of stress test   Contact information:   [address], [place] [phone]      Follow up with [facility] On 01/29/2016.   Specialty:  Cardiology   Why:  12:45PM, part two of stress test   Contact information:   [address] [place] [phone]      Follow up with [facility] [place], PA-C On 02/11/2016.   Specialty:  Cardiology   Why:  10:00AM   Contact information:   [address] [place] [address] [phone]       Duration of Discharge Encounter: Greater than 30 minutes including physician time.  Venetia Night, PA-C 01/12/2016 10:10 AM   Thompson Grayer MD, [facility] 01/13/2016 6:16 PM

## 2016-01-12 NOTE — Progress Notes (Signed)
Patient given discharge instructions, discharged home with follow up testing and appointments. No questions at this time.

## 2016-01-12 NOTE — Progress Notes (Signed)
UR Completed Danicka Hourihan Graves-Bigelow, RN,BSN [phone]  

## 2016-01-26 ENCOUNTER — Telehealth (HOSPITAL_COMMUNITY): Payer: Self-pay | Admitting: *Deleted

## 2016-01-26 NOTE — Telephone Encounter (Signed)
Left message on voicemail in reference to upcoming appointment scheduled for 01/28/16. Phone number given for a call back so details instructions can be given. Hubbard Robinson, RN

## 2016-01-27 ENCOUNTER — Telehealth (HOSPITAL_COMMUNITY): Payer: Self-pay | Admitting: *Deleted

## 2016-01-27 NOTE — Telephone Encounter (Signed)
Patient given detailed instructions per Myocardial Perfusion Study Information Sheet for the test on 01/29/16 at 12:45. Patient notified to arrive 15 minutes early and that it is imperative to arrive on time for appointment to keep from having the test rescheduled.  If you need to cancel or reschedule your appointment, please call the office within 24 hours of your appointment. Failure to do so may result in a cancellation of your appointment, and a $50 no show fee. Patient verbalized understanding.[name]

## 2016-01-28 ENCOUNTER — Ambulatory Visit (HOSPITAL_COMMUNITY): Payer: Medicare Other | Attending: Cardiology

## 2016-01-28 DIAGNOSIS — R079 Chest pain, unspecified: Secondary | ICD-10-CM | POA: Insufficient documentation

## 2016-01-28 DIAGNOSIS — I10 Essential (primary) hypertension: Secondary | ICD-10-CM | POA: Insufficient documentation

## 2016-01-28 MED ORDER — TECHNETIUM TC 99M SESTAMIBI GENERIC - CARDIOLITE
33.0000 | Freq: Once | INTRAVENOUS | Status: AC | PRN
  Administered 2016-01-28: 33 via INTRAVENOUS

## 2016-01-28 MED ORDER — REGADENOSON 0.4 MG/5ML IV SOLN
0.4000 mg | Freq: Once | INTRAVENOUS | Status: AC
  Administered 2016-01-28: 0.4 mg via INTRAVENOUS

## 2016-01-29 ENCOUNTER — Ambulatory Visit (HOSPITAL_COMMUNITY): Payer: Medicare Other | Attending: Cardiovascular Disease

## 2016-01-29 LAB — MYOCARDIAL PERFUSION IMAGING
CHL CUP NUCLEAR SDS: 2
CHL CUP NUCLEAR SRS: 9
CSEPPHR: 123 {beats}/min
LV sys vol: 35 mL
LVDIAVOL: 95 mL (ref 62–150)
RATE: 0.27
Rest HR: 103 {beats}/min
SSS: 8
TID: 0.73

## 2016-01-29 MED ORDER — TECHNETIUM TC 99M SESTAMIBI GENERIC - CARDIOLITE
33.0000 | Freq: Once | INTRAVENOUS | Status: AC | PRN
  Administered 2016-01-29: 33 via INTRAVENOUS

## 2016-02-02 ENCOUNTER — Telehealth: Payer: Self-pay | Admitting: *Deleted

## 2016-02-02 ENCOUNTER — Telehealth: Payer: Self-pay | Admitting: Physician Assistant

## 2016-02-02 NOTE — Telephone Encounter (Signed)
-----   Message from [facility], [place] sent at 01/30/2016 12:13 PM EST ----- Please let the patient know his stress test result looks good, remind him of his appointment with me later this month.  Thanks [employer]

## 2016-02-02 NOTE — Telephone Encounter (Signed)
Follow Up:  Returning your call,concerning his stress test results.

## 2016-02-02 NOTE — Telephone Encounter (Signed)
Spoke to pt

## 2016-02-03 ENCOUNTER — Telehealth: Payer: Self-pay | Admitting: Physician Assistant

## 2016-02-03 NOTE — Telephone Encounter (Signed)
Informed pt of stress test results. Pt verbalized understanding. 

## 2016-02-03 NOTE — Telephone Encounter (Signed)
New Message:  Pt wants test results from last week please.

## 2016-02-03 NOTE — Telephone Encounter (Signed)
Left message to call back  

## 2016-02-03 NOTE — Telephone Encounter (Signed)
F/U --[name] is returning a call

## 2016-02-03 NOTE — Telephone Encounter (Signed)
Returned call to patient no answer.LMTC. 

## 2016-02-04 ENCOUNTER — Telehealth: Payer: Self-pay | Admitting: Internal Medicine

## 2016-02-04 NOTE — Telephone Encounter (Signed)
New message      Pt was seen in the hosp 01-11-16.  He is having a toenail removed due to fungus and want to make sure it is ok to due because he has a lot of swelling in his feet/ankles.  The doctors office did not need clearance, pt is just wondering if it is ok.

## 2016-02-04 NOTE — Telephone Encounter (Signed)
Spoke with the patient and he has just gotten off the phone with the PCP and they have okayed for him to hold on having this done until his f/u appointment 3/22.  He is afraid of healing due to poor circulation. He was given an anti-fungal by Dr Debbra Riding to take--Cephalxix 500mg  ---to take 2 tablets twice daily.  He has finished this and has called in to the MD to have this refilled.  He will have the office call us if and when he decides to proceed with having the toenail removed if they need clearance.

## 2016-02-10 NOTE — Progress Notes (Signed)
Cardiology Office Note Date:  02/11/2016  Patient ID:  [name], [name] 1950/12/23, MRN [id number] PCP:   Melinda Crutch, MD  Cardiologist/Electrophysiologist: Dr. Rayann Heman    Chief Complaint:  Post hospital visit, stress test result  History of Present Illness: [name] is a 65 y.o. male with history of new PAFib/flutter found at his hospital stay in Feb 2017, [place], HTN, seizure d/o.  He was admitted to [facility] 01/11/16 with c/o CP, noted to have WCT by EMS, that resolved upon insertion of his IV, this also resolved his symptom of CP, Dr. Rayann Heman, who noted unfortunately,there was not a 12 lead of the [facility] and could not therefore determine whether this was SVT or VT. Interestingly, the cycle length (225 msec) is identical to subsequent atrial flutter cycle length and felt is therefore possible that this arrhythmia was atrial flutter with 1:1 conduction. As the arrhythmia was hemodynamically stable, I am not certain that we would treat them differently. His echocardiogram was with normal LVEF , hs cardiac markers were negative for ACS, and felt his CP likely secondary to the tachycardia, was decided to treat him with Diltiazem and follow up out patient. His D dimer was elevated with negative CT chest for PE, his CXR felt not to be pneumonia. He has seizure disorder and gait disturbance, it was felt at this time, to hold off on full anticoagulation with CHA2DS2Vasc score of one.  He comes today being seen for Dr. Rayann Heman and feeling well.  He has not had any kind of recurrent CP or palpitations, no SOB, no dizziness, near syncope or syncope.  He has been a couple years without his CPAP machine reports it borke and never was replaced, has not had this evaluated for a few years.    He is seeing a podiatrist and may need L great toenail removed, this being his only complaint today.  He states he has been checked and never found to have DM. He has chronic swelling b/l LE for years, he wears  orthopedic shoes for an unclear problem with his feet, and walks with the aid of cane secondary to chronic back problems and some degree of gait instability.      Past Medical History  Diagnosis Date  . Hypertension   . High cholesterol   . Hypothyroidism   . Seizure disorder ([place])   . Sleep apnea, obstructive     uses cpap machine .9  . Sleep apnea, obstructive   . Kidney stone     hx of  . Cancer ([place])     skin ca to scalp, sees Dr. Tonia Brooms  . Arthritis     left knee  . Cataract     bilateral  . Rash, skin     to left lower leg   . Asthma     "outgrew it from place I used to work at"  . Seizures ([place]) 1966    "started as gran mal; lessened to no loss of consciousness nowl last ~ 2000" patient sees Dr. Floyde Parkins  . Peripheral edema   . Gait disorder   . Memory loss   . Cervical myelopathy ([place])   . Cervical spondylosis with myelopathy 08/17/2013  . Abnormality of gait 08/17/2013    Past Surgical History  Procedure Laterality Date  . Cervical discectomy  03/2011  . Eye surgery  [phone]    "right eye; 4 times total; for detached retina"  . Cataract extraction w/ intraocular lens  implant, bilateral  1990's  . Retinal detachment surgery      x 4 right eye  . Anterior cervical decomp/discectomy fusion  02/21/2012    Procedure: ANTERIOR CERVICAL DECOMPRESSION/DISCECTOMY FUSION 1 LEVEL;  Surgeon: Hosie Spangle, MD;  Location: [place] [facility];  Service: Neurosurgery;  Laterality: N/A;  Cervical Five-Six anterior cervical decompression with fusion plating and bonegraft    Current Outpatient Prescriptions  Medication Sig Dispense Refill  . aspirin EC 81 MG tablet Take 81 mg by mouth daily.      Marland Kitchen atorvastatin (LIPITOR) 40 MG tablet Take 40 mg by mouth every evening.      . diltiazem (CARDIZEM CD) 240 MG 24 hr capsule Take 1 capsule (240 mg total) by mouth daily. 30 capsule 3  . hydrochlorothiazide (HYDRODIURIL) 25 MG tablet Take 1 tablet (25 mg total) by mouth daily.  (Patient taking differently: Take 12.5 mg by mouth daily. )    . levETIRAcetam (KEPPRA) 750 MG tablet TAKE 1 TABLET (750 MG TOTAL) BY MOUTH 2 (TWO) TIMES DAILY. 180 tablet 3  . levothyroxine (SYNTHROID, LEVOTHROID) 150 MCG tablet Take 300-450 mcg by mouth daily before breakfast. Pt takes 450mg  on MWF - pt takes 300mg  all other days    . Multiple Vitamin (MULITIVITAMIN WITH MINERALS) TABS Take 1 tablet by mouth every morning.     Marland Kitchen PHENobarbital (LUMINAL) 32.4 MG tablet Take 1 tablet (32.4 mg total) by mouth daily. 30 tablet 5  . PHENobarbital (LUMINAL) 64.8 MG tablet Take 2 tablets (129.6 mg total) by mouth at bedtime. 60 tablet 5  . [DISCONTINUED] furosemide (LASIX) 40 MG tablet Take 1 tablet (40 mg total) by mouth daily. 30 tablet   . [DISCONTINUED] phenytoin (DILANTIN) 100 MG ER capsule Take 1 capsule (100 mg total) by mouth 2 (two) times daily.    . [DISCONTINUED] potassium chloride (K-DUR) 10 MEQ tablet Take 1 tablet (10 mEq total) by mouth daily.     No current facility-administered medications for this visit.    Allergies:   Depakote and Latex   Social History:  The patient  reports that he has never smoked. He has never used smokeless tobacco. He reports that he does not drink alcohol or use illicit drugs.   Family History:  The patient's family history includes Glaucoma in his father; Heart disease (age of onset: 11) in his mother; Hypertension in his father and mother.  ROS:  Please see the history of present illness.  All other systems are reviewed and otherwise negative.   PHYSICAL EXAM:  VS:  BP 122/82 mmHg  Pulse 93  Ht 6\' 1"  (1.854 m)  Wt 265 lb (120.203 kg)  BMI 34.97 kg/m2 BMI: Body mass index is 34.97 kg/(m^2). Well nourished, obese, tall, well developed, in no acute distress HEENT: normocephalic, atraumatic Neck: no JVD, carotid bruits or masses Cardiac:  normal S1, S2; RRR; no significant murmurs, no rubs, or gallops Lungs:  clear to auscultation bilaterally, no  wheezing, rhonchi or rales Abd: soft, nontender MS: no deformity or atrophy Ext: no pitting edema Skin: warm and dry, no rash Neuro:  No gross deficits appreciated Psych: euthymic mood, full affect   EKG:  Done today shows SR, 87bpm, T changes III, aVF similar to previous  01/28/16: Lexiscan stress test Study Highlights     Nuclear stress EF: 63%.  There was no ST segment deviation noted during stress.  No T wave inversion was noted during stress.  Defect 1: There is a small defect present in the apex  location.  The study is normal.  This is a low risk study.  The left ventricular ejection fraction is normal (55-65%).   There is a small defect present in the apex location. The defect is consistent with artifact.    Overall Study Impression Myocardial perfusion is normal. The study is normal. This is a low risk study. Overall left ventricular systolic function was normal. LV cavity size is normal. Nuclear stress EF: 63%. The left ventricular ejection fraction is normal (55-65%). There is no prior study for comparison.       01/11/16: Echocardiogram Study Conclusions  Left ventricle: The cavity size was normal. Wall thickness was  increased in a pattern of mild LVH. Systolic function was normal.  The estimated ejection fraction was in the range of 60% to 65%.  Wall motion was normal; there were no regional wall motion  abnormalities    Recent Labs: 09/04/2015: ALT 17 01/11/2016: Hemoglobin 12.4*; Platelets 191; TSH 0.043* 01/12/2016: BUN 16; Creatinine, Ser 0.98; Magnesium 1.8; Potassium 4.2; Sodium 140  No results found for requested labs within last 365 days.   CrCl cannot be calculated (Patient has no serum creatinine result on file.).   Wt Readings from Last 3 Encounters:  02/11/16 265 lb (120.203 kg)  01/28/16 286 lb (129.729 kg)  01/12/16 279 lb (126.554 kg)     Other studies reviewed: Additional studies/records reviewed today include: summarized  above  ASSESSMENT AND PLAN:  1. New AFib/flutter     CHA2DS2Vasc is one, not on a/c therapy with hx of seizure d/o,  Discussed with the patient at length today     He is in SR today      2. CP    No recurrent episodes    negative stress test  3. WCT       Suspect to be AFlutter with 1:1 conduction, no 12 lead of the WCT      nml LVEF, negative stress test for any ischemia  4. HTN     Controlled  Disposition: Will have him planned for an event monitor and see him back in 3 months sooner if needed.  Discussed the importance of treatment of sleep apnea, he will revisit this with his PMD and get re-evaluated, he cannot recall the name of the pulmonologist he used to see.  Current medicines are reviewed at length with the patient today.  The patient did not have any concerns regarding medicines.  Haywood Lasso, PA-C 02/11/2016 10:32 AM     [facility] [place]  [phone] (office)  [phone] (fax)

## 2016-02-11 ENCOUNTER — Encounter: Payer: Self-pay | Admitting: Physician Assistant

## 2016-02-11 ENCOUNTER — Ambulatory Visit (INDEPENDENT_AMBULATORY_CARE_PROVIDER_SITE_OTHER): Payer: Medicare Other | Admitting: Physician Assistant

## 2016-02-11 VITALS — BP 122/82 | HR 93 | Ht 73.0 in | Wt 265.0 lb

## 2016-02-11 DIAGNOSIS — I10 Essential (primary) hypertension: Secondary | ICD-10-CM

## 2016-02-11 DIAGNOSIS — I4892 Unspecified atrial flutter: Secondary | ICD-10-CM

## 2016-02-11 NOTE — Patient Instructions (Signed)
Medication Instructions:   Your physician recommends that you continue on your current medications as directed. Please refer to the Current Medication list given to you today.   If you need a refill on your cardiac medications before your next appointment, please call your pharmacy.  Labwork:  NONE ORDER TODAY    Testing/Procedures:   Your physician has recommended that you wear an event monitor. Event monitors are medical devices that record the heart's electrical activity. Doctors most often [place] these monitors to diagnose arrhythmias. Arrhythmias are problems with the speed or rhythm of the heartbeat. The monitor is a small, portable device. You can wear one while you do your normal daily activities. This is usually used to diagnose what is causing palpitations/syncope (passing out).     Follow-Up:  IN 3 MONTHS WITH RENEE URSUY    Any Other Special Instructions Will Be Listed Below (If Applicable).  FOLLOW UP WITH PRIMARY CARE DOCTOR  FOR SLEEP STUDY

## 2016-02-13 ENCOUNTER — Ambulatory Visit (INDEPENDENT_AMBULATORY_CARE_PROVIDER_SITE_OTHER): Payer: Medicare Other

## 2016-02-13 DIAGNOSIS — I4892 Unspecified atrial flutter: Secondary | ICD-10-CM | POA: Diagnosis not present

## 2016-03-03 ENCOUNTER — Telehealth: Payer: Self-pay | Admitting: Internal Medicine

## 2016-03-03 MED ORDER — DILTIAZEM HCL ER COATED BEADS 120 MG PO CP24: 120.0000 mg | ORAL_CAPSULE | Freq: Every day | ORAL | Status: DC

## 2016-03-03 NOTE — Telephone Encounter (Signed)
Spoke with patient and he is aware to increase the Diltiazem to 360 mg

## 2016-03-03 NOTE — Telephone Encounter (Signed)
New Message:  [name] is calling in to report an urgent abnormal EKG. Please f/u

## 2016-03-03 NOTE — Telephone Encounter (Signed)
Follow Up:   Pt called back and wanted to know if you called him again?

## 2016-03-03 NOTE — Telephone Encounter (Signed)
I had Katie pull strips for me.  They started last night at 6:30.  When I called and spoke with [employer], I asked if anyone had contacted the patient last night.  I was told no.   I contacted the patient and he was symptomatic around 10:30.  He says he felt a little lightheaded and dizzy.   Strips taken to Dr Curt Bears who advised the patient increase his Cardizem to 360mg  and follow up with Dr Rayann Heman upon his retun.   Patient aware

## 2016-03-13 ENCOUNTER — Other Ambulatory Visit: Payer: Self-pay | Admitting: Neurology

## 2016-03-15 NOTE — Telephone Encounter (Signed)
Faxed to pharmacy @ [phone]

## 2016-03-20 ENCOUNTER — Other Ambulatory Visit: Payer: Self-pay | Admitting: Neurology

## 2016-03-25 ENCOUNTER — Other Ambulatory Visit: Payer: Self-pay | Admitting: Neurology

## 2016-04-03 ENCOUNTER — Other Ambulatory Visit: Payer: Self-pay | Admitting: Neurology

## 2016-04-05 NOTE — Telephone Encounter (Signed)
Pt called said he will be out of medication on 04/07/16. Operator advised pt RX was sent 03/15/16 with 5 refills and to [facility] [address] and relay message. Pt will call back if there are any discrepancies.

## 2016-04-05 NOTE — Telephone Encounter (Signed)
Pt called back. He is really confused about this RX. Will you please call pharmacy and confirm that he has new RX with refills. Please call the patient  Pt also said he had some heart problems on 02/13/16. He has new medication:  diltiazem (CARDIZEM CD) 120 MG 24 hr capsule and diltiazem (CARDIZEM CD) 240 MG 24 hr capsule . FYI

## 2016-04-07 ENCOUNTER — Other Ambulatory Visit: Payer: Self-pay

## 2016-04-07 ENCOUNTER — Other Ambulatory Visit: Payer: Self-pay | Admitting: Neurology

## 2016-04-07 ENCOUNTER — Other Ambulatory Visit: Payer: Self-pay | Admitting: *Deleted

## 2016-04-07 MED ORDER — PHENOBARBITAL 32.4 MG PO TABS: 32.4000 mg | ORAL_TABLET | Freq: Every day | ORAL | Status: DC

## 2016-04-07 NOTE — Telephone Encounter (Addendum)
Pt has called back. The RX has expired for phenobarbital 32.4 as of 03/02/16. He needs a new RX. This patient needs to be contacted to discuss if he can have RX or not. He can be reached [phone]

## 2016-04-07 NOTE — Telephone Encounter (Signed)
Karis Conigliaro [id number]  [facility]AME [facility]  [phone] * [phone]  [facility]/LAST DOSE IS 5/18/PCB

## 2016-04-07 NOTE — Telephone Encounter (Signed)
Rx for 64.8 mg (2 tabs) at bedtime sent in on 03/15/16. Pt is requesting new rx for 32.4 mg tabs. 1 yr f/u is scheduled 08/2017.

## 2016-04-07 NOTE — Telephone Encounter (Signed)
Pt called back, said he still does not have RX for

## 2016-04-08 ENCOUNTER — Other Ambulatory Visit: Payer: Self-pay

## 2016-04-08 NOTE — Telephone Encounter (Signed)
Rx printed, signed, faxed to pharmacy. 

## 2016-04-09 ENCOUNTER — Encounter: Payer: Self-pay | Admitting: *Deleted

## 2016-04-12 ENCOUNTER — Telehealth: Payer: Self-pay | Admitting: Neurology

## 2016-04-12 NOTE — Telephone Encounter (Signed)
Pt called to advise he is changing pharmacy to [facility] [address].  FYI

## 2016-04-12 NOTE — Telephone Encounter (Signed)
Pharmacy updated.

## 2016-04-13 ENCOUNTER — Other Ambulatory Visit: Payer: Self-pay

## 2016-04-13 MED ORDER — DILTIAZEM HCL ER COATED BEADS 240 MG PO CP24: 240.0000 mg | ORAL_CAPSULE | Freq: Every day | ORAL | Status: DC

## 2016-04-14 ENCOUNTER — Telehealth: Payer: Self-pay | Admitting: *Deleted

## 2016-04-14 ENCOUNTER — Ambulatory Visit (INDEPENDENT_AMBULATORY_CARE_PROVIDER_SITE_OTHER): Payer: Medicare Other | Admitting: Internal Medicine

## 2016-04-14 ENCOUNTER — Encounter: Payer: Self-pay | Admitting: Internal Medicine

## 2016-04-14 VITALS — BP 128/74 | HR 73 | Ht 72.0 in | Wt 278.8 lb

## 2016-04-14 DIAGNOSIS — I4891 Unspecified atrial fibrillation: Secondary | ICD-10-CM | POA: Insufficient documentation

## 2016-04-14 DIAGNOSIS — I480 Paroxysmal atrial fibrillation: Secondary | ICD-10-CM | POA: Diagnosis not present

## 2016-04-14 DIAGNOSIS — I10 Essential (primary) hypertension: Secondary | ICD-10-CM | POA: Diagnosis not present

## 2016-04-14 DIAGNOSIS — I4892 Unspecified atrial flutter: Secondary | ICD-10-CM

## 2016-04-14 MED ORDER — DILTIAZEM HCL ER BEADS 360 MG PO CP24: 360.0000 mg | ORAL_CAPSULE | Freq: Every day | ORAL | Status: DC

## 2016-04-14 NOTE — Patient Instructions (Signed)
Medication Instructions:  Your physician recommends that you continue on your current medications as directed. Please refer to the Current Medication list given to you today.   Labwork: None ordered   Testing/Procedures: None ordered   Follow-Up: Your physician wants you to follow-up in: 6 months with Renee Urusy, PA You will receive a reminder letter in the mail two months in advance. If you don't receive a letter, please call our office to schedule the follow-up appointment.   Any Other Special Instructions Will Be Listed Below (If Applicable).     If you need a refill on your cardiac medications before your next appointment, please call your pharmacy.   

## 2016-04-14 NOTE — Progress Notes (Signed)
PCP:  Melinda Crutch, MD   [name] is a 65 y.o. male who presents today for electrophysiology followup.  Since I saw him in the hospital, the patient reports doing very well.  He has rare palpitations.  Recent event monitor revealed afib as well as atrial flutter with RVR.  Diltiazem was increased to 360mg  daily.  He has done well since that time.  Today, he denies symptoms of palpitations, chest pain, shortness of breath, dizziness, presyncope, or syncope.  + chronic edema.  The patient is otherwise without complaint today.   Past Medical History  Diagnosis Date  . Hypertension   . High cholesterol   . Hypothyroidism   . Sleep apnea, obstructive     uses cpap machine .9  . Kidney stone     hx of  . Cancer ([place])     skin ca to scalp, sees Dr. Tonia Brooms  . Arthritis     left knee  . Cataract     bilateral  . Rash, skin     to left lower leg   . Asthma     "outgrew it from place I used to work at"  . Seizures ([place]) 1966    "started as gran mal; lessened to no loss of consciousness nowl last ~ 2000" patient sees Dr. Floyde Parkins  . Peripheral edema   . Gait disorder   . Memory loss   . Cervical myelopathy ([place])   . Cervical spondylosis with myelopathy 08/17/2013  . Abnormality of gait 08/17/2013  . Paroxysmal atrial fibrillation ([facility])     chads2vasc score is 1  . Atrial flutter ([place])     with rapid conduction   Past Surgical History  Procedure Laterality Date  . Cervical discectomy  03/2011  . Eye surgery  [phone]    "right eye; 4 times total; for detached retina"  . Cataract extraction w/ intraocular lens  implant, bilateral  1990's  . Retinal detachment surgery      x 4 right eye  . Anterior cervical decomp/discectomy fusion  02/21/2012    Procedure: ANTERIOR CERVICAL DECOMPRESSION/DISCECTOMY FUSION 1 LEVEL;  Surgeon: Hosie Spangle, MD;  Location: [place] [facility];  Service: Neurosurgery;  Laterality: N/A;  Cervical Five-Six anterior cervical decompression with fusion  plating and bonegraft    ROS- all systems are reviewed and negatives except as per HPI above  Current Outpatient Prescriptions  Medication Sig Dispense Refill  . aspirin EC 81 MG tablet Take 81 mg by mouth daily.      Marland Kitchen atorvastatin (LIPITOR) 40 MG tablet Take 40 mg by mouth every evening.      . diltiazem (CARDIZEM CD) 120 MG 24 hr capsule Take 1 capsule (120 mg total) by mouth daily. 90 capsule 3  . diltiazem (CARDIZEM CD) 240 MG 24 hr capsule Take 1 capsule (240 mg total) by mouth daily. 30 capsule 1  . hydrochlorothiazide (HYDRODIURIL) 25 MG tablet Take 25 mg by mouth daily. Take one-half tablet (12.5mg ) daily.    Marland Kitchen levETIRAcetam (KEPPRA) 750 MG tablet TAKE 1 TABLET (750 MG TOTAL) BY MOUTH 2 (TWO) TIMES DAILY. 180 tablet 3  . levothyroxine (SYNTHROID, LEVOTHROID) 150 MCG tablet Take 300-450 mcg by mouth daily before breakfast. Pt takes 450mg  on MWF - pt takes 300mg  all other days    . Multiple Vitamin (MULITIVITAMIN WITH MINERALS) TABS Take 1 tablet by mouth every morning.     Marland Kitchen PHENobarbital (LUMINAL) 32.4 MG tablet Take 1 tablet (32.4 mg total)  by mouth daily. 90 tablet 3  . PHENobarbital (LUMINAL) 64.8 MG tablet TAKE 2 TABLETS BY MOUTH AT BEDTIME 60 tablet 5  . [DISCONTINUED] furosemide (LASIX) 40 MG tablet Take 1 tablet (40 mg total) by mouth daily. 30 tablet   . [DISCONTINUED] phenytoin (DILANTIN) 100 MG ER capsule Take 1 capsule (100 mg total) by mouth 2 (two) times daily.    . [DISCONTINUED] potassium chloride (K-DUR) 10 MEQ tablet Take 1 tablet (10 mEq total) by mouth daily.     No current facility-administered medications for this visit.    Physical Exam: Filed Vitals:   04/14/16 1346  BP: 128/74  Pulse: 73  Height: 6' (1.829 m)  Weight: 278 lb 12.8 oz (126.463 kg)    GEN- The patient is well appearing, alert and oriented x 3 today.   Head- normocephalic, atraumatic Eyes-  Sclera clear, conjunctiva pink Ears- hearing intact Oropharynx- clear Lungs- Clear to  ausculation bilaterally, normal work of breathing Heart- Regular rate and rhythm, no murmurs, rubs or gallops, PMI not laterally displaced GI- soft, NT, ND, + BS Extremities- no clubbing, cyanosis, or edema  ekg today reveals sinus rhythm 76 bpm, nonspecific ST/T changes  Assessment and Plan:  1. Atrial fibrillation/ atrial flutter Doing well with rate control at this time chads2vasc score is 1.  Given seizure disorder and gait instability, would not recommend anticoagulation presently I suspect that prior admit for WCT was due to atrial flutter with RVR.  2. HTN Stable No change required today  Return to see EP PA in 6 months and then to follow every 6 months thereafter I will see when needed  Thompson Grayer MD, [facility] 04/14/2016 2:07 PM

## 2016-04-14 NOTE — Telephone Encounter (Signed)
-----   Message from [facility], [place] sent at 04/12/2016  6:44 AM EDT ----- Please let the patient know the result of his monitor shown nothing new from what we had seen during his hoapital stay, remind him of his visit coming up with Dr. Rayann Heman to discuss further.  Thanks, [employer]

## 2016-04-15 ENCOUNTER — Telehealth: Payer: Self-pay | Admitting: Neurology

## 2016-04-15 NOTE — Telephone Encounter (Signed)
Pt called in and states he has added a new medication diltiazem (TIAZAC) 360 MG 24 hr capsule. Pt also has about some other medication . He also changed pharmacies: [facility] on [address]. Please call pt to discuss

## 2016-04-16 NOTE — Telephone Encounter (Signed)
Returned pt TC. When inquiring about med questions, pt said that he just wanted to let us know that he changed pharmacies. Pharmacy was changed previously to [facility] on [address]. Verified w/ pt.

## 2016-04-20 MED ORDER — PHENOBARBITAL 32.4 MG PO TABS: 32.4000 mg | ORAL_TABLET | Freq: Every day | ORAL | Status: DC

## 2016-04-20 MED ORDER — LEVETIRACETAM 750 MG PO TABS: ORAL_TABLET | ORAL | Status: DC

## 2016-04-20 MED ORDER — PHENOBARBITAL 64.8 MG PO TABS: 129.6000 mg | ORAL_TABLET | Freq: Every day | ORAL | Status: DC

## 2016-04-20 NOTE — Telephone Encounter (Signed)
Message For: OFC                  Taken 30-MAY-17 at 10:56AM by TRL ------------------------------------------------------------  [name]             CID  [id number]   Patient  SAME                  Pt's Dr  Jannifer Franklin        Area Code  336  Phone#  [phone]  *  DOB  [birth year]     RE  CHECKING  STATUS OF R/X                                                                                 Disp:Y/N  N  If Y = C/B If No Response In 66minutes  ============================================================

## 2016-04-20 NOTE — Telephone Encounter (Signed)
Luminal RXs printed, signed, faxed to pharmacy. Pt notified via TC.

## 2016-04-20 NOTE — Telephone Encounter (Signed)
Seizure meds re-ordered to retail to pt's new pharmacy.

## 2016-04-20 NOTE — Addendum Note (Signed)
Addended by: Monte Fantasia on: 04/20/2016 01:43 PM   Modules accepted: Orders

## 2016-05-13 ENCOUNTER — Ambulatory Visit: Payer: Medicare Other | Admitting: Physician Assistant

## 2016-05-21 ENCOUNTER — Telehealth: Payer: Self-pay | Admitting: Neurology

## 2016-05-21 NOTE — Telephone Encounter (Signed)
[facility] called and states PHENobarbital (LUMINAL) 64.8 MG tablet will be made by a different manufacturer. Pt will need approval due to the manufacturer company and insurance. May call [phone]/Jaymie

## 2016-05-24 NOTE — Telephone Encounter (Signed)
Returned call and spoke to pharmacist who states that issue was handled and pt was able to get med refilled w/o difficulty.

## 2016-09-09 ENCOUNTER — Encounter: Payer: Self-pay | Admitting: Adult Health

## 2016-09-09 ENCOUNTER — Ambulatory Visit (INDEPENDENT_AMBULATORY_CARE_PROVIDER_SITE_OTHER): Payer: Medicare Other | Admitting: Adult Health

## 2016-09-09 VITALS — BP 128/85 | HR 95 | Ht 72.0 in | Wt 270.8 lb

## 2016-09-09 DIAGNOSIS — R569 Unspecified convulsions: Secondary | ICD-10-CM

## 2016-09-09 DIAGNOSIS — Z5181 Encounter for therapeutic drug level monitoring: Secondary | ICD-10-CM

## 2016-09-09 MED ORDER — PHENOBARBITAL 32.4 MG PO TABS: 32.4000 mg | ORAL_TABLET | Freq: Every day | ORAL | 5 refills | Status: DC

## 2016-09-09 MED ORDER — PHENOBARBITAL 64.8 MG PO TABS: 129.6000 mg | ORAL_TABLET | Freq: Every day | ORAL | 5 refills | Status: DC

## 2016-09-09 MED ORDER — LEVETIRACETAM 750 MG PO TABS: ORAL_TABLET | ORAL | 3 refills | Status: DC

## 2016-09-09 NOTE — Progress Notes (Signed)
I have read the note, and I agree with the clinical assessment and plan.  WILLIS,CHARLES KEITH   

## 2016-09-09 NOTE — Progress Notes (Signed)
PATIENT: [name] DOB: [birth year]  r REASON FOR VISIT: follow up-seizures HISTORY FROM: patient  HISTORY OF PRESENT ILLNESS: [name] is a 65 year old male with a history of seizures. He returns today for follow-up. He continues on phenobarbital and Keppra. He tolerated these medications well. Denies any seizure events. He ambulates with a cane. Denies any significant changes with his gait. He does not operate a motor vehicle. He is able to complete all ADLs independently. No changes with mood or behavior. Overall he feels that he is doing well. He returns today for an evaluation.  HISTORY [name] is a 65 year old male with a history of seizures. He returns today for follow-up. The patient continues on phenobarbital and Keppra. He is tolerating these medications well. He lives at home alone. He is able to complete all ADLs independently. He does not operate a motor vehicle. The patient reports that in July he was hospitalized. He states that he was eating hormel dinners that resulted in diarrhea and he became severely dehydrated. The patient continues to have trouble with his gait due to a cervical myelopathy. He uses a cane when ambulating. Denies any recent falls. He returns today for an evaluation.   HISTORY (WILLIS): Years Phenobarbital and tolerating it okay [name] is a 65 year old right-handed white male with a history of a chronic seizure disorder, currently on phenobarbital and Keppra. The patient has done well since last seen without recurrence of seizures. The patient does not operate a motor vehicle. 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 The patient reports some residual tingling in the hands, occasionally in the feet. The patient reports no falls since last seen, however. The patient walks with a cane when he is outside the house. The patient denies  any new medical issues that have come up since last seen. He is tolerating the medications well. He indicates that the bowels and bladder are functioning well.  REVIEW OF SYSTEMS: Out of a complete 14 system review of symptoms, the patient complains only of the following symptoms, and all other reviewed systems are negative.  See history of present illness  ALLERGIES: Allergies  Allergen Reactions  . Depakote [Divalproex Sodium]     Intolerant  . Latex Rash    Rash and mild hives    HOME MEDICATIONS: Outpatient Medications Prior to Visit  Medication Sig Dispense Refill  . aspirin EC 81 MG tablet Take 81 mg by mouth daily.      Marland Kitchen atorvastatin (LIPITOR) 40 MG tablet Take 40 mg by mouth every evening.      . diltiazem (TIAZAC) 360 MG 24 hr capsule Take 1 capsule (360 mg total) by mouth daily. 90 capsule 3  . hydrochlorothiazide (HYDRODIURIL) 25 MG tablet Take 25 mg by mouth daily. Take one-half tablet (12.5mg ) daily.    Marland Kitchen levETIRAcetam (KEPPRA) 750 MG tablet TAKE 1 TABLET (750 MG TOTAL) BY MOUTH 2 (TWO) TIMES DAILY. 180 tablet 2  . levothyroxine (SYNTHROID, LEVOTHROID) 150 MCG tablet Take 300-450 mcg by mouth daily before breakfast. Pt takes 450mg  on MWF - pt takes 300mg  all other days    . Multiple Vitamin (MULITIVITAMIN WITH MINERALS) TABS Take 1 tablet by mouth every morning.     Marland Kitchen PHENobarbital (LUMINAL) 32.4 MG tablet Take 1 tablet (32.4 mg total) by mouth daily. 90 tablet 2  . PHENobarbital (LUMINAL) 64.8 MG tablet Take 2 tablets (129.6 mg  total) by mouth at bedtime. 180 tablet 2   No facility-administered medications prior to visit.     PAST MEDICAL HISTORY: Past Medical History:  Diagnosis Date  . Abnormality of gait 08/17/2013  . Arthritis    left knee  . Asthma    "outgrew it from place I used to work at"  . Atrial flutter ([place])    with rapid conduction  . Cancer ([place])    skin ca to scalp, sees Dr. Tonia Brooms  . Cataract    bilateral  . Cervical myelopathy ([place])   .  Cervical spondylosis with myelopathy 08/17/2013  . Gait disorder   . High cholesterol   . Hypertension   . Hypothyroidism   . Kidney stone    hx of  . Memory loss   . Paroxysmal atrial fibrillation (HCC)    chads2vasc score is 1  . Peripheral edema   . Rash, skin    to left lower leg   . Seizures ([place]) 1966   "started as gran mal; lessened to no loss of consciousness nowl last ~ 2000" patient sees Dr. Floyde Parkins  . Sleep apnea, obstructive    uses cpap machine .9    PAST SURGICAL HISTORY: Past Surgical History:  Procedure Laterality Date  . ANTERIOR CERVICAL DECOMP/DISCECTOMY FUSION  02/21/2012   Procedure: ANTERIOR CERVICAL DECOMPRESSION/DISCECTOMY FUSION 1 LEVEL;  Surgeon: Hosie Spangle, MD;  Location: [facility];  Service: Neurosurgery;  Laterality: N/A;  Cervical Five-Six anterior cervical decompression with fusion plating and bonegraft  . CATARACT EXTRACTION W/ INTRAOCULAR LENS  IMPLANT, BILATERAL  1990's  . CERVICAL DISCECTOMY  03/2011  . EYE SURGERY  1996-1998   "right eye; 4 times total; for detached retina"  . RETINAL DETACHMENT SURGERY     x 4 right eye    FAMILY HISTORY: Family History  Problem Relation Age of Onset  . Hypertension Mother   . Heart disease Mother 70  . Hypertension Father   . Glaucoma Father   . Sudden death Brother     SOCIAL HISTORY: Social History   Social History  . Marital status: Married    Spouse name: N/A  . Number of children: 0  . Years of education: N/A   Occupational History  . 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 Smoking status: Never Smoker  . Smokeless tobacco: Never Used  . Alcohol use No  . Drug use: No  . Sexual activity: Not Currently   Other Topics Concern  . Not on file   Social History Narrative  . No narrative on file      PHYSICAL EXAM  Vitals:   09/09/16 1437  BP: 128/85  Pulse: 95  Weight: 270 lb 12.8 oz (122.8 kg)  Height: 6'  (1.829 m)   Body mass index is 36.73 kg/m.  Generalized: Well developed, in no acute distress, Obese   Neurological examination  Mentation: Alert oriented to time, place, history taking. Follows all commands speech and language fluent Cranial nerve II-XII: Pupils were equal round reactive to light. Extraocular movements were full, visual field were full on confrontational test. Facial sensation and strength were normal. Uvula tongue midline. Head turning and shoulder shrug  were normal and symmetric. Motor: The motor testing reveals 5 over 5 strength of all 4 extremities. Good symmetric motor tone is noted throughout.  Sensory: Sensory testing is intact to soft touch on all 4 extremities. No evidence of extinction  is noted.  Coordination: Cerebellar testing reveals good finger-nose-finger and heel-to-shin bilaterally.  Gait and station: Patient ambulating with a cane. Tandem gait not attempted. Reflexes: Deep tendon reflexes are symmetric and normal bilaterally.   DIAGNOSTIC DATA (LABS, IMAGING, TESTING) - I reviewed patient records, labs, notes, testing and imaging myself where available.  Lab Results  Component Value Date   WBC 7.4 01/11/2016   HGB 12.4 (L) 01/11/2016   HCT 39.1 01/11/2016   MCV 94.9 01/11/2016   PLT 191 01/11/2016      Component Value Date/Time   NA 140 01/12/2016 0618   NA 139 09/04/2015 1454   K 4.2 01/12/2016 0618   CL 110 01/12/2016 0618   CO2 25 01/12/2016 0618   GLUCOSE 93 01/12/2016 0618   BUN 16 01/12/2016 0618   BUN 14 09/04/2015 1454   CREATININE 0.98 01/12/2016 0618   CALCIUM 8.8 (L) 01/12/2016 0618   PROT 7.0 09/04/2015 1454   ALBUMIN 4.2 09/04/2015 1454   AST 23 09/04/2015 1454   ALT 17 09/04/2015 1454   ALKPHOS 165 (H) 09/04/2015 1454   BILITOT 0.4 09/04/2015 1454   GFRNONAA >60 01/12/2016 0618   GFRAA >60 01/12/2016 0618       ASSESSMENT AND PLAN 65 y.o. year old male  has a past medical history of Abnormality of gait  (08/17/2013); Arthritis; Asthma; Atrial flutter ([place]); Cancer ([place]); Cataract; Cervical myelopathy ([place]); Cervical spondylosis with myelopathy (08/17/2013); Gait disorder; High cholesterol; Hypertension; Hypothyroidism; Kidney stone; Memory loss; Paroxysmal atrial fibrillation ([place]); Peripheral edema; Rash, skin; Seizures ([place]) (1966); and Sleep apnea, obstructive. here with:  1. Seizures  Overall the patient is doing well. Will continue on phenobarbital and Keppra. I will check blood work today. Patient advised that if he has any seizure events he should let us know. Follow-up in one year or sooner if needed.     Ward Givens, MSN, NP-C 09/09/2016, 2:45 PM [facility] [address] [place], [place] [address] [phone]

## 2016-09-10 LAB — COMPREHENSIVE METABOLIC PANEL
ALT: 13 IU/L (ref 0–44)
AST: 23 IU/L (ref 0–40)
Albumin/Globulin Ratio: 1.4 (ref 1.2–2.2)
Albumin: 4.2 g/dL (ref 3.6–4.8)
Alkaline Phosphatase: 195 IU/L — ABNORMAL HIGH (ref 39–117)
BUN/Creatinine Ratio: 15 (ref 10–24)
BUN: 16 mg/dL (ref 8–27)
Bilirubin Total: 0.4 mg/dL (ref 0.0–1.2)
CALCIUM: 9.7 mg/dL (ref 8.6–10.2)
CHLORIDE: 98 mmol/L (ref 96–106)
CO2: 22 mmol/L (ref 18–29)
Creatinine, Ser: 1.05 mg/dL (ref 0.76–1.27)
GFR, EST AFRICAN AMERICAN: 87 mL/min/{1.73_m2} (ref >59)
GFR, EST NON AFRICAN AMERICAN: 75 mL/min/{1.73_m2} (ref >59)
GLUCOSE: 80 mg/dL (ref 65–99)
Globulin, Total: 3.1 g/dL (ref 1.5–4.5)
Potassium: 3.8 mmol/L (ref 3.5–5.2)
Sodium: 141 mmol/L (ref 134–144)
TOTAL PROTEIN: 7.3 g/dL (ref 6.0–8.5)

## 2016-09-10 LAB — CBC WITH DIFFERENTIAL/PLATELET
BASOS: 0 %
Basophils Absolute: 0 10*3/uL (ref 0.0–0.2)
EOS (ABSOLUTE): 0.2 10*3/uL (ref 0.0–0.4)
EOS: 2 %
HEMATOCRIT: 44.9 % (ref 37.5–51.0)
Hemoglobin: 15.8 g/dL (ref 12.6–17.7)
IMMATURE GRANS (ABS): 0 10*3/uL (ref 0.0–0.1)
IMMATURE GRANULOCYTES: 0 %
Lymphocytes Absolute: 1.8 10*3/uL (ref 0.7–3.1)
Lymphs: 23 %
MCH: 31.3 pg (ref 26.6–33.0)
MCHC: 35.2 g/dL (ref 31.5–35.7)
MCV: 89 fL (ref 79–97)
MONOS ABS: 1.1 10*3/uL — AB (ref 0.1–0.9)
Monocytes: 14 %
Neutrophils Absolute: 4.8 10*3/uL (ref 1.4–7.0)
Neutrophils: 61 %
PLATELETS: 234 10*3/uL (ref 150–379)
RBC: 5.05 x10E6/uL (ref 4.14–5.80)
RDW: 13.3 % (ref 12.3–15.4)
WBC: 7.9 10*3/uL (ref 3.4–10.8)

## 2016-09-10 LAB — PHENOBARBITAL LEVEL: Phenobarbital, Serum: 35 ug/mL (ref 15–40)

## 2016-09-13 ENCOUNTER — Telehealth: Payer: Self-pay | Admitting: *Deleted

## 2016-09-13 NOTE — Telephone Encounter (Signed)
-----   Message from Ward Givens, NP sent at 09/13/2016  7:42 AM EDT ----- Lab work unremarkable. Please call patient.

## 2016-09-13 NOTE — Telephone Encounter (Signed)
I have spoken with [name] this afternoon and per MM, advised that labs done in our office were ok--he should continue meds as rx'd.  He verbalized understanding of same/fim

## 2016-09-23 ENCOUNTER — Telehealth: Payer: Self-pay | Admitting: Internal Medicine

## 2016-09-23 ENCOUNTER — Telehealth: Payer: Self-pay | Admitting: Adult Health

## 2016-09-23 NOTE — Telephone Encounter (Signed)
[name] is calling to let you know that he called Dr. Jannifer Franklin office and they will get the message to his nurse to write a letter for him about not going to [employer] . Please call back if y ou have any questions

## 2016-09-23 NOTE — Telephone Encounter (Signed)
Patient called to request letter to be excused from jury duty December 5th.

## 2016-09-23 NOTE — Telephone Encounter (Signed)
New Message:    Pt wants to know if he can bring some form to be excused from jury duty for health reasons?

## 2016-09-23 NOTE — Telephone Encounter (Signed)
Spoke with patient and have asked her call Neuro to get them to write jury excuse for him.. Due to seizures and unsteady gait.  He is going to call them now

## 2016-09-23 NOTE — Telephone Encounter (Signed)
Patient's seizures are controlled. Is there a reason he feels he can't participate in [employer].

## 2016-09-24 NOTE — Telephone Encounter (Signed)
I spoke to pt and he stated that his gait was the issue.  He uses a cane.  I told him that due to sz being well controlled and using a cane, (and [place] which is where he is allocated to go (he changed it from [place]) that they have wheelchair access if needed and elevators.  I asked if he could go and he said yes, then wanted me to let MM know.  I told him that I would be glad to ask.

## 2016-09-27 NOTE — Telephone Encounter (Signed)
I think he can participate in [place] Duty at this time.

## 2016-09-27 NOTE — Telephone Encounter (Signed)
LMVM for pt that MM/NP stated that she thought he could participate in jury duty. He is to call back if questions.  I spoke to pt and relayed that he could participate in jury duty.  He stated that was all he wanted to know.

## 2016-10-04 ENCOUNTER — Encounter: Payer: Self-pay | Admitting: Physician Assistant

## 2016-10-18 ENCOUNTER — Encounter: Payer: Self-pay | Admitting: Physician Assistant

## 2016-10-18 ENCOUNTER — Ambulatory Visit: Payer: Medicare Other | Admitting: Physician Assistant

## 2016-10-19 ENCOUNTER — Ambulatory Visit (INDEPENDENT_AMBULATORY_CARE_PROVIDER_SITE_OTHER): Payer: Medicare Other | Admitting: Physician Assistant

## 2016-10-19 ENCOUNTER — Encounter (INDEPENDENT_AMBULATORY_CARE_PROVIDER_SITE_OTHER): Payer: Self-pay

## 2016-10-19 VITALS — BP 136/68 | HR 77 | Ht 72.0 in | Wt 280.0 lb

## 2016-10-19 DIAGNOSIS — I480 Paroxysmal atrial fibrillation: Secondary | ICD-10-CM

## 2016-10-19 DIAGNOSIS — I10 Essential (primary) hypertension: Secondary | ICD-10-CM

## 2016-10-19 NOTE — Progress Notes (Signed)
Cardiology Office Note Date:  10/19/2016  Patient ID:  [name] 09/23/1951, MRN [id number] PCP:  Melinda Crutch, MD  Cardiologist/Electrophysiologist: Dr. Rayann Heman    Chief Complaint:  Planned visit  History of Present Illness: [name] is a 65 y.o. male with history of new PAFib/flutter found at his hospital stay in Feb 2017, [place], HTN, seizure d/o.  He was admitted to [facility]/11/16 with c/o CP, noted to have WCT by EMS, that resolved upon insertion of his IV, this also resolved his symptom of CP, Dr. Rayann Heman, who noted unfortunately,there was not a 12 lead of the [facility] and could not therefore determine whether this was SVT or VT. Interestingly, the cycle length (225 msec) is identical to subsequent atrial flutter cycle length and felt is therefore possible that this arrhythmia was atrial flutter with 1:1 conduction. As the arrhythmia was hemodynamically stable, I am not certain that we would treat them differently. His echocardiogram was with normal LVEF , hs cardiac markers were negative for ACS, and felt his CP likely secondary to the tachycardia, was decided to treat him with Diltiazem and follow up out patient. His D dimer was elevated with negative CT chest for PE, his CXR felt not to be pneumonia. He has seizure disorder and gait disturbance, it was felt at this time, to hold off on full anticoagulation with CHA2DS2Vasc score of one.  He comes today being seen for Dr. Rayann Heman  and feeling well.  He saw Dr. Rayann Heman in May, at that time doing well, no changes were made with plans to see APP q 76months.   He has not had any kind of recurrent CP or palpitations, no SOB, no dizziness, near syncope or syncope.  He has been a couple years without his CPAP machine reports it borke and never was replaced, has not had this evaluated for a few years, this is re-discussed today and he declines offer to be re-evaluated for treatment, though will reach out to his PMD if he changes his mind.     He has chronic swelling b/l LE for years, remarks the right always worse, and reports he has a few years ago had several tests, including circulation without clear cause.  He reports the sensation on the bottom of feet is different when standing on the tile in his BR only, otherwise has no LE complaints. he wears orthopedic shoes for an unclear problem with his feet, and walks with the aid of cane secondary to chronic back problems and some degree of gait instability.    Past Medical History:  Diagnosis Date  . Abnormality of gait 08/17/2013  . Arthritis    left knee  . Asthma    "outgrew it from place I used to work at"  . Atrial flutter ([place])    with rapid conduction  . Cancer ([place])    skin ca to scalp, sees Dr. Tonia Brooms  . Cataract    bilateral  . Cervical myelopathy ([place])   . Cervical spondylosis with myelopathy 08/17/2013  . Gait disorder   . High cholesterol   . Hypertension   . Hypothyroidism   . Kidney stone    hx of  . Memory loss   . Paroxysmal atrial fibrillation (HCC)    chads2vasc score is 1  . Peripheral edema   . Rash, skin    to left lower leg   . Seizures ([place]) 1966   "started as gran mal; lessened to no loss of consciousness nowl  last ~ 2000" patient sees Dr. Floyde Parkins  . Sleep apnea, obstructive    uses cpap machine .9    Past Surgical History:  Procedure Laterality Date  . ANTERIOR CERVICAL DECOMP/DISCECTOMY FUSION  02/21/2012   Procedure: ANTERIOR CERVICAL DECOMPRESSION/DISCECTOMY FUSION 1 LEVEL;  Surgeon: Hosie Spangle, MD;  Location: [facility];  Service: Neurosurgery;  Laterality: N/A;  Cervical Five-Six anterior cervical decompression with fusion plating and bonegraft  . CATARACT EXTRACTION W/ INTRAOCULAR LENS  IMPLANT, BILATERAL  1990's  . CERVICAL DISCECTOMY  03/2011  . EYE SURGERY  1996-1998   "right eye; 4 times total; for detached retina"  . RETINAL DETACHMENT SURGERY     x 4 right eye    Current Outpatient Prescriptions    Medication Sig Dispense Refill  . aspirin EC 81 MG tablet Take 81 mg by mouth daily.      Marland Kitchen atorvastatin (LIPITOR) 40 MG tablet Take 40 mg by mouth every evening.      . diltiazem (TIAZAC) 360 MG 24 hr capsule Take 1 capsule (360 mg total) by mouth daily. 90 capsule 3  . hydrochlorothiazide (HYDRODIURIL) 25 MG tablet Take 25 mg by mouth daily. Take one-half tablet (12.5mg ) daily.    Marland Kitchen levETIRAcetam (KEPPRA) 750 MG tablet TAKE 1 TABLET (750 MG TOTAL) BY MOUTH 2 (TWO) TIMES DAILY. 180 tablet 3  . levothyroxine (SYNTHROID, LEVOTHROID) 150 MCG tablet Take 300-450 mcg by mouth daily before breakfast. Pt takes 450mg  on MWF - pt takes 300mg  all other days    . Multiple Vitamin (MULITIVITAMIN WITH MINERALS) TABS Take 1 tablet by mouth every morning.     Marland Kitchen PHENobarbital (LUMINAL) 32.4 MG tablet Take 1 tablet (32.4 mg total) by mouth daily. 30 tablet 5  . PHENobarbital (LUMINAL) 64.8 MG tablet Take 2 tablets (129.6 mg total) by mouth at bedtime. 60 tablet 5   No current facility-administered medications for this visit.     Allergies:   Depakote [divalproex sodium] and Latex   Social History:  The patient  reports that he has never smoked. He has never used smokeless tobacco. He reports that he does not drink alcohol or use drugs.   Family History:  The patient's family history includes Glaucoma in his father; Heart disease (age of onset: 23) in his mother; Hypertension in his father and mother; Sudden death in his brother.  ROS:  Please see the history of present illness.  All other systems are reviewed and otherwise negative.   PHYSICAL EXAM:  VS:  BP 136/68   Pulse 77   Ht 6' (1.829 m)   Wt 280 lb (127 kg)   BMI 37.97 kg/m  BMI: Body mass index is 37.97 kg/m. Well nourished, obese, tall, well developed, in no acute distress  HEENT: normocephalic, atraumatic  Neck: no JVD, carotid bruits or masses Cardiac:  RRR; no significant murmurs, no rubs, or gallops Lungs:  clear to auscultation  bilaterally, no wheezing, rhonchi or rales  Abd: soft, nontender MS: no deformity or atrophy Ext: wearing support stockings, + edema, 1+  Skin: warm and dry, no rash Neuro:  No gross deficits appreciated Psych: euthymic mood, full affect   EKG:  Done 02/10/16 SR, 87bpm, T changes III, aVF similar to previous  March 2017, 30day EM Predominant rhythm is sinus Rare atrial fibrillation with rapid ventricular rates and aberrancy are noted. Very rapidly conducting atrial flutter is also noted No significant brady arrhythmias are noted No AV block or pauses  01/28/16: Lexiscan stress  test Study Highlights     Nuclear stress EF: 63%.  There was no ST segment deviation noted during stress.  No T wave inversion was noted during stress.  Defect 1: There is a small defect present in the apex location.  The study is normal.  This is a low risk study.  The left ventricular ejection fraction is normal (55-65%).   There is a small defect present in the apex location. The defect is consistent with artifact.    Overall Study Impression Myocardial perfusion is normal. The study is normal. This is a low risk study. Overall left ventricular systolic function was normal. LV cavity size is normal. Nuclear stress EF: 63%. The left ventricular ejection fraction is normal (55-65%). There is no prior study for comparison.       01/11/16: Echocardiogram Study Conclusions  Left ventricle: The cavity size was normal. Wall thickness was  increased in a pattern of mild LVH. Systolic function was normal.  The estimated ejection fraction was in the range of 60% to 65%.  Wall motion was normal; there were no regional wall motion  abnormalities    Recent Labs: 01/11/2016: Hemoglobin 12.4; TSH 0.043 01/12/2016: Magnesium 1.8 09/09/2016: ALT 13; BUN 16; Creatinine, Ser 1.05; Platelets 234; Potassium 3.8; Sodium 141  No results found for requested labs within last 8760 hours.   CrCl cannot be  calculated (Patient's most recent lab result is older than the maximum 21 days allowed.).   Wt Readings from Last 3 Encounters:  10/19/16 280 lb (127 kg)  09/09/16 270 lb 12.8 oz (122.8 kg)  04/14/16 278 lb 12.8 oz (126.5 kg)     Other studies reviewed: Additional studies/records reviewed today include: summarized above  ASSESSMENT AND PLAN:  1. New AFib/flutter     CHA2DS2Vasc is one, not on a/c therapy with hx of seizure d/o, gait instability/back issues     EM noted rare RAFib/flutter w/abberrancy on diltiazem     No symptoms     2. CP    No recurrent episodes    negative stress test  3. WCT       Suspected to be AFlutter with 1:1 conduction, no 12 lead of the WCT      nml LVEF, negative stress test for any ischemia  4. HTN     Controlled  Disposition: 6 months, sooner if needed.  Current medicines are reviewed at length with the patient today.  The patient did not have any concerns regarding medicines.  Haywood Lasso, PA-C 10/19/2016 3:21 PM     [address] [place] [address] [phone] (office)  [phone] (fax)

## 2016-10-19 NOTE — Patient Instructions (Signed)
Medication Instructions:  Your physician recommends that you continue on your current medications as directed. Please refer to the Current Medication list given to you today.   If you need a refill on your cardiac medications before your next appointment, please call your pharmacy.  Labwork: NONE ORDERED  TODAY    Testing/Procedures: NONE ORDERED  TODAY    Follow-Up:  Your physician wants you to follow-up in:  IN  6  MONTHS WITH RENEE URSUY  You will receive a reminder letter in the mail two months in advance. If you don't receive a letter, please call our office to schedule the follow-up appointment.      Any Other Special Instructions Will Be Listed Below (If Applicable).                                                                                                                                                   

## 2017-03-16 ENCOUNTER — Other Ambulatory Visit: Payer: Self-pay | Admitting: Adult Health

## 2017-04-12 ENCOUNTER — Ambulatory Visit (INDEPENDENT_AMBULATORY_CARE_PROVIDER_SITE_OTHER): Payer: Medicare Other | Admitting: Physician Assistant

## 2017-04-12 ENCOUNTER — Encounter: Payer: Self-pay | Admitting: Physician Assistant

## 2017-04-12 VITALS — BP 138/78 | HR 78 | Ht 72.0 in | Wt 272.0 lb

## 2017-04-12 DIAGNOSIS — I10 Essential (primary) hypertension: Secondary | ICD-10-CM | POA: Diagnosis not present

## 2017-04-12 DIAGNOSIS — I480 Paroxysmal atrial fibrillation: Secondary | ICD-10-CM

## 2017-04-12 MED ORDER — DILTIAZEM HCL ER BEADS 360 MG PO CP24: 360.0000 mg | ORAL_CAPSULE | Freq: Every day | ORAL | 3 refills | Status: DC

## 2017-04-12 NOTE — Patient Instructions (Addendum)
Medication Instructions:   Your physician recommends that you continue on your current medications as directed. Please refer to the Current Medication list given to you today.   If you need a refill on your cardiac medications before your next appointment, please call your pharmacy.  Labwork: NONE ORDERED  TODAY    Testing/Procedures: NONE ORDERED  TODAY    Follow-Up: Your physician wants you to follow-up in:  IN  6  MONTHS WITH URSUY You will receive a reminder letter in the mail two months in advance. If you don't receive a letter, please call our office to schedule the follow-up appointment.      Any Other Special Instructions Will Be Listed Below (If Applicable).                                                                                                                                                   

## 2017-04-12 NOTE — Progress Notes (Signed)
Cardiology Office Note Date:  04/12/2017  Patient ID:  [name], [name] 1951-07-04, MRN [id number] PCP:  Lawerance Cruel, MD  Cardiologist/Electrophysiologist: Dr. Rayann Heman    Chief Complaint:  Planned visit  History of Present Illness: [name] is a 66 y.o. male with history of new PAFib/flutter found at his hospital stay in Feb 2017, [place], HTN, seizure d/o.  He was admitted to [facility] 01/11/16 with c/o CP, noted to have WCT by EMS, that resolved upon insertion of his IV, this also resolved his symptom of CP, Dr. Rayann Heman, who noted unfortunately,there was not a 12 lead of the [facility] and could not therefore determine whether this was SVT or VT. Interestingly, the cycle length (225 msec) is identical to subsequent atrial flutter cycle length and felt is therefore possible that this arrhythmia was atrial flutter with 1:1 conduction. As the arrhythmia was hemodynamically stable, I am not certain that we would treat them differently. His echocardiogram was with normal LVEF , hs cardiac markers were negative for ACS, and felt his CP likely secondary to the tachycardia, was decided to treat him with Diltiazem and follow up out patient. His D dimer was elevated with negative CT chest for PE, his CXR felt not to be pneumonia. He has seizure disorder and gait disturbance, it was felt at this time, to hold off on full anticoagulation with CHA2DS2Vasc score of one.  He comes today being seen for Dr. Rayann Heman  and feeling well.  He saw Dr. Rayann Heman in May 2017, at that time doing well, no changes were made with plans to see APP q 19months. Most recently seen by myself in November, also doing well, without changes.  He again tells me he has not had any kind of recurrent CP or palpitations, no SOB, no dizziness, near syncope or syncope.    He has chronic swelling b/l LE for years, remarks the left always worse, and reports he has a few years ago had several tests, including circulation without clear  cause.  He wears orthopedic shoes for an unclear problem with his feet, and walks with the aid of cane secondary to chronic back problems and some degree of gait instability.   He has not pursued re-evaluation for his hx of OSA/new CPAP machine.  Past Medical History:  Diagnosis Date  . Abnormality of gait 08/17/2013  . Arthritis    left knee  . Asthma    "outgrew it from place I used to work at"  . Atrial flutter ([place])    with rapid conduction  . Cancer ([place])    skin ca to scalp, sees Dr. Tonia Brooms  . Cataract    bilateral  . Cervical myelopathy ([place])   . Cervical spondylosis with myelopathy 08/17/2013  . Gait disorder   . High cholesterol   . Hypertension   . Hypothyroidism   . Kidney stone    hx of  . Memory loss   . Paroxysmal atrial fibrillation (HCC)    chads2vasc score is 1  . Peripheral edema   . Rash, skin    to left lower leg   . Seizures ([place]) 1966   "started as gran mal; lessened to no loss of consciousness nowl last ~ 2000" patient sees Dr. Floyde Parkins  . Sleep apnea, obstructive    uses cpap machine .9    Past Surgical History:  Procedure Laterality Date  . ANTERIOR CERVICAL DECOMP/DISCECTOMY FUSION  02/21/2012   Procedure: ANTERIOR CERVICAL DECOMPRESSION/DISCECTOMY FUSION 1 LEVEL;  Surgeon: Hosie Spangle, MD;  Location: [place] [facility];  Service: Neurosurgery;  Laterality: N/A;  Cervical Five-Six anterior cervical decompression with fusion plating and bonegraft  . CATARACT EXTRACTION W/ INTRAOCULAR LENS  IMPLANT, BILATERAL  1990's  . CERVICAL DISCECTOMY  03/2011  . EYE SURGERY  1996-1998   "right eye; 4 times total; for detached retina"  . RETINAL DETACHMENT SURGERY     x 4 right eye    Current Outpatient Prescriptions  Medication Sig Dispense Refill  . aspirin EC 81 MG tablet Take 81 mg by mouth daily.      Marland Kitchen atorvastatin (LIPITOR) 40 MG tablet Take 40 mg by mouth every evening.      . diltiazem (TIAZAC) 360 MG 24 hr capsule Take 1 capsule (360 mg total)  by mouth daily. 90 capsule 3  . hydrochlorothiazide (HYDRODIURIL) 12.5 MG tablet Take 12.5 mg by mouth daily.  0  . levETIRAcetam (KEPPRA) 750 MG tablet TAKE 1 TABLET (750 MG TOTAL) BY MOUTH 2 (TWO) TIMES DAILY. 180 tablet 3  . levothyroxine (SYNTHROID, LEVOTHROID) 150 MCG tablet Take 300-450 mcg by mouth daily before breakfast. Pt takes 450mg  on MWF - pt takes 300mg  all other days    . Multiple Vitamin (MULITIVITAMIN WITH MINERALS) TABS Take 1 tablet by mouth every morning.     Marland Kitchen PHENobarbital (LUMINAL) 32.4 MG tablet take 1 tablet by mouth once daily 30 tablet 5  . PHENobarbital (LUMINAL) 64.8 MG tablet take 2 tablets by mouth at bedtime 60 tablet 5   No current facility-administered medications for this visit.     Allergies:   Depakote [divalproex sodium] and Latex   Social History:  The patient  reports that he has never smoked. He has never used smokeless tobacco. He reports that he does not drink alcohol or use drugs.   Family History:  The patient's family history includes Glaucoma in his father; Heart disease (age of onset: 53) in his mother; Hypertension in his father and mother; Sudden death in his brother.  ROS:  Please see the history of present illness.  All other systems are reviewed and otherwise negative.   PHYSICAL EXAM:  VS:  BP 138/78   Pulse 78   Ht 6' (1.829 m)   Wt 272 lb (123.4 kg)   BMI 36.89 kg/m  BMI: Body mass index is 36.89 kg/m. Well nourished, obese, tall, well developed, in no acute distress  HEENT: normocephalic, atraumatic  Neck: no JVD, carotid bruits or masses Cardiac:   RRR; no significant murmurs, no rubs, or gallops Lungs:  CTA b/l, no wheezing, rhonchi or rales  Abd: soft, nontender MS: no deformity or atrophy Ext: wearing support stockings, + non-pitting edema R/L Skin: warm and dry, no rash Neuro:  No gross deficits appreciated Psych: euthymic mood, full affect   EKG:  Done today and reviewed by myself is SR 67bpm, PR 1100ms, QRS 153ms,  QTc 462ms  March 2017, 30day EM Predominant rhythm is sinus Rare atrial fibrillation with rapid ventricular rates and aberrancy are noted. Very rapidly conducting atrial flutter is also noted No significant brady arrhythmias are noted No AV block or pauses  01/28/16: Lexiscan stress test Study Highlights     Nuclear stress EF: 63%.  There was no ST segment deviation noted during stress.  No T wave inversion was noted during stress.  Defect 1: There is a small defect present in the apex location.  The study is normal.  This is a low risk study.  The left ventricular ejection fraction is normal (55-65%).   There is a small defect present in the apex location. The defect is consistent with artifact.    Overall Study Impression Myocardial perfusion is normal. The study is normal. This is a low risk study. Overall left ventricular systolic function was normal. LV cavity size is normal. Nuclear stress EF: 63%. The left ventricular ejection fraction is normal (55-65%). There is no prior study for comparison.     01/11/16: Echocardiogram Study Conclusions  Left ventricle: The cavity size was normal. Wall thickness was  increased in a pattern of mild LVH. Systolic function was normal.  The estimated ejection fraction was in the range of 60% to 65%.  Wall motion was normal; there were no regional wall motion  abnormalities    Recent Labs: 09/09/2016: ALT 13; BUN 16; Creatinine, Ser 1.05; Platelets 234; Potassium 3.8; Sodium 141  No results found for requested labs within last 8760 hours.   CrCl cannot be calculated (Patient's most recent lab result is older than the maximum 21 days allowed.).   Wt Readings from Last 3 Encounters:  04/12/17 272 lb (123.4 kg)  10/19/16 280 lb (127 kg)  09/09/16 270 lb 12.8 oz (122.8 kg)     Other studies reviewed: Additional studies/records reviewed today include: summarized above  ASSESSMENT AND PLAN:  1. New AFib/flutter      CHA2DS2Vasc remains one, not on a/c therapy with hx of seizure d/o, gait instability/back issues     EM noted rare RAFib/flutter w/abberrancy on diltiazem     No symptoms     His CHADS score ill be 2 when he turns 67, will need to discuss/give thought to consideration of a/c then Re-discussed OSA and treatment of this as well as weight loss.  He sees his PMD next month for full labs/physical, he prefers to discuss with him sleep testing/CPAP there.  He reports a 30lb weight loss in the last couple years, will continue to work on this  2. CP    No recurrent episodes    negative stress test  3. WCT       Suspected to be AFlutter with 1:1 conduction, no 12 lead of the WCT      nml LVEF, negative stress test for any ischemia  4. HTN     Looks OK, no changes  Disposition:  6 months, sooner if needed.  Current medicines are reviewed at length with the patient today.  The patient did not have any concerns regarding medicines.  Haywood Lasso, PA-C 04/12/2017 1:16 PM     [address] [place] [address] [phone] (office)  [phone] (fax)

## 2017-04-19 ENCOUNTER — Other Ambulatory Visit: Payer: Self-pay | Admitting: Internal Medicine

## 2017-05-03 ENCOUNTER — Other Ambulatory Visit: Payer: Self-pay | Admitting: Internal Medicine

## 2017-05-03 NOTE — Telephone Encounter (Signed)
Medication Detail    Disp Refills Start End   diltiazem (TIAZAC) 360 MG 24 hr capsule 90 capsule 3 04/12/2017    Sig - Route: Take 1 capsule (360 mg total) by mouth daily. - Oral   E-Prescribing Status: Receipt confirmed by pharmacy (04/12/2017 1:17 PM EDT)   Pharmacy   [id number] - [place], [place] - [address]

## 2017-05-12 ENCOUNTER — Ambulatory Visit (INDEPENDENT_AMBULATORY_CARE_PROVIDER_SITE_OTHER): Payer: Medicare Other | Admitting: Podiatry

## 2017-05-12 ENCOUNTER — Encounter: Payer: Self-pay | Admitting: Podiatry

## 2017-05-12 DIAGNOSIS — M2142 Flat foot [pes planus] (acquired), left foot: Secondary | ICD-10-CM

## 2017-05-12 DIAGNOSIS — M21969 Unspecified acquired deformity of unspecified lower leg: Secondary | ICD-10-CM | POA: Diagnosis not present

## 2017-05-12 DIAGNOSIS — R609 Edema, unspecified: Secondary | ICD-10-CM | POA: Diagnosis not present

## 2017-05-12 DIAGNOSIS — M2141 Flat foot [pes planus] (acquired), right foot: Secondary | ICD-10-CM

## 2017-05-16 NOTE — Progress Notes (Signed)
Subjective:    Patient ID: [name], male   DOB: 66 y.o.   MRN: [id number]   HPI 66 year old male presents the office today requesting custom shoes. He denies any pain to his feet he has had chronic swelling to both of his feet he continues to have custom shoes. His last by mouth she is unable 5 years ago. He does wear compression socks. Asking is anything else besides a slip on compression sock that he can wear. Denies any redness or warmth to his feet he has no concerns otherwise.   Review of Systems  All other systems reviewed and are negative.       Objective:  Physical Exam General: AAO x3, NAD  Dermatological: No open sores identified. There is no erythema or increase in warmth to bilateral lower extremities.  Vascular: Dorsalis Pedis artery and Posterior Tibial artery pedal pulses are 1/4 bilateral with immedate capillary fill time. This may be decreased is because of the swelling to his legs. He denies any claudication symptoms. The feet have normal proximal distal temperature. There is no pain with calf compression, warmth, erythema.   Neruologic: Grossly intact via light touch bilateral. Protective threshold with Semmes Wienstein monofilament intact to all pedal sites bilateral.   Musculoskeletal: Flatfoot assessment bilaterally. Chronic swelling identified to bilateral lower extremities.  Gait: Unassisted, Nonantalgic.      Assessment:     Chronic swelling, flatfoot needing custom shoes   Plan:  -Treatment options discussed including all alternatives, risks, and complications -Etiology of symptoms were discussed -He was measured for juxtalite compression stocking so that he can wrap instead of having to pull on. He may do better with this and wear more often.  -I did go ahead and mold him for custom shoes today. However, he may need to come back in to see Liliane Channel for further measurements.   Celesta Gentile, DPM

## 2017-05-19 ENCOUNTER — Telehealth: Payer: Self-pay | Admitting: Podiatry

## 2017-05-19 NOTE — Telephone Encounter (Signed)
OK please let him know they won't be covered then.

## 2017-05-19 NOTE — Telephone Encounter (Signed)
I informed [name] - [facility], pt did not have wounds to his legs and to offer the self pay option.

## 2017-05-19 NOTE — Telephone Encounter (Signed)
I received your updated order that you sent. However, we don't know if he does have any wounds or not, if so he would need a wound assessment, if not he would be self pay. Number two, on the compression levels we need # 30-40 marked off for the left and right. Also you did put compression wraps but I need to know what kind you were specifying for. Please call me back directly at [phone].

## 2017-05-30 ENCOUNTER — Telehealth: Payer: Self-pay | Admitting: *Deleted

## 2017-05-30 NOTE — Telephone Encounter (Signed)
Dr. Jacqualyn Posey ordered Gradient Compression wraps for B/L legs measuring L: calf 18cm, ankle 13cm, length 17 1/2, R: calf 18cm, ankle 14cm, length 17 1/2cm for daily changes, faxed to [facility].

## 2017-06-15 ENCOUNTER — Encounter (HOSPITAL_COMMUNITY): Payer: Self-pay | Admitting: Pharmacy Technician

## 2017-06-15 ENCOUNTER — Emergency Department (HOSPITAL_COMMUNITY): Payer: Medicare Other

## 2017-06-15 ENCOUNTER — Observation Stay (HOSPITAL_COMMUNITY)
Admission: EM | Admit: 2017-06-15 | Discharge: 2017-06-17 | Disposition: A | Discharge status: Home / Self Care | Payer: Medicare Other | Attending: Internal Medicine | Admitting: Internal Medicine

## 2017-06-15 DIAGNOSIS — R569 Unspecified convulsions: Secondary | ICD-10-CM

## 2017-06-15 DIAGNOSIS — J189 Pneumonia, unspecified organism: Secondary | ICD-10-CM

## 2017-06-15 DIAGNOSIS — J45909 Unspecified asthma, uncomplicated: Secondary | ICD-10-CM | POA: Insufficient documentation

## 2017-06-15 DIAGNOSIS — R627 Adult failure to thrive: Secondary | ICD-10-CM | POA: Diagnosis not present

## 2017-06-15 DIAGNOSIS — R319 Hematuria, unspecified: Secondary | ICD-10-CM

## 2017-06-15 DIAGNOSIS — N179 Acute kidney failure, unspecified: Secondary | ICD-10-CM | POA: Insufficient documentation

## 2017-06-15 DIAGNOSIS — E039 Hypothyroidism, unspecified: Secondary | ICD-10-CM | POA: Diagnosis not present

## 2017-06-15 DIAGNOSIS — I5032 Chronic diastolic (congestive) heart failure: Secondary | ICD-10-CM | POA: Insufficient documentation

## 2017-06-15 DIAGNOSIS — A419 Sepsis, unspecified organism: Secondary | ICD-10-CM | POA: Diagnosis not present

## 2017-06-15 DIAGNOSIS — E669 Obesity, unspecified: Secondary | ICD-10-CM | POA: Diagnosis not present

## 2017-06-15 DIAGNOSIS — G8929 Other chronic pain: Secondary | ICD-10-CM | POA: Insufficient documentation

## 2017-06-15 DIAGNOSIS — R0902 Hypoxemia: Secondary | ICD-10-CM

## 2017-06-15 DIAGNOSIS — Z7982 Long term (current) use of aspirin: Secondary | ICD-10-CM | POA: Diagnosis not present

## 2017-06-15 DIAGNOSIS — N3000 Acute cystitis without hematuria: Secondary | ICD-10-CM

## 2017-06-15 DIAGNOSIS — Z85828 Personal history of other malignant neoplasm of skin: Secondary | ICD-10-CM | POA: Insufficient documentation

## 2017-06-15 DIAGNOSIS — I110 Hypertensive heart disease with heart failure: Secondary | ICD-10-CM | POA: Diagnosis not present

## 2017-06-15 DIAGNOSIS — I4892 Unspecified atrial flutter: Secondary | ICD-10-CM | POA: Insufficient documentation

## 2017-06-15 DIAGNOSIS — M1990 Unspecified osteoarthritis, unspecified site: Secondary | ICD-10-CM | POA: Diagnosis not present

## 2017-06-15 DIAGNOSIS — E785 Hyperlipidemia, unspecified: Secondary | ICD-10-CM | POA: Insufficient documentation

## 2017-06-15 DIAGNOSIS — N10 Acute tubulo-interstitial nephritis: Secondary | ICD-10-CM | POA: Insufficient documentation

## 2017-06-15 DIAGNOSIS — Z6835 Body mass index (BMI) 35.0-35.9, adult: Secondary | ICD-10-CM | POA: Diagnosis not present

## 2017-06-15 DIAGNOSIS — Z23 Encounter for immunization: Secondary | ICD-10-CM | POA: Diagnosis not present

## 2017-06-15 DIAGNOSIS — G4733 Obstructive sleep apnea (adult) (pediatric): Secondary | ICD-10-CM | POA: Diagnosis not present

## 2017-06-15 DIAGNOSIS — R531 Weakness: Secondary | ICD-10-CM | POA: Insufficient documentation

## 2017-06-15 DIAGNOSIS — W010XXA Fall on same level from slipping, tripping and stumbling without subsequent striking against object, initial encounter: Secondary | ICD-10-CM | POA: Insufficient documentation

## 2017-06-15 DIAGNOSIS — E7800 Pure hypercholesterolemia, unspecified: Secondary | ICD-10-CM | POA: Insufficient documentation

## 2017-06-15 DIAGNOSIS — B009 Herpesviral infection, unspecified: Secondary | ICD-10-CM | POA: Insufficient documentation

## 2017-06-15 DIAGNOSIS — I480 Paroxysmal atrial fibrillation: Secondary | ICD-10-CM | POA: Diagnosis not present

## 2017-06-15 DIAGNOSIS — K5900 Constipation, unspecified: Secondary | ICD-10-CM | POA: Diagnosis not present

## 2017-06-15 DIAGNOSIS — I959 Hypotension, unspecified: Secondary | ICD-10-CM | POA: Insufficient documentation

## 2017-06-15 HISTORY — DX: Chronic diastolic (congestive) heart failure: I50.32

## 2017-06-15 LAB — CBC WITH DIFFERENTIAL/PLATELET
Basophils Absolute: 0 10*3/uL (ref 0.0–0.1)
Basophils Relative: 0 %
Eosinophils Absolute: 0 10*3/uL (ref 0.0–0.7)
Eosinophils Relative: 0 %
HEMATOCRIT: 43.3 % (ref 39.0–52.0)
HEMOGLOBIN: 14.9 g/dL (ref 13.0–17.0)
LYMPHS ABS: 0.8 10*3/uL (ref 0.7–4.0)
LYMPHS PCT: 6 %
MCH: 30.8 pg (ref 26.0–34.0)
MCHC: 34.4 g/dL (ref 30.0–36.0)
MCV: 89.6 fL (ref 78.0–100.0)
MONOS PCT: 8 %
Monocytes Absolute: 1 10*3/uL (ref 0.1–1.0)
NEUTROS ABS: 11.7 10*3/uL — AB (ref 1.7–7.7)
NEUTROS PCT: 86 %
Platelets: 158 10*3/uL (ref 150–400)
RBC: 4.83 MIL/uL (ref 4.22–5.81)
RDW: 12.9 % (ref 11.5–15.5)
WBC: 13.5 10*3/uL — AB (ref 4.0–10.5)

## 2017-06-15 LAB — LACTIC ACID, PLASMA
Lactic Acid, Venous: 0.8 mmol/L (ref 0.5–1.9)
Lactic Acid, Venous: 1.3 mmol/L (ref 0.5–1.9)

## 2017-06-15 LAB — URINALYSIS, ROUTINE W REFLEX MICROSCOPIC
Bilirubin Urine: NEGATIVE
Glucose, UA: 50 mg/dL — AB
Ketones, ur: NEGATIVE mg/dL
NITRITE: NEGATIVE
PROTEIN: 100 mg/dL — AB
SPECIFIC GRAVITY, URINE: 1.011 (ref 1.005–1.030)
pH: 8 (ref 5.0–8.0)

## 2017-06-15 LAB — PROCALCITONIN: PROCALCITONIN: 0.31 ng/mL

## 2017-06-15 LAB — COMPREHENSIVE METABOLIC PANEL
ALK PHOS: 123 U/L (ref 38–126)
ALT: 21 U/L (ref 17–63)
ANION GAP: 10 (ref 5–15)
AST: 38 U/L (ref 15–41)
Albumin: 3.5 g/dL (ref 3.5–5.0)
BILIRUBIN TOTAL: 0.6 mg/dL (ref 0.3–1.2)
BUN: 12 mg/dL (ref 6–20)
CALCIUM: 9.4 mg/dL (ref 8.9–10.3)
CO2: 25 mmol/L (ref 22–32)
CREATININE: 1.37 mg/dL — AB (ref 0.61–1.24)
Chloride: 101 mmol/L (ref 101–111)
GFR calc non Af Amer: 53 mL/min — ABNORMAL LOW (ref >60)
GLUCOSE: 119 mg/dL — AB (ref 65–99)
Potassium: 3.5 mmol/L (ref 3.5–5.1)
Sodium: 136 mmol/L (ref 135–145)
TOTAL PROTEIN: 6.6 g/dL (ref 6.5–8.1)

## 2017-06-15 LAB — PROTIME-INR
INR: 1.26
PROTHROMBIN TIME: 15.8 s — AB (ref 11.4–15.2)

## 2017-06-15 LAB — APTT: APTT: 34 s (ref 24–36)

## 2017-06-15 LAB — I-STAT CG4 LACTIC ACID, ED: Lactic Acid, Venous: 0.88 mmol/L (ref 0.5–1.9)

## 2017-06-15 MED ORDER — SODIUM CHLORIDE 0.9 % IV SOLN
INTRAVENOUS | Status: DC
  Administered 2017-06-15: 19:00:00 via INTRAVENOUS

## 2017-06-15 MED ORDER — ACETAMINOPHEN 650 MG RE SUPP: 650.0000 mg | Freq: Four times a day (QID) | RECTAL | Status: DC | PRN

## 2017-06-15 MED ORDER — CEFTRIAXONE SODIUM 1 G IJ SOLR
1.0000 g | INTRAMUSCULAR | Status: DC
  Administered 2017-06-16: 1 g via INTRAVENOUS
  Filled 2017-06-15 (×3): qty 10

## 2017-06-15 MED ORDER — CEFTRIAXONE SODIUM 2 G IJ SOLR
2.0000 g | Freq: Once | INTRAMUSCULAR | Status: AC
  Administered 2017-06-15: 2 g via INTRAVENOUS
  Filled 2017-06-15: qty 2

## 2017-06-15 MED ORDER — VANCOMYCIN HCL IN DEXTROSE 1-5 GM/200ML-% IV SOLN
1000.0000 mg | Freq: Once | INTRAVENOUS | Status: DC
Start: 2017-06-15 — End: 2017-06-15
  Filled 2017-06-15: qty 200

## 2017-06-15 MED ORDER — ACETAMINOPHEN 325 MG PO TABS: 650.0000 mg | ORAL_TABLET | Freq: Four times a day (QID) | ORAL | Status: DC | PRN

## 2017-06-15 MED ORDER — PIPERACILLIN-TAZOBACTAM 3.375 G IVPB 30 MIN
3.3750 g | Freq: Once | INTRAVENOUS | Status: AC
  Administered 2017-06-15: 3.375 g via INTRAVENOUS
  Filled 2017-06-15: qty 50

## 2017-06-15 MED ORDER — ACETAMINOPHEN 500 MG PO TABS
1000.0000 mg | ORAL_TABLET | Freq: Once | ORAL | Status: AC
  Administered 2017-06-15: 1000 mg via ORAL
  Filled 2017-06-15: qty 2

## 2017-06-15 MED ORDER — SODIUM CHLORIDE 0.9 % IV BOLUS (SEPSIS)
1000.0000 mL | Freq: Once | INTRAVENOUS | Status: AC
  Administered 2017-06-15: 1000 mL via INTRAVENOUS

## 2017-06-15 MED ORDER — HEPARIN SODIUM (PORCINE) 5000 UNIT/ML IJ SOLN
5000.0000 [IU] | Freq: Three times a day (TID) | INTRAMUSCULAR | Status: DC
  Administered 2017-06-15 – 2017-06-17 (×4): 5000 [IU] via SUBCUTANEOUS
  Filled 2017-06-15 (×5): qty 1

## 2017-06-15 MED ORDER — SODIUM CHLORIDE 0.9 % IV BOLUS (SEPSIS): 1000.0000 mL | Freq: Once | INTRAVENOUS | Status: DC

## 2017-06-15 MED ORDER — ONDANSETRON HCL 4 MG/2ML IJ SOLN: 4.0000 mg | Freq: Four times a day (QID) | INTRAMUSCULAR | Status: DC | PRN

## 2017-06-15 MED ORDER — ONDANSETRON HCL 4 MG PO TABS: 4.0000 mg | ORAL_TABLET | Freq: Four times a day (QID) | ORAL | Status: DC | PRN

## 2017-06-15 NOTE — H&P (Addendum)
History and Physical    [name] DPO:[id number] DOB: 1951/08/19 DOA: 06/15/2017  PCP: Lawerance Cruel, MD Patient coming from: Home   Chief Complaint: Fall and weakness.   HPI: [name] is a 66 y.o. male with medical history significant of arthritis, asthma, Aflutter, Cancer, cervical myelopathy, HLD, HTN, Hypothyroidism, Nephrolithiasis, memory loss, PAF, seizures, objective sleep apnea. Patient reports being in his home state of health until the morning of admission when he fell coming out of the shower. Patient reports taking a shower and having difficulty with holding his soap another sharp products. In an attempt to get out of the shower he reports having his feet getting caught up in the bath rug and falling to the ground striking his left buttock on the ground. Patient denies any head trauma or loss of consciousness. Patient was able to get to the phone and call EMS who brought patient to the emergency room. In the field patient was noted to be tachycardic with heart rate in the 130s and to have suboptimal blood pressures in the 80/60 range. Patient denies any recent dysuria, frequency, flank pain, vomiting, chest pain, shortness breath, palpitations, fevers, seizures, focal neurological deficits.   ED Course: Sepsis protocol initiated. Patient start broad-spectrum antibiotics.  Review of Systems: As per HPI otherwise all other systems reviewed and are negative  Ambulatory Status: No restrictions.  Past Medical History:  Diagnosis Date  . Abnormality of gait 08/17/2013  . Arthritis    left knee  . Asthma    "outgrew it from place I used to work at"  . Atrial flutter ([place])    with rapid conduction  . Cancer ([place])    skin ca to scalp, sees Dr. Tonia Brooms  . Cataract    bilateral  . Cervical myelopathy ([place])   . Cervical spondylosis with myelopathy 08/17/2013  . Gait disorder   . High cholesterol   . Hypertension   . Hypothyroidism   . Kidney stone    hx of    . Memory loss   . Paroxysmal atrial fibrillation (HCC)    chads2vasc score is 1  . Peripheral edema   . Rash, skin    to left lower leg   . Seizures ([place]) 1966   "started as gran mal; lessened to no loss of consciousness nowl last ~ 2000" patient sees Dr. Floyde Parkins  . Sleep apnea, obstructive    uses cpap machine .9    Past Surgical History:  Procedure Laterality Date  . ANTERIOR CERVICAL DECOMP/DISCECTOMY FUSION  02/21/2012   Procedure: ANTERIOR CERVICAL DECOMPRESSION/DISCECTOMY FUSION 1 LEVEL;  Surgeon: Hosie Spangle, MD;  Location: [place] [facility];  Service: Neurosurgery;  Laterality: N/A;  Cervical Five-Six anterior cervical decompression with fusion plating and bonegraft  . CATARACT EXTRACTION W/ INTRAOCULAR LENS  IMPLANT, BILATERAL  1990's  . CERVICAL DISCECTOMY  03/2011  . EYE SURGERY  1996-1998   "right eye; 4 times total; for detached retina"  . RETINAL DETACHMENT SURGERY     x 4 right eye    Social History   Social History  . Marital status: Married    Spouse name: N/A  . Number of children: 0  . Years of education: N/A   Occupational History  . 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 Smoking status: Never Smoker  . Smokeless tobacco: Never Used  . Alcohol use No  . Drug use: No  . Sexual activity:  Not Currently   Other Topics Concern  . Not on file   Social History Narrative  . No narrative on file    Allergies  Allergen Reactions  . Depakote [Divalproex Sodium]     Intolerant  . Latex Rash    Rash and mild hives    Family History  Problem Relation Age of Onset  . Hypertension Mother   . Heart disease Mother 53  . Hypertension Father   . Glaucoma Father   . Sudden death Brother       Prior to Admission medications   Medication Sig Start Date End Date Taking? Authorizing Provider  aspirin EC 81 MG tablet Take 81 mg by mouth daily.      [provider]  atorvastatin (LIPITOR) 40  MG tablet Take 40 mg by mouth every evening.      [provider]  diltiazem (TIAZAC) 360 MG 24 hr capsule Take 1 capsule (360 mg total) by mouth daily. 04/12/17   Baldwin Jamaica, PA-C  hydrochlorothiazide (HYDRODIURIL) 12.5 MG tablet Take 12.5 mg by mouth daily. 01/12/17   [provider]  levETIRAcetam (KEPPRA) 750 MG tablet TAKE 1 TABLET (750 MG TOTAL) BY MOUTH 2 (TWO) TIMES DAILY. 09/09/16   Ward Givens, NP  levothyroxine (SYNTHROID, LEVOTHROID) 150 MCG tablet Take 300-450 mcg by mouth daily before breakfast. Pt takes 450mg  on MWF - pt takes 300mg  all other days    [provider]  Multiple Vitamin (MULITIVITAMIN WITH MINERALS) TABS Take 1 tablet by mouth every morning.     [provider]  PHENobarbital (LUMINAL) 32.4 MG tablet take 1 tablet by mouth once daily 03/16/17   Ward Givens, NP  PHENobarbital (LUMINAL) 64.8 MG tablet take 2 tablets by mouth at bedtime 03/16/17   Ward Givens, NP    Physical Exam: Vitals:   06/15/17 1445 06/15/17 1500 06/15/17 1530 06/15/17 1600  BP:  98/86 (!) 87/62 (!) 119/59  Pulse: 83 81 73 77  Resp: 19 18 15 13   Temp:  98.1 F (36.7 C)    TempSrc:  Oral    SpO2: 94% 95% 95% 94%     General:  Appears calm and comfortable Eyes:  PERRL, EOMI, normal lids, iris ENT:  grossly normal hearing, lips & tongue, mmm Neck:  no LAD, masses or thyromegaly Cardiovascular:  RRR, no m/r/g. No LE edema.  Respiratory:  CTA bilaterally, no w/r/r. Normal respiratory effort. Abdomen:  soft, ntnd, NABS, No CVA tendenress Skin:  no rash or induration seen on limited exam Musculoskeletal:  grossly normal tone BUE/BLE, good ROM, no bony abnormality Psychiatric:  grossly normal mood and affect, speech fluent and appropriate, AOx3 Neurologic:  CN 2-12 grossly intact, moves all extremities in coordinated fashion, sensation intact  Labs on Admission: I have personally reviewed following labs and imaging studies  CBC:  Recent  Labs Lab 06/15/17 1433  WBC 13.5*  NEUTROABS 11.7*  HGB 14.9  HCT 43.3  MCV 89.6  PLT 371   Basic Metabolic Panel:  Recent Labs Lab 06/15/17 1433  NA 136  K 3.5  CL 101  CO2 25  GLUCOSE 119*  BUN 12  CREATININE 1.37*  CALCIUM 9.4   GFR: CrCl cannot be calculated (Unknown ideal weight.). Liver Function Tests:  Recent Labs Lab 06/15/17 1433  AST 38  ALT 21  ALKPHOS 123  BILITOT 0.6  PROT 6.6  ALBUMIN 3.5   No results for input(s): LIPASE, AMYLASE in the last 168 hours. No results for  input(s): AMMONIA in the last 168 hours. Coagulation Profile: No results for input(s): INR, PROTIME in the last 168 hours. Cardiac Enzymes: No results for input(s): CKTOTAL, CKMB, CKMBINDEX, TROPONINI in the last 168 hours. BNP (last 3 results) No results for input(s): PROBNP in the last 8760 hours. HbA1C: No results for input(s): HGBA1C in the last 72 hours. CBG: No results for input(s): GLUCAP in the last 168 hours. Lipid Profile: No results for input(s): CHOL, HDL, LDLCALC, TRIG, CHOLHDL, LDLDIRECT in the last 72 hours. Thyroid Function Tests: No results for input(s): TSH, T4TOTAL, FREET4, T3FREE, THYROIDAB in the last 72 hours. Anemia Panel: No results for input(s): VITAMINB12, FOLATE, FERRITIN, TIBC, IRON, RETICCTPCT in the last 72 hours. Urine analysis:    Component Value Date/Time   COLORURINE YELLOW 06/15/2017 1551   APPEARANCEUR HAZY (A) 06/15/2017 1551   LABSPEC 1.011 06/15/2017 1551   PHURINE 8.0 06/15/2017 1551   GLUCOSEU 50 (A) 06/15/2017 1551   HGBUR MODERATE (A) 06/15/2017 1551   BILIRUBINUR NEGATIVE 06/15/2017 1551   KETONESUR NEGATIVE 06/15/2017 1551   PROTEINUR 100 (A) 06/15/2017 1551   UROBILINOGEN 0.2 05/13/2008 0649   NITRITE NEGATIVE 06/15/2017 1551   LEUKOCYTESUR SMALL (A) 06/15/2017 1551    Creatinine Clearance: CrCl cannot be calculated (Unknown ideal weight.).  Sepsis Labs: @LABRCNTIP (procalcitonin:4,lacticidven:4) )No results found for  this or any previous visit (from the past 240 hour(s)).   Radiological Exams on Admission: Dg Chest 2 View  Result Date: 06/15/2017 CLINICAL DATA:  Status post fall at home in the bathroom. Persistent right buttock pain. Patient has history of hypertension, hyperlipidemia, and atrial fibrillation. EXAM: CHEST  2 VIEW COMPARISON:  Chest x-ray and chest CT scan of January 11, 2016 FINDINGS: The lungs are adequately inflated. Chronically increased lung markings are noted at the left lung base. The heart and pulmonary vascularity are normal. The mediastinum is normal in width. There is no pleural effusion. There are degenerative changes of the left shoulder. IMPRESSION: No definite acute cardiopulmonary abnormality. Coarse lung markings the left lung base are felt to be chronic. Electronically Signed   By: David  Martinique M.D.   On: 06/15/2017 14:56    EKG: Independently reviewed. Sinus. Nonspecific T wave changes. No ACS  Assessment/Plan Active Problems:   Hypothyroidism   Sepsis ([place])   Hypotension   Seizures (HCC)   HLD (hyperlipidemia)   PAF (paroxysmal atrial fibrillation) ([place])   Sepsis: secondary to urologic source w/ early pyelo. UA grossly abnormal. Per family hypertensive and tachycardic initially which responded well to fluids. Febrile to 101. Acute kidney. Noted creatinine of 1.37 with baseline 0.8. WBC 13.5. CXR unremarkable. - Continue sepsis protocols with IV fluids and antibiotics and cultures. - Narrow antibiotic coverage from vancomycin and Zosyn to Rocephin - Follow-up on lactic acid  Hypotension: Likely secondary to sepsis and dehydration as above in the setting of antihypertensive use.Marland Kitchen Responding well to IV fluids. EKG without signs of ACS. - Hold home antihypertensives - Continue IVF  AKI: Cr 1.37. Baseline 0.95.  - IVF, BMP in am  Seizures: no recent seizure: - continue keppra and Depakote  Hypothyroid: - contineu synthroid  HLD: - continue statin  PAF: no  controlling meds. EKG showing sinus - Tele - continue ASA  CHF: Echo showing EF 09% and mild diastolic dysfunction. No evidence of acute decompensation. - I's and O's, daily weights.  DVT prophylaxis: Hep  Code Status: full  Family Communication: none  Disposition Plan: pending improvement in condition  Consults called: none  Admission status: observation - SDU    MERRELL, DAVID J MD [facility]  If 7PM-7AM, please contact night-coverage [email or web address] Password [facility]  06/15/2017, 5:17 PM

## 2017-06-15 NOTE — ED Provider Notes (Addendum)
[facility] DEPT Provider Note   CSN: [id number] Arrival date & time: 06/15/17  1258     History   Chief Complaint No chief complaint on file.   HPI [name] is a 66 y.o. male.  HPI 66 year old male who presents with tachycardia. He has a history of paroxysmal atrial fibrillation on aspirin, seizure disorder, hypothyroidism, and hypertension. Reports that he was in his usual state of health today. States that he tripped on a rug today and fell on the right buttock. He did some difficulty getting up so called EMS. He did not hit his head or have loss of consciousness, and denies any syncope or near syncope. On EMS arrival they assisted him up and noted that he was tachycardic with heart rate in the 130s. It was recommended that he come to the ED for evaluation. He denies any chest pain, difficulty breathing, cough, fevers or chills, generalized weakness or fatigue, nausea or vomiting, diarrhea, or abdominal pain. Has noted slight dysuria over the past few days but no significant urinary frequency. States that he otherwise felt as if he was in his normal state of health if it was not for his tachycardia.  Past Medical History:  Diagnosis Date  . Abnormality of gait 08/17/2013  . Arthritis    left knee  . Asthma    "outgrew it from place I used to work at"  . Atrial flutter ([place])    with rapid conduction  . Cancer ([place])    skin ca to scalp, sees Dr. Tonia Brooms  . Cataract    bilateral  . Cervical myelopathy ([place])   . Cervical spondylosis with myelopathy 08/17/2013  . Gait disorder   . High cholesterol   . Hypertension   . Hypothyroidism   . Kidney stone    hx of  . Memory loss   . Paroxysmal atrial fibrillation ([facility])    chads2vasc score is 1  . Peripheral edema   . Rash, skin    to left lower leg   . Seizures ([place]) 1966   "started as gran mal; lessened to no loss of consciousness nowl last ~ 2000" patient sees Dr. Floyde Parkins  . Sleep apnea, obstructive    uses  cpap machine .9    Patient Active Problem List   Diagnosis Date Noted  . Atrial fibrillation ([place]) 04/14/2016  . Arrhythmia 01/11/2016  . Chest pain 01/11/2016  . Normocytic anemia 05/01/2015  . Acute renal failure ([place]) 04/30/2015  . Diarrhea 04/30/2015  . Cervical spondylosis with myelopathy 08/17/2013  . Abnormality of gait 08/17/2013  . Acute on chronic diastolic heart failure ([place]) 11/22/2011  . Dilantin toxicity 11/14/2011  . Dehydration 11/14/2011  . Dysarthria 11/14/2011  . Hypothermia 11/14/2011  . Hypothyroidism 11/14/2011  . HTN (hypertension), benign 11/14/2011  . Seizure disorder ([place]) 11/14/2011    Past Surgical History:  Procedure Laterality Date  . ANTERIOR CERVICAL DECOMP/DISCECTOMY FUSION  02/21/2012   Procedure: ANTERIOR CERVICAL DECOMPRESSION/DISCECTOMY FUSION 1 LEVEL;  Surgeon: Hosie Spangle, MD;  Location: [facility];  Service: Neurosurgery;  Laterality: N/A;  Cervical Five-Six anterior cervical decompression with fusion plating and bonegraft  . CATARACT EXTRACTION W/ INTRAOCULAR LENS  IMPLANT, BILATERAL  1990's  . CERVICAL DISCECTOMY  03/2011  . EYE SURGERY  1996-1998   "right eye; 4 times total; for detached retina"  . RETINAL DETACHMENT SURGERY     x 4 right eye       Home Medications    Prior to Admission  medications   Medication Sig Start Date End Date Taking? Authorizing Provider  aspirin EC 81 MG tablet Take 81 mg by mouth daily.      [provider]  atorvastatin (LIPITOR) 40 MG tablet Take 40 mg by mouth every evening.      [provider]  diltiazem (TIAZAC) 360 MG 24 hr capsule Take 1 capsule (360 mg total) by mouth daily. 04/12/17   Baldwin Jamaica, PA-C  hydrochlorothiazide (HYDRODIURIL) 12.5 MG tablet Take 12.5 mg by mouth daily. 01/12/17   [provider]  levETIRAcetam (KEPPRA) 750 MG tablet TAKE 1 TABLET (750 MG TOTAL) BY MOUTH 2 (TWO) TIMES DAILY. 09/09/16   Ward Givens, NP  levothyroxine  (SYNTHROID, LEVOTHROID) 150 MCG tablet Take 300-450 mcg by mouth daily before breakfast. Pt takes 450mg  on MWF - pt takes 300mg  all other days    [provider]  Multiple Vitamin (MULITIVITAMIN WITH MINERALS) TABS Take 1 tablet by mouth every morning.     [provider]  PHENobarbital (LUMINAL) 32.4 MG tablet take 1 tablet by mouth once daily 03/16/17   Ward Givens, NP  PHENobarbital (LUMINAL) 64.8 MG tablet take 2 tablets by mouth at bedtime 03/16/17   Ward Givens, NP    Family History Family History  Problem Relation Age of Onset  . Hypertension Mother   . Heart disease Mother 72  . Hypertension Father   . Glaucoma Father   . Sudden death Brother     Social History Social History  Substance Use Topics  . Smoking status: Never Smoker  . Smokeless tobacco: Never Used  . Alcohol use No     Allergies   Depakote [divalproex sodium] and Latex   Review of Systems Review of Systems  Constitutional: Negative for fatigue and fever.  Respiratory: Negative for cough and shortness of breath.   Cardiovascular: Negative for chest pain.  Gastrointestinal: Negative for abdominal pain.  Genitourinary: Positive for dysuria.  All other systems reviewed and are negative.    Physical Exam Updated Vital Signs BP (!) 119/59   Pulse 77   Temp 98.1 F (36.7 C) (Oral)   Resp 13   SpO2 94%   Physical Exam Physical Exam  Nursing note and vitals reviewed. Constitutional: Well developed, well nourished, non-toxic, and in no acute distress Head: Normocephalic and atraumatic.  Mouth/Throat: Oropharynx is clear and moist.  Neck: Normal range of motion. Neck supple.  Cardiovascular: Near tachycardic rate and regular rhythm.   Pulmonary/Chest: Effort normal and breath sounds normal.  Abdominal: Soft. There is no tenderness. There is no rebound and no guarding.  Musculoskeletal: Normal range of motion.  baseline bilateral lower extremity edema  Neurological: Alert,  no facial droop, fluent speech, moves all extremities symmetrically Skin: Skin is warm and dry.  Psychiatric: Cooperative   ED Treatments / Results  Labs (all labs ordered are listed, but only abnormal results are displayed) Labs Reviewed  CBC WITH DIFFERENTIAL/PLATELET - Abnormal; Notable for the following:       Result Value   WBC 13.5 (*)    Neutro Abs 11.7 (*)    All other components within normal limits  COMPREHENSIVE METABOLIC PANEL - Abnormal; Notable for the following:    Glucose, Bld 119 (*)    Creatinine, Ser 1.37 (*)    GFR calc non Af Amer 53 (*)    All other components within normal limits  URINALYSIS, ROUTINE W REFLEX MICROSCOPIC - Abnormal; Notable for the following:    APPearance HAZY (*)  Glucose, UA 50 (*)    Hgb urine dipstick MODERATE (*)    Protein, ur 100 (*)    Leukocytes, UA SMALL (*)    Bacteria, UA FEW (*)    Squamous Epithelial / LPF 0-5 (*)    All other components within normal limits  CULTURE, BLOOD (ROUTINE X 2)  CULTURE, BLOOD (ROUTINE X 2)  URINE CULTURE  I-STAT CG4 LACTIC ACID, ED    EKG  EKG Interpretation  Date/Time:  Wednesday June 15 2017 14:05:12 EDT Ventricular Rate:  94 PR Interval:    QRS Duration: 111 QT Interval:  347 QTC Calculation: 434 R Axis:   57 Text Interpretation:  Sinus rhythm Nonspecific T abnormalities, inferior leads wandering baseline difficult to read discard Confirmed by Brantley Stage [phone]) on 06/15/2017 3:12:48 PM       Radiology Dg Chest 2 View  Result Date: 06/15/2017 CLINICAL DATA:  Status post fall at home in the bathroom. Persistent right buttock pain. Patient has history of hypertension, hyperlipidemia, and atrial fibrillation. EXAM: CHEST  2 VIEW COMPARISON:  Chest x-ray and chest CT scan of January 11, 2016 FINDINGS: The lungs are adequately inflated. Chronically increased lung markings are noted at the left lung base. The heart and pulmonary vascularity are normal. The mediastinum is normal in  width. There is no pleural effusion. There are degenerative changes of the left shoulder. IMPRESSION: No definite acute cardiopulmonary abnormality. Coarse lung markings the left lung base are felt to be chronic. Electronically Signed   By: David  Martinique M.D.   On: 06/15/2017 14:56    Procedures Procedures (including critical care time) CRITICAL CARE Performed by: Forde Dandy   Total critical care time: 31 minutes  Critical care time was exclusive of separately billable procedures and treating other patients.  Critical care was necessary to treat or prevent imminent or life-threatening deterioration.  Critical care was time spent personally by me on the following activities: development of treatment plan with patient and/or surrogate as well as nursing, discussions with consultants, evaluation of patient's response to treatment, examination of patient, obtaining history from patient or surrogate, ordering and performing treatments and interventions, ordering and review of laboratory studies, ordering and review of radiographic studies, pulse oximetry and re-evaluation of patient's condition.  Medications Ordered in ED Medications  sodium chloride 0.9 % bolus 1,000 mL (not administered)  sodium chloride 0.9 % bolus 1,000 mL (not administered)  vancomycin (VANCOCIN) IVPB 1000 mg/200 mL premix (not administered)  piperacillin-tazobactam (ZOSYN) IVPB 3.375 g (not administered)  sodium chloride 0.9 % bolus 1,000 mL (0 mLs Intravenous Stopped 06/15/17 1550)  acetaminophen (TYLENOL) tablet 1,000 mg (1,000 mg Oral Given 06/15/17 1411)  sodium chloride 0.9 % bolus 1,000 mL (1,000 mLs Intravenous New Bag/Given 06/15/17 1551)     Initial Impression / Assessment and Plan / ED Course  I have reviewed the triage vital signs and the nursing notes.  Pertinent labs & imaging results that were available during my care of the patient were reviewed by me and considered in my medical decision making (see  chart for details).    Patient has improving tachycardia while here in the emergency department, but is febrile to 101. concern for sepsis. Has brief episode of hypotension with systolic blood pressure of 87/62. Mentation is normal. He also has a new 4 L oxygen requirement. Lungs appear clear. Chest x-ray visualized with reported possible scarring in the left lung base. Given urinary symptoms, UA suggestive of a potential UTI. Was empirically covered  with IV vancomycin and Zosyn. He has a mild leukocytosis. Lactate is currently pending. Blood cultures are also pending. Blood pressure improving with IV fluids. Plan to admit for ongoing treatment to hospitalist service.  Final Clinical Impressions(s) / ED Diagnoses   Final diagnoses:  Hypoxia  Acute cystitis without hematuria  Sepsis, due to unspecified organism [facility])    New Prescriptions New Prescriptions   No medications on file     Forde Dandy, MD 06/15/17 1626    Forde Dandy, MD 06/15/17 1640

## 2017-06-15 NOTE — ED Notes (Signed)
Ordered Reg house tray / per MD Marily Memos

## 2017-06-15 NOTE — ED Notes (Signed)
Pt from home for fall without injury. EMS noted pt to be tachycardic initially 130 presently 108. Pt denies complaints.  BP124/72 RR 16 HR 108 12 lead- NSR CBG 114 Sp02 94%/2L O2

## 2017-06-15 NOTE — Progress Notes (Signed)
Patient arrived to room 3E07 from ED.  Assessment complete, VS obtained, and Admission database began.

## 2017-06-15 NOTE — Progress Notes (Signed)
Pharmacy Antibiotic Note ZOLLIE ELLERY is a 66 y.o. male admitted on 06/15/2017 with UTI.  Pharmacy has been consulted for Ceftriaxone dosing.  Plan: 1. Ceftriaxone 1 gram IV every 24 hours 2. Will follow peripherally      Temp (24hrs), Avg:99.6 F (37.6 C), Min:98.1 F (36.7 C), Max:101 F (38.3 C)   Recent Labs Lab 06/15/17 1433 06/15/17 1753  WBC 13.5*  --   CREATININE 1.37*  --   LATICACIDVEN  --  0.88    CrCl cannot be calculated (Unknown ideal weight.).    Allergies  Allergen Reactions  . Depakote [Divalproex Sodium]     Intolerant  . Latex Rash    Rash and mild hives    Thank you for allowing pharmacy to be a part of this patient's care.  Vincenza Hews, PharmD, BCPS 06/15/2017, 6:22 PM

## 2017-06-15 NOTE — ED Notes (Signed)
Attempted report 

## 2017-06-16 ENCOUNTER — Observation Stay (HOSPITAL_COMMUNITY): Payer: Medicare Other

## 2017-06-16 ENCOUNTER — Encounter (HOSPITAL_COMMUNITY): Payer: Self-pay

## 2017-06-16 ENCOUNTER — Other Ambulatory Visit: Payer: Medicare Other | Admitting: Orthotics

## 2017-06-16 DIAGNOSIS — I5032 Chronic diastolic (congestive) heart failure: Secondary | ICD-10-CM

## 2017-06-16 DIAGNOSIS — N179 Acute kidney failure, unspecified: Secondary | ICD-10-CM | POA: Diagnosis not present

## 2017-06-16 DIAGNOSIS — R569 Unspecified convulsions: Secondary | ICD-10-CM | POA: Diagnosis not present

## 2017-06-16 DIAGNOSIS — A419 Sepsis, unspecified organism: Secondary | ICD-10-CM

## 2017-06-16 DIAGNOSIS — A6000 Herpesviral infection of urogenital system, unspecified: Secondary | ICD-10-CM | POA: Diagnosis not present

## 2017-06-16 DIAGNOSIS — I480 Paroxysmal atrial fibrillation: Secondary | ICD-10-CM | POA: Diagnosis not present

## 2017-06-16 LAB — BASIC METABOLIC PANEL
ANION GAP: 8 (ref 5–15)
BUN: 14 mg/dL (ref 6–20)
CO2: 23 mmol/L (ref 22–32)
Calcium: 8.2 mg/dL — ABNORMAL LOW (ref 8.9–10.3)
Chloride: 104 mmol/L (ref 101–111)
Creatinine, Ser: 1.16 mg/dL (ref 0.61–1.24)
GFR calc Af Amer: >60 mL/min (ref >60)
GLUCOSE: 91 mg/dL (ref 65–99)
POTASSIUM: 3.6 mmol/L (ref 3.5–5.1)
SODIUM: 135 mmol/L (ref 135–145)

## 2017-06-16 LAB — CBC
HEMATOCRIT: 38.5 % — AB (ref 39.0–52.0)
HEMOGLOBIN: 12.8 g/dL — AB (ref 13.0–17.0)
MCH: 30.6 pg (ref 26.0–34.0)
MCHC: 33.2 g/dL (ref 30.0–36.0)
MCV: 92.1 fL (ref 78.0–100.0)
Platelets: 131 10*3/uL — ABNORMAL LOW (ref 150–400)
RBC: 4.18 MIL/uL — AB (ref 4.22–5.81)
RDW: 13.7 % (ref 11.5–15.5)
WBC: 7 10*3/uL (ref 4.0–10.5)

## 2017-06-16 LAB — URINE CULTURE

## 2017-06-16 LAB — CG4 I-STAT (LACTIC ACID): LACTIC ACID, VENOUS: 1.55 mmol/L (ref 0.5–1.9)

## 2017-06-16 LAB — HIV ANTIBODY (ROUTINE TESTING W REFLEX): HIV Screen 4th Generation wRfx: NONREACTIVE

## 2017-06-16 MED ORDER — SENNOSIDES-DOCUSATE SODIUM 8.6-50 MG PO TABS
1.0000 | ORAL_TABLET | Freq: Two times a day (BID) | ORAL | Status: DC
  Administered 2017-06-16 – 2017-06-17 (×3): 1 via ORAL
  Filled 2017-06-16 (×3): qty 1

## 2017-06-16 MED ORDER — ASPIRIN EC 81 MG PO TBEC
81.0000 mg | DELAYED_RELEASE_TABLET | Freq: Every day | ORAL | Status: DC
  Administered 2017-06-16 (×2): 81 mg via ORAL
  Filled 2017-06-16 (×2): qty 1

## 2017-06-16 MED ORDER — PHENOBARBITAL 32.4 MG PO TABS
129.6000 mg | ORAL_TABLET | Freq: Every day | ORAL | Status: DC
  Administered 2017-06-16 (×2): 129.6 mg via ORAL
  Filled 2017-06-16 (×2): qty 4

## 2017-06-16 MED ORDER — PHENOBARBITAL 32.4 MG PO TABS
32.4000 mg | ORAL_TABLET | Freq: Every evening | ORAL | Status: DC
  Filled 2017-06-16: qty 1

## 2017-06-16 MED ORDER — DILTIAZEM HCL ER BEADS 240 MG PO CP24
360.0000 mg | ORAL_CAPSULE | Freq: Every day | ORAL | Status: DC
  Filled 2017-06-16 (×2): qty 1

## 2017-06-16 MED ORDER — POLYETHYLENE GLYCOL 3350 17 G PO PACK
17.0000 g | PACK | Freq: Every day | ORAL | Status: DC
  Administered 2017-06-16 – 2017-06-17 (×2): 17 g via ORAL
  Filled 2017-06-16 (×2): qty 1

## 2017-06-16 MED ORDER — VALACYCLOVIR HCL 500 MG PO TABS
500.0000 mg | ORAL_TABLET | Freq: Two times a day (BID) | ORAL | Status: DC
  Administered 2017-06-16 – 2017-06-17 (×3): 500 mg via ORAL
  Filled 2017-06-16 (×4): qty 1

## 2017-06-16 MED ORDER — ATORVASTATIN CALCIUM 40 MG PO TABS
40.0000 mg | ORAL_TABLET | Freq: Every evening | ORAL | Status: DC
  Administered 2017-06-16: 40 mg via ORAL
  Filled 2017-06-16 (×2): qty 1

## 2017-06-16 MED ORDER — LEVETIRACETAM 750 MG PO TABS
750.0000 mg | ORAL_TABLET | Freq: Two times a day (BID) | ORAL | Status: DC
  Administered 2017-06-16 – 2017-06-17 (×4): 750 mg via ORAL
  Filled 2017-06-16 (×5): qty 1

## 2017-06-16 MED ORDER — LEVOTHYROXINE SODIUM 75 MCG PO TABS
150.0000 ug | ORAL_TABLET | ORAL | Status: DC
  Administered 2017-06-17: 150 ug via ORAL

## 2017-06-16 MED ORDER — LEVOTHYROXINE SODIUM 100 MCG PO TABS
300.0000 ug | ORAL_TABLET | ORAL | Status: DC
  Administered 2017-06-16: 300 ug via ORAL
  Filled 2017-06-16 (×2): qty 3

## 2017-06-16 MED ORDER — LEVOTHYROXINE SODIUM 75 MCG PO TABS: 150.0000 ug | ORAL_TABLET | Freq: Every day | ORAL | Status: DC

## 2017-06-16 MED ORDER — PNEUMOCOCCAL VAC POLYVALENT 25 MCG/0.5ML IJ INJ
0.5000 mL | INJECTION | INTRAMUSCULAR | Status: AC
  Administered 2017-06-17: 0.5 mL via INTRAMUSCULAR
  Filled 2017-06-16: qty 0.5

## 2017-06-16 NOTE — Progress Notes (Signed)
Notified MD of patient asking to take PTA meds.

## 2017-06-16 NOTE — Progress Notes (Signed)
Pt returns from CT.

## 2017-06-16 NOTE — Progress Notes (Signed)
Patient gone to CT for renal stone workup

## 2017-06-16 NOTE — Progress Notes (Signed)
PROGRESS NOTE  [name] YBO:[id number] DOB: 05-04-1951 DOA: 06/15/2017 PCP: Lawerance Cruel, MD  HPI/Recap of past 24 hours:  Feeling better, fever subsided, he denies pain ,he reports genital herpes, reports being constipated Denies cough, no chest pain, no doe, no edema   Report Chronic back pain,   Assessment/Plan: Active Problems:   Hypothyroidism   Sepsis ([place])   Hypotension   Seizures ([facility])   HLD (hyperlipidemia)   PAF (paroxysmal atrial fibrillation) ([place])   Acute pyelonephritis   Sepsis presented on admission with fever 101,leukocytosis wbc 13.5, briefly hypotension responded to ivf and holding home meds cardizem  -source of infection pneumonia (ct chest with consolidation left lower lobe) /uti/ genital herpes -UA grossly abnormal with RBC TNTC, WBC TNTC, + bacteria, urine culture pending - blood culture no growth -he was started on vancomycin and Zosyn in the ED, abx changed to Rocephin on admission  Genital herpes with shallow ulcerx1 -h/o genital herpes in the past -start valtrax for recurrent genital herpes   AKI: Cr 1.37. Baseline 0.95.  -ua UA grossly abnormal with RBC TNTC, WBC TNTC, + bacteria, urine culture pending -will get CT renal stone , ( report h/o renal stone, currently denies flank pain) -cr improved with  IVF/abx -continue hold home meds HCTZ.   PAF:  -CHADS VAsc score 1  -EKG showing sinus -home meds cardizem held on admission due to hypotension, resume cardizem when bp improves - continue Tele - continue ASA 81mg    H/o diastolic CHF: Echo from 2778 showing EF 24% and mild diastolic dysfunction. No evidence of acute decompensation. - I's and O's, daily weights. -hctz held since admission, he is on gentle hydration, close monitor volume status  HLD: - continue statin  H/o Seizures: no recent seizure: - continue keppra and Depakote  Hypothyroid: - contineu synthroid  Obesity/ OSA Body mass index is 35.82 kg/m.    On cpap at home  Chronic back pain, FTT, PT eval  Constipation: stool softener  DVT prophylaxis: Hep  Code Status: full  Family Communication: none  Disposition Plan: hopefully in am if no fever, labs continue to improve Consults called: none    Procedures:  none  Antibiotics:  Vanc/zosyn x1 in the ED  Rocephin since admission   Objective: BP 129/80 (BP Location: Right Arm)   Pulse 62   Temp 97.7 F (36.5 C) (Oral)   Resp 18   Ht 6' (1.829 m)   Wt 119.8 kg (264 lb 1.6 oz)   SpO2 100%   BMI 35.82 kg/m   Intake/Output Summary (Last 24 hours) at 06/16/17 0754 Last data filed at 06/16/17 0735  Gross per 24 hour  Intake          1633.33 ml  Output                0 ml  Net          1633.33 ml   Filed Weights   06/15/17 2017 06/16/17 0442  Weight: 117.8 kg (259 lb 11.2 oz) 119.8 kg (264 lb 1.6 oz)    Exam:   General:  NAD  Cardiovascular: RRR  Respiratory: diminished at basis, no crackles, no wheezing, no rhonchi  Abdomen: Soft/ND/NT, positive BS  Musculoskeletal: No Edema  Neuro: aaox3  Data Reviewed: Basic Metabolic Panel:  Recent Labs Lab 06/15/17 1433 06/16/17 0449  NA 136 135  K 3.5 3.6  CL 101 104  CO2 25 23  GLUCOSE 119* 91  BUN 12 14  CREATININE 1.37* 1.16  CALCIUM 9.4 8.2*   Liver Function Tests:  Recent Labs Lab 06/15/17 1433  AST 38  ALT 21  ALKPHOS 123  BILITOT 0.6  PROT 6.6  ALBUMIN 3.5   No results for input(s): LIPASE, AMYLASE in the last 168 hours. No results for input(s): AMMONIA in the last 168 hours. CBC:  Recent Labs Lab 06/15/17 1433 06/16/17 0449  WBC 13.5* 7.0  NEUTROABS 11.7*  --   HGB 14.9 12.8*  HCT 43.3 38.5*  MCV 89.6 92.1  PLT 158 131*   Cardiac Enzymes:   No results for input(s): CKTOTAL, CKMB, CKMBINDEX, TROPONINI in the last 168 hours. BNP (last 3 results) No results for input(s): BNP in the last 8760 hours.  ProBNP (last 3 results) No results for input(s): PROBNP in the last  8760 hours.  CBG: No results for input(s): GLUCAP in the last 168 hours.  No results found for this or any previous visit (from the past 240 hour(s)).   Studies: Dg Chest 2 View  Result Date: 06/15/2017 CLINICAL DATA:  Status post fall at home in the bathroom. Persistent right buttock pain. Patient has history of hypertension, hyperlipidemia, and atrial fibrillation. EXAM: CHEST  2 VIEW COMPARISON:  Chest x-ray and chest CT scan of January 11, 2016 FINDINGS: The lungs are adequately inflated. Chronically increased lung markings are noted at the left lung base. The heart and pulmonary vascularity are normal. The mediastinum is normal in width. There is no pleural effusion. There are degenerative changes of the left shoulder. IMPRESSION: No definite acute cardiopulmonary abnormality. Coarse lung markings the left lung base are felt to be chronic. Electronically Signed   By: David  Martinique M.D.   On: 06/15/2017 14:56    Scheduled Meds: . aspirin EC  81 mg Oral QHS  . atorvastatin  40 mg Oral QPM  . heparin  5,000 Units Subcutaneous Q8H  . levETIRAcetam  750 mg Oral BID  . [START ON 06/17/2017] levothyroxine  150 mcg Oral Once per day on Mon Fri  . levothyroxine  300 mcg Oral Once per day on Sun Tue Wed Thu Sat  . phenobarbital  129.6 mg Oral QHS  . phenobarbital  32.4 mg Oral QPM  . [START ON 06/17/2017] pneumococcal 23 valent vaccine  0.5 mL Intramuscular Tomorrow-1000    Continuous Infusions: . sodium chloride 100 mL/hr at 06/15/17 1839  . cefTRIAXone (ROCEPHIN)  IV       Time spent: 91mins  Mileigh Tilley MD, PhD  [facility] Pager [phone]. If 7PM-7AM, please contact night-coverage at [email or web address], password [facility] 06/16/2017, 7:54 AM  LOS: 0 days

## 2017-06-16 NOTE — Evaluation (Signed)
Physical Therapy Evaluation Patient Details Name: [name] MRN: [id number] DOB: 07/02/1951 Today's Date: 06/16/2017   History of Present Illness  Pt is a 66 yo male admitted through ED on 06/15/17 following a fall when he was getting out of his shower. Pt has had increased weakness and was found to be tachycardic and had a BP of 80/60 on admission. Pt was diagnosed with sepsis, hypotension and AKI. PMH significant for arthritis, asthma, Atrial flutter, CA, cervical myelopathy s/p 2 surgeries, HLD, HTN, hypothyroid, nephrolithiasis, PAF, siezures, sleep apnea.   Clinical Impression  Pt presents with the above diagnosis and below deficits for therapy evaluation. Prior to admission, pt lived alone in a single level home and received assistance from his brother-in-law to assist with MD appointments and groceries. Pt requires Min guard for all mobility this session with RW and is expected to quickly progress to Mod I by discharge. Pt will benefit from continued PT follow-up in order to ensure progression and assist with safe return home.     Follow Up Recommendations No PT follow up    Equipment Recommendations  None recommended by PT    Recommendations for Other Services       Precautions / Restrictions Precautions Precautions: Fall Restrictions Weight Bearing Restrictions: No      Mobility  Bed Mobility Overal bed mobility: Modified Independent             General bed mobility comments: able to get EOB with HOB elevated, minimal use of rails and no hand-on assistance  Transfers Overall transfer level: Needs assistance Equipment used: Rolling walker (2 wheeled) Transfers: Sit to/from Stand Sit to Stand: Min guard         General transfer comment: Min guard for safety with cues for hand placement   Ambulation/Gait Ambulation/Gait assistance: Min guard Ambulation Distance (Feet): 350 Feet Assistive device: Rolling walker (2 wheeled) Gait Pattern/deviations:  Step-through pattern;Decreased stride length Gait velocity: decreased Gait velocity interpretation: Below normal speed for age/gender General Gait Details: cues for proximity to RW. Pt with goo seqeuncing and minimal shuffeling noted with gait. No LOB or hands on assist needed for majority of gait. Min guard for Scientist, research (medical)    Modified Rankin (Stroke Patients Only)       Balance Overall balance assessment: Needs assistance Sitting-balance support: No upper extremity supported;Feet supported Sitting balance-Leahy Scale: Good     Standing balance support: No upper extremity supported Standing balance-Leahy Scale: Fair                               Pertinent Vitals/Pain Pain Assessment: No/denies pain    Home Living Family/patient expects to be discharged to:: Private residence Living Arrangements: Alone Available Help at Discharge: Family;Available PRN/intermittently Type of Home: House Home Access: Stairs to enter Entrance Stairs-Rails: Psychiatric nurse of Steps: 4 Home Layout: One level Home Equipment: Cane - single point;Shower seat;Walker - 2 wheels;Hand held shower head      Prior Function Level of Independence: Independent;Independent with assistive device(s)         Comments: uses cane when he goes out. Independent around home. brother in law assists with md appointment and grocery shopping     Hand Dominance   Dominant Hand: Right    Extremity/Trunk Assessment   Upper Extremity Assessment Upper Extremity Assessment: Overall WFL for tasks assessed  Lower Extremity Assessment Lower Extremity Assessment: Generalized weakness    Cervical / Trunk Assessment Cervical / Trunk Assessment: Normal  Communication   Communication: No difficulties  Cognition Arousal/Alertness: Awake/alert Behavior During Therapy: WFL for tasks assessed/performed Overall Cognitive Status: Within  Functional Limits for tasks assessed                                        General Comments      Exercises     Assessment/Plan    PT Assessment Patient needs continued PT services  PT Problem List Decreased activity tolerance;Decreased balance;Decreased mobility;Decreased knowledge of use of DME       PT Treatment Interventions DME instruction;Gait training;Stair training;Functional mobility training;Therapeutic activities;Therapeutic exercise;Balance training    PT Goals (Current goals can be found in the Care Plan section)  Acute Rehab PT Goals Patient Stated Goal: to get back home soon PT Goal Formulation: With patient Time For Goal Achievement: 06/30/17 Potential to Achieve Goals: Good    Frequency Min 3X/week   Barriers to discharge        Co-evaluation               AM-PAC PT "6 Clicks" Daily Activity  Outcome Measure Difficulty turning over in bed (including adjusting bedclothes, sheets and blankets)?: None Difficulty moving from lying on back to sitting on the side of the bed? : None Difficulty sitting down on and standing up from a chair with arms (e.g., wheelchair, bedside commode, etc,.)?: Total Help needed moving to and from a bed to chair (including a wheelchair)?: A Little Help needed walking in hospital room?: A Little Help needed climbing 3-5 steps with a railing? : A Little 6 Click Score: 18    End of Session Equipment Utilized During Treatment: Gait belt Activity Tolerance: Patient tolerated treatment well Patient left: in chair;with call bell/phone within reach Nurse Communication: Mobility status PT Visit Diagnosis: History of falling (Z91.81);Difficulty in walking, not elsewhere classified (R26.2)    Time: 1914-7829 PT Time Calculation (min) (ACUTE ONLY): 29 min   Charges:   PT Evaluation $PT Eval Moderate Complexity: 1 Procedure PT Treatments $Gait Training: 8-22 mins   PT G Codes:   PT G-Codes **NOT FOR  INPATIENT CLASS** Functional Assessment Tool Used: AM-PAC 6 Clicks Basic Mobility;Clinical judgement Functional Limitation: Mobility: Walking and moving around Mobility: Walking and Moving Around Current Status (F6213): At least 40 percent but less than 60 percent impaired, limited or restricted Mobility: Walking and Moving Around Goal Status [phone]): At least 1 percent but less than 20 percent impaired, limited or restricted    Scheryl Marten PT, DPT  [phone]   Shanon Rosser 06/16/2017, 4:14 PM

## 2017-06-17 DIAGNOSIS — N179 Acute kidney failure, unspecified: Secondary | ICD-10-CM

## 2017-06-17 DIAGNOSIS — I480 Paroxysmal atrial fibrillation: Secondary | ICD-10-CM | POA: Diagnosis not present

## 2017-06-17 DIAGNOSIS — A6000 Herpesviral infection of urogenital system, unspecified: Secondary | ICD-10-CM | POA: Diagnosis not present

## 2017-06-17 DIAGNOSIS — A419 Sepsis, unspecified organism: Secondary | ICD-10-CM | POA: Diagnosis not present

## 2017-06-17 LAB — BASIC METABOLIC PANEL
Anion gap: 7 (ref 5–15)
BUN: 11 mg/dL (ref 6–20)
CHLORIDE: 104 mmol/L (ref 101–111)
CO2: 27 mmol/L (ref 22–32)
CREATININE: 1 mg/dL (ref 0.61–1.24)
Calcium: 8.7 mg/dL — ABNORMAL LOW (ref 8.9–10.3)
Glucose, Bld: 89 mg/dL (ref 65–99)
POTASSIUM: 3.8 mmol/L (ref 3.5–5.1)
SODIUM: 138 mmol/L (ref 135–145)

## 2017-06-17 LAB — CBC
HEMATOCRIT: 37.8 % — AB (ref 39.0–52.0)
HEMOGLOBIN: 12.5 g/dL — AB (ref 13.0–17.0)
MCH: 29.9 pg (ref 26.0–34.0)
MCHC: 33.1 g/dL (ref 30.0–36.0)
MCV: 90.4 fL (ref 78.0–100.0)
PLATELETS: 144 10*3/uL — AB (ref 150–400)
RBC: 4.18 MIL/uL — ABNORMAL LOW (ref 4.22–5.81)
RDW: 13.3 % (ref 11.5–15.5)
WBC: 6.1 10*3/uL (ref 4.0–10.5)

## 2017-06-17 LAB — MAGNESIUM: MAGNESIUM: 2.1 mg/dL (ref 1.7–2.4)

## 2017-06-17 MED ORDER — AMOXICILLIN-POT CLAVULANATE 875-125 MG PO TABS: 1.0000 | ORAL_TABLET | Freq: Two times a day (BID) | ORAL | 0 refills | Status: AC

## 2017-06-17 MED ORDER — DILTIAZEM HCL ER COATED BEADS 180 MG PO CP24
360.0000 mg | ORAL_CAPSULE | Freq: Every day | ORAL | Status: DC
  Administered 2017-06-17: 360 mg via ORAL
  Filled 2017-06-17: qty 2

## 2017-06-17 MED ORDER — POLYETHYLENE GLYCOL 3350 17 G PO PACK: 17.0000 g | PACK | Freq: Every day | ORAL | 0 refills | Status: DC

## 2017-06-17 MED ORDER — SENNOSIDES-DOCUSATE SODIUM 8.6-50 MG PO TABS: 1.0000 | ORAL_TABLET | Freq: Every day | ORAL | 0 refills | Status: DC

## 2017-06-17 MED ORDER — HYDROCHLOROTHIAZIDE 12.5 MG PO TABS
12.5000 mg | ORAL_TABLET | ORAL | 0 refills | Status: DC
Start: 2017-06-17 — End: 2018-09-18

## 2017-06-17 MED ORDER — VALACYCLOVIR HCL 500 MG PO TABS: 500.0000 mg | ORAL_TABLET | Freq: Two times a day (BID) | ORAL | 0 refills | Status: AC

## 2017-06-17 MED ORDER — AMOXICILLIN-POT CLAVULANATE 875-125 MG PO TABS: 1.0000 | ORAL_TABLET | Freq: Two times a day (BID) | ORAL | Status: DC

## 2017-06-17 NOTE — Progress Notes (Signed)
Patient given discharge instructions and all questions answered.  

## 2017-06-17 NOTE — Progress Notes (Signed)
Call placed to [facility] to notify of telemetry monitoring d/c. Pt c/a/ox3, VS WNL.

## 2017-06-17 NOTE — Discharge Summary (Signed)
Discharge Summary  [name] OEV:[id number] DOB: 02/28/1951  PCP: Lawerance Cruel, MD  Admit date: 06/15/2017 Discharge date: 06/17/2017  Time spent: <76mins  Recommendations for Outpatient Follow-up:  1. F/u with PMD within a week  for hospital discharge follow up, repeat cbc/bmp at follow up, pmd to schedule repeat ct chest in three months to access bilateral lung base consolidation  Discharge Diagnoses:  Active Hospital Problems   Diagnosis Date Noted  . Sepsis ([place]) 06/15/2017  . Hypotension 06/15/2017  . Seizures ([place]) 06/15/2017  . HLD (hyperlipidemia) 06/15/2017  . PAF (paroxysmal atrial fibrillation) ([place]) 06/15/2017  . Acute pyelonephritis 06/15/2017  . Hypothyroidism 11/14/2011    Resolved Hospital Problems   Diagnosis Date Noted Date Resolved  No resolved problems to display.    Discharge Condition: stable  Diet recommendation: heart healthy  Filed Weights   06/15/17 2017 06/16/17 0442 06/17/17 0441  Weight: 117.8 kg (259 lb 11.2 oz) 119.8 kg (264 lb 1.6 oz) 121.1 kg (266 lb 14.4 oz)    History of present illness:  PCP: Lawerance Cruel, MD Patient coming from: Home   Chief Complaint: Fall and weakness.   HPI: [name] is a 66 y.o. male with medical history significant of arthritis, asthma, Aflutter, Cancer, cervical myelopathy, HLD, HTN, Hypothyroidism, Nephrolithiasis, memory loss, PAF, seizures, objective sleep apnea. Patient reports being in his home state of health until the morning of admission when he fell coming out of the shower. Patient reports taking a shower and having difficulty with holding his soap another sharp products. In an attempt to get out of the shower he reports having his feet getting caught up in the bath rug and falling to the ground striking his left buttock on the ground. Patient denies any head trauma or loss of consciousness. Patient was able to get to the phone and call EMS who brought patient to the  emergency room. In the field patient was noted to be tachycardic with heart rate in the 130s and to have suboptimal blood pressures in the 80/60 range. Patient denies any recent dysuria, frequency, flank pain, vomiting, chest pain, shortness breath, palpitations, fevers, seizures, focal neurological deficits.   ED Course: Sepsis protocol initiated. Patient start broad-spectrum antibiotics.  Hospital Course:  Active Problems:   Hypothyroidism   Sepsis ([place])   Hypotension   Seizures (H[facility])   HLD (hyperlipidemia)   PAF (paroxysmal atrial fibrillation) (H[facility])   Acute pyelonephritis   Sepsis presented on admission with fever 101,leukocytosis wbc 13.5, briefly hypotension responded to ivf and holding home meds cardizem  -source of infection pneumonia (ct chest with consolidation left lower lobe) /uti/ genital herpes -UA grossly abnormal with RBC TNTC, WBC TNTC, + bacteria, urine culture no growth - blood culture no growth -he was started on vancomycin and Zosyn in the ED, abx changed to Rocephin on admission, he has improved, he is discharged on augmentin to finish course.   Ct chest : "Atelectasis versus consolidation LEFT lower lobe. 2 somewhat nodular areas of opacity in the posterior and posteromedial RIGHT lower lobe, could represent atelectasis, infiltrate or mass; no additional pulmonary masses or nodules Identified. Followup CT chest recommended in 3 months to assess persistence." Patient denies cough, no chest pain, no hypoxia,  He is to follow up with pmd to repeat ct chest in three months. He is discharged on augmentin  Genital herpes with shallow ulcerx1 -h/o genital herpes in the past -start valtrax for recurrent genital herpes -he is discharge on valtrax  500mg  bid for three days to finish treatment course,  -patient is advised to discuss with pmd for prophylaxis, due to h/o frequent flares up.   AKI: Cr 1.37. Baseline 0.95.  -ua UA grossly abnormal with RBC TNTC, WBC  TNTC, + bacteria, urine culture pending -will get CT renal stone , ( report h/o renal stone, currently denies flank pain) -cr improved with  IVF/abx -home meds HCTZ held in the hospital, he is discharged on HCTZ at  reduced frequency, he take HCTZ daily prior to hospitalization, he is discharged on HCTZ every MWF   PAF:  -CHADS VAsc score 1  -EKG tele showing sinus rhythm -home meds cardizem held on admission due to hypotension, resumed cardizem when bp improves -   continue ASA 81mg   He is followed by cardiology  H/o diastolic CHF: Echo from 5784 showing EF 69% and mild diastolic dysfunction. No evidence of acute decompensation. - I's and O's, daily weights. -hctz held since admission, he is on gentle hydration, close monitor volume status, he is euvolemic at discharge -he is discharged on HCTZ at  reduced frequency, he take HCTZ daily prior to hospitalization, he is discharged on HCTZ every MWF  HLD: - continue statin  H/o Seizures: no recent seizure: - continue keppra and Depakote  Hypothyroid: - contineu synthroid  Obesity/ OSA Body mass index is 35.82 kg/m.  On cpap at home  Chronic back pain, FTT, PT eval  Recommended "No PT follow up"  Constipation: stool softener  DVT prophylaxis while in the hospital:Hep Code Status:full Family Communication:none Disposition Plan:home Consults called:none   Procedures:  none  Antibiotics:  Vanc/zosyn x1 in the ED  Rocephin since admission   Discharge Exam: BP (!) 140/93   Pulse 64   Temp 98 F (36.7 C) (Oral)   Resp 18   Ht 6' (1.829 m)   Wt 121.1 kg (266 lb 14.4 oz)   SpO2 96%   BMI 36.20 kg/m   General: NAD Cardiovascular: RRR Respiratory: diminished at basis, no wheezing, no rhonchi, no rales Extremity: no edema  Discharge Instructions You were cared for by a hospitalist during your hospital stay. If you have any questions about your discharge medications or the care you  received while you were in the hospital after you are discharged, you can call the unit and asked to speak with the hospitalist on call if the hospitalist that took care of you is not available. Once you are discharged, your primary care physician will handle any further medical issues. Please note that NO REFILLS for any discharge medications will be authorized once you are discharged, as it is imperative that you return to your primary care physician (or establish a relationship with a primary care physician if you do not have one) for your aftercare needs so that they can reassess your need for medications and monitor your lab values.  Discharge Instructions    Diet - low sodium heart healthy    Complete by:  As directed    Increase activity slowly    Complete by:  As directed      Allergies as of 06/17/2017      Reactions   Depakote [divalproex Sodium]    Intolerant   Latex Rash   Rash and mild hives      Medication List    TAKE these medications   amoxicillin-clavulanate 875-125 MG tablet Commonly known as:  AUGMENTIN Take 1 tablet by mouth every 12 (twelve) hours.   aspirin EC 81 MG  tablet Take 81 mg by mouth at bedtime.   atorvastatin 40 MG tablet Commonly known as:  LIPITOR Take 40 mg by mouth every evening.   diltiazem 360 MG 24 hr capsule Commonly known as:  TIAZAC Take 1 capsule (360 mg total) by mouth daily.   hydrochlorothiazide 12.5 MG tablet Commonly known as:  HYDRODIURIL Take 1 tablet (12.5 mg total) by mouth every Monday, Wednesday, and Friday. What changed:  when to take this   levETIRAcetam 750 MG tablet Commonly known as:  KEPPRA TAKE 1 TABLET (750 MG TOTAL) BY MOUTH 2 (TWO) TIMES DAILY.   levothyroxine 150 MCG tablet Commonly known as:  SYNTHROID, LEVOTHROID Take 150-300 mcg by mouth daily before breakfast. Pt takes 300mg  on tues. wed, thurs. Sat and sunday  Monday and Friday 150 Mg   multivitamin with minerals Tabs tablet Take 1 tablet by mouth  every morning.   nystatin powder Generic drug:  nystatin Apply 100,000 Units topically daily. After shower   PHENobarbital 64.8 MG tablet Commonly known as:  LUMINAL take 2 tablets by mouth at bedtime What changed:  See the new instructions.   PHENobarbital 32.4 MG tablet Commonly known as:  LUMINAL take 1 tablet by mouth once daily What changed:  See the new instructions.   polyethylene glycol packet Commonly known as:  MIRALAX / GLYCOLAX Take 17 g by mouth daily.   senna-docusate 8.6-50 MG tablet Commonly known as:  Senokot-S Take 1 tablet by mouth at bedtime.   valACYclovir 500 MG tablet Commonly known as:  VALTREX Take 1 tablet (500 mg total) by mouth 2 (two) times daily.      Allergies  Allergen Reactions  . Depakote [Divalproex Sodium]     Intolerant  . Latex Rash    Rash and mild hives   Follow-up Information    Lawerance Cruel, MD On 06/23/2017.   Specialty:  Family Medicine Why:  hospital discharge follow up FOLLOW UP @3pm  with PA, repeat cbc/bmp at follow up, PMD to arrange Followup CT chest in 3 months to assess consolidation in bilateral lung basis. Contact information: [place] [address] [phone]            The results of significant diagnostics from this hospitalization (including imaging, microbiology, ancillary and laboratory) are listed below for reference.    Significant Diagnostic Studies: Dg Chest 2 View  Result Date: 06/15/2017 CLINICAL DATA:  Status post fall at home in the bathroom. Persistent right buttock pain. Patient has history of hypertension, hyperlipidemia, and atrial fibrillation. EXAM: CHEST  2 VIEW COMPARISON:  Chest x-ray and chest CT scan of January 11, 2016 FINDINGS: The lungs are adequately inflated. Chronically increased lung markings are noted at the left lung base. The heart and pulmonary vascularity are normal. The mediastinum is normal in width. There is no pleural effusion. There are  degenerative changes of the left shoulder. IMPRESSION: No definite acute cardiopulmonary abnormality. Coarse lung markings the left lung base are felt to be chronic. Electronically Signed   By: David  Martinique M.D.   On: 06/15/2017 14:56   Ct Chest Wo Contrast  Result Date: 06/16/2017 CLINICAL DATA:  Abnormal CT abdomen exam with nodular foci at the lung bases EXAM: CT CHEST WITHOUT CONTRAST TECHNIQUE: Multidetector CT imaging of the chest was performed following the standard protocol without IV contrast. Sagittal and coronal MPR images reconstructed from axial data set. COMPARISON:  Chest radiograph 06/15/2017, CT abdomen and pelvis 06/16/2017, CT chest 01/11/2016 FINDINGS: Cardiovascular: Aorta  upper normal caliber 3.7 cm transverse. Atherosclerotic calcifications in coronary arteries and minimally in aorta. No pericardial effusion. Mediastinum/Nodes: No thoracic adenopathy. Base of cervical region unremarkable. Esophagus unremarkable. Lungs/Pleura: Significant atelectasis versus consolidation LEFT lower lobe. Small peripheral opacities in the posterior and posteromedial RIGHT lower lobe are identified as on earlier CT exam, uncertain if represent mass or atelectasis, measuring 17 x 18 mm image 117 and 12 by 12 mm image 116. Minimal peribronchial thickening. Linear subsegmental laterally in RIGHT lower lobe. Remaining lungs clear. No additional infiltrate, pleural effusion or pneumothorax. Upper Abdomen: Distended stomach. Remaining visualized upper abdomen unremarkable, please see earlier CT abdomen/pelvis exam. Musculoskeletal: RIGHT glenohumeral degenerative changes. Degenerative disc disease changes thoracic spine. No acute thoracic osseous lesions. IMPRESSION: Atelectasis versus consolidation LEFT lower lobe. 2 somewhat nodular areas of opacity in the posterior and posteromedial RIGHT lower lobe, could represent atelectasis, infiltrate or mass; no additional pulmonary masses or nodules identified. Followup  CT chest recommended in 3 months to assess persistence. Coronary artery calcifications. Aortic Atherosclerosis (ICD10-I70.0). Electronically Signed   By: Lavonia Dana M.D.   On: 06/16/2017 14:19   Ct Renal Stone Study  Result Date: 06/16/2017 CLINICAL DATA:  Urinary tract infection, hematuria, history of kidney stones EXAM: CT ABDOMEN AND PELVIS WITHOUT CONTRAST TECHNIQUE: Multidetector CT imaging of the abdomen and pelvis was performed following the standard protocol without IV contrast. COMPARISON:  None. FINDINGS: Lower chest: There is opacity at the left lung base which could be due to elevation of the left hemidiaphragm and subsequent atelectasis. Pneumonia however cannot be excluded with some air bronchograms present within the left lower lobe posterior medially. Also some nodularity is noted within the right lower lobe with 1 area measuring 14 mm in diameter on image number 11 series 6. CT of the chest is recommended to assess for possible primary or metastatic malignancy. Hepatobiliary: The liver is unremarkable in the unenhanced state. No ductal dilatation is seen. No calcified gallstones are noted. Pancreas: The pancreas appears somewhat fatty infiltrated. No pancreatic ductal dilatation is seen. Spleen: The spleen is unremarkable. Adrenals/Urinary Tract: The adrenal glands appear normal. No renal calculi are seen. No hydronephrosis is noted. The ureters do not appear to be dilated. The urinary bladder is moderately distended with urine with no abnormality evident. Stomach/Bowel: The stomach is not well distended but no abnormality is seen. No small bowel distention is noted. The colon is not well distended, with feces throughout. The terminal ileum and the appendix are unremarkable. Vascular/Lymphatic: The abdominal aorta is normal in caliber with mild abdominal aortic atherosclerosis present. No adenopathy is seen. Reproductive: The prostate is normal in size for age. Other: None Musculoskeletal:  There are degenerative changes particularly throughout the facet joints of the entire lumbar spine. Degenerative disc disease is noted at L5-S1. No compression deformity is seen. There are degenerative changes also noted within the hips. IMPRESSION: 1. No explanation for the patient's hematuria is seen. No renal or ureteral calculi are noted. 2. Nodular lesions in the right lower lobe posterior medially with airspace disease in the left lower lobe as well. Recommend CT the chest to assess further. Primary or metastatic malignancy cannot be excluded. 3. Mild abdominal aortic atherosclerosis. 4. Degenerative change throughout the facet joints of the entire lumbar spine. Degenerative disc disease at L5-S1. Electronically Signed   By: Ivar Drape M.D.   On: 06/16/2017 09:06    Microbiology: Recent Results (from the past 240 hour(s))  Blood culture (routine x 2)  Status: None (Preliminary result)   Collection Time: 06/15/17  2:33 PM  Result Value Ref Range Status   Specimen Description BLOOD RIGHT HAND  Final   Special Requests   Final    BOTTLES DRAWN AEROBIC AND ANAEROBIC Blood Culture adequate volume   Culture NO GROWTH < 24 HOURS  Final   Report Status PENDING  Incomplete  Blood culture (routine x 2)     Status: None (Preliminary result)   Collection Time: 06/15/17  2:33 PM  Result Value Ref Range Status   Specimen Description BLOOD BLOOD LEFT FOREARM  Final   Special Requests   Final    BOTTLES DRAWN AEROBIC AND ANAEROBIC Blood Culture adequate volume   Culture NO GROWTH < 24 HOURS  Final   Report Status PENDING  Incomplete  Urine culture     Status: Abnormal   Collection Time: 06/15/17  3:51 PM  Result Value Ref Range Status   Specimen Description URINE, CLEAN CATCH  Final   Special Requests NONE  Final   Culture MULTIPLE SPECIES PRESENT, SUGGEST RECOLLECTION (A)  Final   Report Status 06/16/2017 FINAL  Final     Labs: Basic Metabolic Panel:  Recent Labs Lab 06/15/17 1433  06/16/17 0449 06/17/17 0336  NA 136 135 138  K 3.5 3.6 3.8  CL 101 104 104  CO2 25 23 27   GLUCOSE 119* 91 89  BUN 12 14 11   CREATININE 1.37* 1.16 1.00  CALCIUM 9.4 8.2* 8.7*  MG  --   --  2.1   Liver Function Tests:  Recent Labs Lab 06/15/17 1433  AST 38  ALT 21  ALKPHOS 123  BILITOT 0.6  PROT 6.6  ALBUMIN 3.5   No results for input(s): LIPASE, AMYLASE in the last 168 hours. No results for input(s): AMMONIA in the last 168 hours. CBC:  Recent Labs Lab 06/15/17 1433 06/16/17 0449 06/17/17 0336  WBC 13.5* 7.0 6.1  NEUTROABS 11.7*  --   --   HGB 14.9 12.8* 12.5*  HCT 43.3 38.5* 37.8*  MCV 89.6 92.1 90.4  PLT 158 131* 144*   Cardiac Enzymes: No results for input(s): CKTOTAL, CKMB, CKMBINDEX, TROPONINI in the last 168 hours. BNP: BNP (last 3 results) No results for input(s): BNP in the last 8760 hours.  ProBNP (last 3 results) No results for input(s): PROBNP in the last 8760 hours.  CBG: No results for input(s): GLUCAP in the last 168 hours.     SignedFlorencia Reasons MD, PhD  [facility] 06/17/2017, 11:20 AM

## 2017-06-20 LAB — CULTURE, BLOOD (ROUTINE X 2)
CULTURE: NO GROWTH
Culture: NO GROWTH
SPECIAL REQUESTS: ADEQUATE
SPECIAL REQUESTS: ADEQUATE

## 2017-06-21 ENCOUNTER — Other Ambulatory Visit: Payer: Medicare Other | Admitting: Orthotics

## 2017-07-05 ENCOUNTER — Ambulatory Visit (INDEPENDENT_AMBULATORY_CARE_PROVIDER_SITE_OTHER): Payer: Medicare Other | Admitting: Orthotics

## 2017-07-05 DIAGNOSIS — M2142 Flat foot [pes planus] (acquired), left foot: Secondary | ICD-10-CM

## 2017-07-05 DIAGNOSIS — E1159 Type 2 diabetes mellitus with other circulatory complications: Secondary | ICD-10-CM | POA: Diagnosis not present

## 2017-07-05 DIAGNOSIS — M21969 Unspecified acquired deformity of unspecified lower leg: Secondary | ICD-10-CM | POA: Diagnosis not present

## 2017-07-05 DIAGNOSIS — M2141 Flat foot [pes planus] (acquired), right foot: Secondary | ICD-10-CM | POA: Diagnosis not present

## 2017-07-26 NOTE — Progress Notes (Signed)

## 2017-09-15 ENCOUNTER — Other Ambulatory Visit: Payer: Self-pay | Admitting: Adult Health

## 2017-09-15 ENCOUNTER — Encounter: Payer: Self-pay | Admitting: Neurology

## 2017-09-15 ENCOUNTER — Encounter (INDEPENDENT_AMBULATORY_CARE_PROVIDER_SITE_OTHER): Payer: Self-pay

## 2017-09-15 ENCOUNTER — Ambulatory Visit (INDEPENDENT_AMBULATORY_CARE_PROVIDER_SITE_OTHER): Payer: Medicare Other | Admitting: Neurology

## 2017-09-15 VITALS — BP 149/89 | HR 73 | Ht 72.0 in | Wt 272.5 lb

## 2017-09-15 DIAGNOSIS — Z5181 Encounter for therapeutic drug level monitoring: Secondary | ICD-10-CM

## 2017-09-15 DIAGNOSIS — G40909 Epilepsy, unspecified, not intractable, without status epilepticus: Secondary | ICD-10-CM | POA: Diagnosis not present

## 2017-09-15 DIAGNOSIS — R269 Unspecified abnormalities of gait and mobility: Secondary | ICD-10-CM | POA: Diagnosis not present

## 2017-09-15 MED ORDER — PHENOBARBITAL 32.4 MG PO TABS: ORAL_TABLET | ORAL | 1 refills | Status: DC

## 2017-09-15 MED ORDER — PHENOBARBITAL 64.8 MG PO TABS: ORAL_TABLET | ORAL | 1 refills | Status: DC

## 2017-09-15 MED ORDER — LEVETIRACETAM 750 MG PO TABS: ORAL_TABLET | ORAL | 3 refills | Status: DC

## 2017-09-15 NOTE — Progress Notes (Signed)
Faxed rx phenobarbital 64.8mg  tablet and 32.4 mg tablet to pt pharmacy at [phone]. Received fax confirmation.

## 2017-09-15 NOTE — Progress Notes (Signed)
Reason for visit: Seizures  [name] is an 66 y.o. male  History of present illness:  [name] is a 66 year old right-handed right-handed white male with a history of a seizure disorder that has been well controlled with the use of Keppra and phenobarbital.  The patient has not had any seizures since last seen 1 year ago.  He is not operating a motor vehicle however.  The patient has a cervical myelopathy, he has had prior surgical decompression previously.  He walks with a cane.  He denies any recent falls.  Overall, he is doing well.  He returns to this office for further evaluation.  Past Medical History:  Diagnosis Date  . Abnormality of gait 08/17/2013  . Arthritis    left knee  . Asthma    "outgrew it from place I used to work at"  . Atrial flutter ([place])    with rapid conduction  . Cancer ([place])    skin ca to scalp, sees Dr. Tonia Brooms  . Cataract    bilateral  . Cervical myelopathy ([place])   . Cervical spondylosis with myelopathy 08/17/2013  . Chronic diastolic CHF (congestive heart failure) ([place])   . Gait disorder   . High cholesterol   . Hypertension   . Hypothyroidism   . Kidney stone    hx of  . Memory loss   . Paroxysmal atrial fibrillation ([facility])    chads2vasc score is 1  . Peripheral edema   . Rash, skin    to left lower leg   . Seizures ([place]) 1966   "started as gran mal; lessened to no loss of consciousness nowl last ~ 2000" patient sees Dr. Floyde Parkins  . Sleep apnea, obstructive    uses cpap machine .9    Past Surgical History:  Procedure Laterality Date  . ANTERIOR CERVICAL DECOMP/DISCECTOMY FUSION  02/21/2012   Procedure: ANTERIOR CERVICAL DECOMPRESSION/DISCECTOMY FUSION 1 LEVEL;  Surgeon: Hosie Spangle, MD;  Location: [facility];  Service: Neurosurgery;  Laterality: N/A;  Cervical Five-Six anterior cervical decompression with fusion plating and bonegraft  . CATARACT EXTRACTION W/ INTRAOCULAR LENS  IMPLANT, BILATERAL  1990's  . CERVICAL DISCECTOMY   03/2011  . EYE SURGERY  1996-1998   "right eye; 4 times total; for detached retina"  . RETINAL DETACHMENT SURGERY     x 4 right eye    Family History  Problem Relation Age of Onset  . Hypertension Mother   . Heart disease Mother 70  . Hypertension Father   . Glaucoma Father   . Sudden death Brother     Social history:  reports that he has never smoked. He has never used smokeless tobacco. He reports that he does not drink alcohol or use drugs.    Allergies  Allergen Reactions  . Depakote [Divalproex Sodium]     Intolerant  . Latex Rash    Rash and mild hives    Medications:  Prior to Admission medications   Medication Sig Start Date End Date Taking? Authorizing Provider  aspirin EC 81 MG tablet Take 81 mg by mouth at bedtime.    Yes [provider]  atorvastatin (LIPITOR) 40 MG tablet Take 40 mg by mouth every evening.     Yes [provider]  diltiazem (TIAZAC) 360 MG 24 hr capsule Take 1 capsule (360 mg total) by mouth daily. 04/12/17  Yes Baldwin Jamaica, PA-C  hydrochlorothiazide (HYDRODIURIL) 12.5 MG tablet Take 1 tablet (12.5 mg total) by mouth  every Monday, Wednesday, and Friday. 06/17/17  Yes Florencia Reasons, MD  levETIRAcetam (KEPPRA) 750 MG tablet TAKE 1 TABLET (750 MG TOTAL) BY MOUTH 2 (TWO) TIMES DAILY. 09/09/16  Yes Ward Givens, NP  levothyroxine (SYNTHROID, LEVOTHROID) 150 MCG tablet Take 150-300 mcg by mouth daily before breakfast. Pt takes 300mg  on tues. wed, thurs. Sat and sunday  Monday and Friday 150 Mg   Yes [provider]  Multiple Vitamin (MULITIVITAMIN WITH MINERALS) TABS Take 1 tablet by mouth every morning.    Yes [provider]  NYSTATIN powder Apply 100,000 Units topically daily. After shower 04/25/17  Yes [provider]  PHENobarbital (LUMINAL) 32.4 MG tablet take 1 tablet by mouth once daily Patient taking differently: take 32.4 mg tablet by mouth in the evening 03/16/17  Yes Ward Givens, NP    PHENobarbital (LUMINAL) 64.8 MG tablet take 2 tablets by mouth at bedtime Patient taking differently: take 129.6 mg by mouth at bedtime 03/16/17  Yes [place], Jinny Blossom, NP  polyethylene glycol (MIRALAX / GLYCOLAX) packet Take 17 g by mouth daily. 06/17/17  Yes Florencia Reasons, MD  senna-docusate (SENOKOT-S) 8.6-50 MG tablet Take 1 tablet by mouth at bedtime. 06/17/17  Yes Florencia Reasons, MD    ROS:  Out of a complete 14 system review of symptoms, the patient complains only of the following symptoms, and all other reviewed systems are negative.  Back pain Numbness, seizures  Blood pressure (!) 149/89, pulse 73, height 6' (1.829 m), weight 272 lb 8 oz (123.6 kg).  Physical Exam  General: The patient is alert and cooperative at the time of the examination.  Skin: 3+ edema below the knees are seen bilaterally.   Neurologic Exam  Mental status: The patient is alert and oriented x 3 at the time of the examination. The patient has apparent normal recent and remote memory, with an apparently normal attention span and concentration ability.   Cranial nerves: Facial symmetry is present. Speech is normal, no aphasia or dysarthria is noted. Extraocular movements are full. Visual fields are full.  Motor: The patient has good strength in all 4 extremities, with exception of some weakness of the intrinsic muscles of the hands bilaterally, and the patient has mild bilateral foot drops.  Sensory examination: Soft touch sensation is symmetric on the face, arms, and legs.  Coordination: The patient has good finger-nose-finger and heel-to-shin bilaterally.  Gait and station: The patient has a wide-based, slightly unsteady gait.  The patient walks with a cane.  Tandem gait was not attempted.  Romberg is negative.  Reflexes: Deep tendon reflexes are symmetric, but are depressed.   Assessment/Plan:  1.  History of seizures  2.  Gait disorder  3.  Cervical myelopathy  The patient is stable with the seizures,  he will have blood work done today.  A prescription was sent in for his phenobarbital prescriptions and for his Keppra.  He will follow-up in 1 year, sooner if needed.  Jill Alexanders MD 09/15/2017 2:03 PM  [facility] [address] [place], [place] [address]  Phone [phone] Fax [phone]

## 2017-09-17 LAB — COMPREHENSIVE METABOLIC PANEL
A/G RATIO: 1.7 (ref 1.2–2.2)
ALBUMIN: 4.4 g/dL (ref 3.6–4.8)
ALT: 11 IU/L (ref 0–44)
AST: 18 IU/L (ref 0–40)
Alkaline Phosphatase: 143 IU/L — ABNORMAL HIGH (ref 39–117)
BILIRUBIN TOTAL: 0.5 mg/dL (ref 0.0–1.2)
BUN / CREAT RATIO: 8 — AB (ref 10–24)
BUN: 7 mg/dL — AB (ref 8–27)
CALCIUM: 10 mg/dL (ref 8.6–10.2)
CHLORIDE: 98 mmol/L (ref 96–106)
CO2: 26 mmol/L (ref 20–29)
Creatinine, Ser: 0.89 mg/dL (ref 0.76–1.27)
GFR, EST AFRICAN AMERICAN: 104 mL/min/{1.73_m2} (ref >59)
GFR, EST NON AFRICAN AMERICAN: 90 mL/min/{1.73_m2} (ref >59)
GLUCOSE: 78 mg/dL (ref 65–99)
Globulin, Total: 2.6 g/dL (ref 1.5–4.5)
Potassium: 4.5 mmol/L (ref 3.5–5.2)
Sodium: 140 mmol/L (ref 134–144)
TOTAL PROTEIN: 7 g/dL (ref 6.0–8.5)

## 2017-09-17 LAB — CBC WITH DIFFERENTIAL/PLATELET
BASOS ABS: 0 10*3/uL (ref 0.0–0.2)
Basos: 0 %
EOS (ABSOLUTE): 0.1 10*3/uL (ref 0.0–0.4)
Eos: 2 %
Hematocrit: 43.7 % (ref 37.5–51.0)
Hemoglobin: 14.3 g/dL (ref 13.0–17.7)
Immature Grans (Abs): 0 10*3/uL (ref 0.0–0.1)
Immature Granulocytes: 0 %
LYMPHS ABS: 1.1 10*3/uL (ref 0.7–3.1)
Lymphs: 21 %
MCH: 30.5 pg (ref 26.6–33.0)
MCHC: 32.7 g/dL (ref 31.5–35.7)
MCV: 93 fL (ref 79–97)
MONOCYTES: 13 %
MONOS ABS: 0.7 10*3/uL (ref 0.1–0.9)
Neutrophils Absolute: 3.5 10*3/uL (ref 1.4–7.0)
Neutrophils: 64 %
PLATELETS: 240 10*3/uL (ref 150–379)
RBC: 4.69 x10E6/uL (ref 4.14–5.80)
RDW: 12.9 % (ref 12.3–15.4)
WBC: 5.4 10*3/uL (ref 3.4–10.8)

## 2017-09-17 LAB — PHENOBARBITAL LEVEL: PHENOBARBITAL, SERUM: 28 ug/mL (ref 15–40)

## 2017-09-17 LAB — LEVETIRACETAM LEVEL: Levetiracetam Lvl: 17.4 ug/mL (ref 10.0–40.0)

## 2017-09-19 ENCOUNTER — Telehealth: Payer: Self-pay | Admitting: *Deleted

## 2017-09-19 NOTE — Telephone Encounter (Signed)
Called and spoke with patient. Advised labs unremarkable with exception of mild elevation in alkaline phosphatase, likely related to phenobarbital usage. Phenobarbital level therapeutic. Blood count normal per CW,MD. Pt verbalized understanding.

## 2017-09-19 NOTE — Telephone Encounter (Signed)
-----   Message from Kathrynn Ducking, MD sent at 09/18/2017  4:41 PM EDT ----- Blood work is unremarkable with exception of mild elevation in alkaline phosphatase, likely related to use of phenobarbital.  No change in dosing, phenobarbital level is therapeutic.  Blood count is normal. ----- Message ----- From: Interface, [facility] Lab Results In Sent: 09/16/2017   7:43 AM To: Kathrynn Ducking, MD

## 2018-01-19 ENCOUNTER — Other Ambulatory Visit: Payer: Self-pay | Admitting: *Deleted

## 2018-01-19 MED ORDER — LEVETIRACETAM 500 MG PO TABS: ORAL_TABLET | ORAL | 5 refills | Status: DC

## 2018-01-20 ENCOUNTER — Telehealth: Payer: Self-pay | Admitting: Neurology

## 2018-01-20 NOTE — Telephone Encounter (Signed)
Pt called stating levETIRAcetam (KEPPRA) 500 MG tablet is on back order till April and will need a different prescription sent in to [facility]

## 2018-01-20 NOTE — Telephone Encounter (Signed)
FYI Pt has called right back to inform that [facility] stated the Keppra is there and he no longer needs a different prescription. No call back requested

## 2018-01-20 NOTE — Telephone Encounter (Signed)
Noted, thank you

## 2018-03-20 ENCOUNTER — Other Ambulatory Visit: Payer: Self-pay | Admitting: *Deleted

## 2018-03-20 ENCOUNTER — Telehealth: Payer: Self-pay | Admitting: Internal Medicine

## 2018-03-20 MED ORDER — DILTIAZEM HCL ER BEADS 360 MG PO CP24: 360.0000 mg | ORAL_CAPSULE | Freq: Every day | ORAL | 0 refills | Status: DC

## 2018-03-20 NOTE — Telephone Encounter (Signed)
New message:       *STAT* If patient is at the pharmacy, call can be transferred to refill team.   1. Which medications need to be refilled? (please list name of each medication and dose if known) diltiazem (TIAZAC) 360 MG 24 hr capsule  2. Which pharmacy/location (including street and city if local pharmacy) is medication to be sent to?[id number] - [address], [address] AT [place]  3. Do they need a 30 day or 90 day supply? [place]

## 2018-03-27 ENCOUNTER — Other Ambulatory Visit: Payer: Self-pay | Admitting: Neurology

## 2018-03-27 MED ORDER — PHENOBARBITAL 32.4 MG PO TABS: ORAL_TABLET | ORAL | 1 refills | Status: DC

## 2018-03-27 MED ORDER — PHENOBARBITAL 64.8 MG PO TABS: ORAL_TABLET | ORAL | 1 refills | Status: DC

## 2018-03-27 NOTE — Telephone Encounter (Signed)
Pt request refill for PHENobarbital (LUMINAL) 64.8 MG tablet and PHENobarbital (LUMINAL) 32.4 MG tablet sent [facility]

## 2018-04-24 ENCOUNTER — Other Ambulatory Visit: Payer: Self-pay | Admitting: Physician Assistant

## 2018-05-18 ENCOUNTER — Other Ambulatory Visit: Payer: Self-pay

## 2018-05-18 ENCOUNTER — Other Ambulatory Visit: Payer: Self-pay | Admitting: Physician Assistant

## 2018-05-18 MED ORDER — DILTIAZEM HCL ER BEADS 360 MG PO CP24
ORAL_CAPSULE | ORAL | 0 refills | Status: DC
Start: 2018-05-18 — End: 2018-06-13

## 2018-06-13 ENCOUNTER — Other Ambulatory Visit: Payer: Self-pay

## 2018-06-13 ENCOUNTER — Telehealth: Payer: Self-pay | Admitting: Physician Assistant

## 2018-06-13 MED ORDER — DILTIAZEM HCL ER BEADS 360 MG PO CP24: ORAL_CAPSULE | ORAL | 1 refills | Status: DC

## 2018-06-13 NOTE — Telephone Encounter (Signed)
New Message        *STAT* If patient is at the pharmacy, call can be transferred to refill team.   1. Which medications need to be refilled? (please list name of each medication and dose if known) Diltiazem 360 mg  2. Which pharmacy/location (including street and city if local pharmacy) is medication to be sent to? [facility] [facility] [phone]  3. Do they need a 30 day or 90 day supply? 90   Patient has an appt.09/04

## 2018-06-20 ENCOUNTER — Other Ambulatory Visit: Payer: Self-pay

## 2018-06-20 MED ORDER — DILTIAZEM HCL ER BEADS 360 MG PO CP24: ORAL_CAPSULE | ORAL | 1 refills | Status: DC

## 2018-07-19 ENCOUNTER — Encounter: Payer: Self-pay | Admitting: Physician Assistant

## 2018-07-26 ENCOUNTER — Ambulatory Visit: Payer: Medicare Other | Admitting: Physician Assistant

## 2018-08-21 ENCOUNTER — Other Ambulatory Visit: Payer: Self-pay | Admitting: Physician Assistant

## 2018-09-04 ENCOUNTER — Other Ambulatory Visit: Payer: Self-pay | Admitting: Physician Assistant

## 2018-09-04 ENCOUNTER — Other Ambulatory Visit: Payer: Self-pay | Admitting: Neurology

## 2018-09-04 NOTE — Progress Notes (Signed)
Cardiology Office Note Date:  09/04/2018  Patient ID:  [name], [name] [birth year], MRN [id number] PCP:  Lawerance Cruel, MD  Cardiologist/Electrophysiologist: Dr. Rayann Heman    Chief Complaint:  Planned visit  History of Present Illness: TACARI REPASS is a 67 y.o. male with history of new PAFib/flutter found at his hospital stay in Feb 2017, WCT, HTN, seizure d/o.  He was admitted to [facility] 01/11/16 with c/o CP, noted to have WCT by EMS, that resolved upon insertion of his IV, this also resolved his symptom of CP, Dr. Rayann Heman, who noted unfortunately,there was not a 12 lead of the [facility] and could not therefore determine whether this was SVT or VT. Interestingly, the cycle length (225 msec) is identical to subsequent atrial flutter cycle length and felt is therefore possible that this arrhythmia was atrial flutter with 1:1 conduction. As the arrhythmia was hemodynamically stable, I am not certain that we would treat them differently. His echocardiogram was with normal LVEF , hs cardiac markers were negative for ACS, and felt his CP likely secondary to the tachycardia, was decided to treat him with Diltiazem and follow up out patient. His D dimer was elevated with negative CT chest for PE, his CXR felt not to be pneumonia. He has seizure disorder and gait disturbance, it was felt at this time, to hold off on full anticoagulation with CHA2DS2Vasc score of one.  He comes today being seen for Dr. Rayann Heman, last seen by him in May 2017, at that time doing well, no changes were made with plans to see APP q 39months.   I saw him in May 2018, he again told me he had not had any kind of recurrent CP or palpitations, no SOB, no dizziness, near syncope or syncope.   He reported chronic swelling b/l LE for years, remarked the left always worse, and reported he had a few years prior had several tests, including circulation without clear cause.  He wears orthopedic shoes for an unclear problem with his  feet, and walked with the aid of cane secondary to chronic back problems and some degree of gait instability.  He has not pursued re-evaluation for his hx of OSA/new CPAP machine recommended at a prior visit. At this visit he was again urged to revisit sleep study/OSA evaluation/treatment, he was working on ongoing weight loss.  Mentioned at 67 years old his CHA2DS2Vasc would be 2, and re-discussion on a/c would need to be considered.  The patient feels like he is at his baseline. Denies any CP, no palpitations, no SOB, no symptoms of PND or orthopnea.  His ambulation is limited with chronic knee and back pain.  He denies symptoms of PND or orthopnea. He reports his last fall 6 years ago, this was felt 2/2 his Dilantin and his medicines were adjusted.  He thinks his last seizure was about 3 years ago.  Reports initially his seizures were "gand mal" though his last was left arm shaking and a shaking/"rumbling type movement" of his abdominal muscles.  He walks with the aid of cane, says he needs to be mindful of his ambulation but does well.  He denies any bleeding or hematological history.  Hehas hx of a syncopal episode felt 2/2 dehydration associated with GI illness/UTI, 2+ years ago, not since.  He remains quite edematous, this again he reports as chronic for years, and unchanged.  No pulmonary symptoms, dates well before his last echos.  Past Medical History:  Diagnosis Date  .  Abnormality of gait 08/17/2013  . Arthritis    left knee  . Asthma    "outgrew it from place I used to work at"  . Atrial flutter ([place])    with rapid conduction  . Cancer ([place])    skin ca to scalp, sees Dr. Tonia Brooms  . Cataract    bilateral  . Cervical myelopathy ([place])   . Cervical spondylosis with myelopathy 08/17/2013  . Chronic diastolic CHF (congestive heart failure) ([place])   . Gait disorder   . High cholesterol   . Hypertension   . Hypothyroidism   . Kidney stone    hx of  . Memory loss   . Paroxysmal atrial  fibrillation (HCC)    chads2vasc score is 1  . Peripheral edema   . Rash, skin    to left lower leg   . Seizures ([place]) 1966   "started as gran mal; lessened to no loss of consciousness nowl last ~ 2000" patient sees Dr. Floyde Parkins  . Sleep apnea, obstructive    uses cpap machine .9    Past Surgical History:  Procedure Laterality Date  . ANTERIOR CERVICAL DECOMP/DISCECTOMY FUSION  02/21/2012   Procedure: ANTERIOR CERVICAL DECOMPRESSION/DISCECTOMY FUSION 1 LEVEL;  Surgeon: Hosie Spangle, MD;  Location: [place] [facility];  Service: Neurosurgery;  Laterality: N/A;  Cervical Five-Six anterior cervical decompression with fusion plating and bonegraft  . CATARACT EXTRACTION W/ INTRAOCULAR LENS  IMPLANT, BILATERAL  1990's  . CERVICAL DISCECTOMY  03/2011  . EYE SURGERY  1996-1998   "right eye; 4 times total; for detached retina"  . RETINAL DETACHMENT SURGERY     x 4 right eye    Current Outpatient Medications  Medication Sig Dispense Refill  . aspirin EC 81 MG tablet Take 81 mg by mouth at bedtime.     Marland Kitchen atorvastatin (LIPITOR) 40 MG tablet Take 40 mg by mouth every evening.      . diltiazem (TIAZAC) 360 MG 24 hr capsule TAKE (1) CAPSULE DAILY. 30 capsule 0  . hydrochlorothiazide (HYDRODIURIL) 12.5 MG tablet Take 1 tablet (12.5 mg total) by mouth every Monday, Wednesday, and Friday. 30 tablet 0  . levETIRAcetam (KEPPRA) 500 MG tablet Take 1.5 tablets twice daily by mouth 90 tablet 5  . levETIRAcetam (KEPPRA) 750 MG tablet TAKE 1 TABLET (750 MG TOTAL) BY MOUTH 2 (TWO) TIMES DAILY. 180 tablet 3  . levothyroxine (SYNTHROID, LEVOTHROID) 150 MCG tablet Take 150-300 mcg by mouth daily before breakfast. Pt takes 300mg  on tues. wed, thurs. Sat and sunday  Monday and Friday 150 Mg    . Multiple Vitamin (MULITIVITAMIN WITH MINERALS) TABS Take 1 tablet by mouth every morning.     Marland Kitchen NYSTATIN powder Apply 100,000 Units topically daily. After shower    . PHENobarbital (LUMINAL) 32.4 MG tablet take 32.4 mg  tablet by mouth in the evening 90 tablet 1  . PHENobarbital (LUMINAL) 64.8 MG tablet take 129.6 mg by mouth at bedtime 180 tablet 1  . polyethylene glycol (MIRALAX / GLYCOLAX) packet Take 17 g by mouth daily. 14 each 0  . senna-docusate (SENOKOT-S) 8.6-50 MG tablet Take 1 tablet by mouth at bedtime. 30 tablet 0   No current facility-administered medications for this visit.     Allergies:   Depakote [divalproex sodium] and Latex   Social History:  The patient  reports that he has never smoked. He has never used smokeless tobacco. He reports that he does not drink alcohol or use drugs.   Family  History:  The patient's family history includes Glaucoma in his father; Heart disease (age of onset: 7) in his mother; Hypertension in his father and mother; Sudden death in his brother.  ROS:  Please see the history of present illness.  All other systems are reviewed and otherwise negative.   PHYSICAL EXAM:  VS:  There were no vitals taken for this visit. BMI: There is no height or weight on file to calculate BMI. Well nourished, obese, tall, well developed, in no acute distress  HEENT: normocephalic, atraumatic  Neck: no JVD, carotid bruits or masses Cardiac:   RRR; no significant murmurs, no rubs, or gallops Lungs:  CTA b/l, no wheezing, rhonchi or rales  Abd: soft, nontender MS: no deformity or atrophy Ext: significant brawny type edema, some pitting appreciated R/L Skin: warm and dry, no rash Neuro:  No gross deficits appreciated Psych: euthymic mood, full affect   EKG:  SR 93bpm, appears similar to prior  March 2017, 30day EM Predominant rhythm is sinus Rare atrial fibrillation with rapid ventricular rates and aberrancy are noted. Very rapidly conducting atrial flutter is also noted No significant brady arrhythmias are noted No AV block or pauses  01/28/16: Lexiscan stress test Study Highlights     Nuclear stress EF: 63%.  There was no ST segment deviation noted during  stress.  No T wave inversion was noted during stress.  Defect 1: There is a small defect present in the apex location.  The study is normal.  This is a low risk study.  The left ventricular ejection fraction is normal (55-65%).   There is a small defect present in the apex location. The defect is consistent with artifact.    Overall Study Impression Myocardial perfusion is normal. The study is normal. This is a low risk study. Overall left ventricular systolic function was normal. LV cavity size is normal. Nuclear stress EF: 63%. The left ventricular ejection fraction is normal (55-65%). There is no prior study for comparison.     01/11/16: Echocardiogram Study Conclusions  Left ventricle: The cavity size was normal. Wall thickness was  increased in a pattern of mild LVH. Systolic function was normal.  The estimated ejection fraction was in the range of 60% to 65%.  Wall motion was normal; there were no regional wall motion  abnormalities    Recent Labs: 09/15/2017: ALT 11; BUN 7; Creatinine, Ser 0.89; Hemoglobin 14.3; Platelets 240; Potassium 4.5; Sodium 140  No results found for requested labs within last 8760 hours.   CrCl cannot be calculated (Patient's most recent lab result is older than the maximum 21 days allowed.).   Wt Readings from Last 3 Encounters:  09/15/17 272 lb 8 oz (123.6 kg)  06/17/17 266 lb 14.4 oz (121.1 kg)  04/12/17 272 lb (123.4 kg)     Other studies reviewed: Additional studies/records reviewed today include: summarized above  ASSESSMENT AND PLAN:  1. New AFib/flutter     CHA2DS2Vasc is now 2, historically with a score of one, not on a/c therapy with hx of seizure d/o, gait instability/back issues     EM noted rare RAFib/flutter w/abberrancy on diltiazem     No symptoms   Lengthy discussion with the patient given change inhis CHA2DS2Vasc score and with it, indication for a/c.  Discussed potential risks/benefits of anticoagulation as well  at length.   He is inclined to not want to take the blood thinners, though would like to know Dr. Jackalyn Lombard thoughts  2. CP  Not a recurrent complaint    negative stress test  3. WCT       Suspected to be AFlutter with 1:1 conduction, no 12 lead of the WCT      nml LVEF, negative stress test for any ischemia  4. HTN     Looks OK, no changes  5. Marked brawny type edema, some pitting appreciated today     Patient reports years unchanged and historically extensive w/u     Not wearing his support stockings today, reports some days are to hard to get on     Encouraged use, and elevation of legs when seated     Continue lasix  Disposition:  Will get labs today, CBC/BMET, I will forward note to Dr. Rayann Heman, and discuss with him.  For now 3 month follow up pending this.    Current medicines are reviewed at length with the patient today.  The patient did not have any concerns regarding medicines.  Haywood Lasso, PA-C 09/04/2018 3:14 PM     [facility] [address] [place] [address] [phone] (office)  [phone] (fax)

## 2018-09-05 ENCOUNTER — Ambulatory Visit: Payer: Medicare Other | Admitting: Physician Assistant

## 2018-09-05 VITALS — BP 128/80 | HR 93 | Ht 72.0 in | Wt 299.0 lb

## 2018-09-05 DIAGNOSIS — I872 Venous insufficiency (chronic) (peripheral): Secondary | ICD-10-CM

## 2018-09-05 DIAGNOSIS — I10 Essential (primary) hypertension: Secondary | ICD-10-CM | POA: Diagnosis not present

## 2018-09-05 DIAGNOSIS — I480 Paroxysmal atrial fibrillation: Secondary | ICD-10-CM | POA: Diagnosis not present

## 2018-09-05 NOTE — Patient Instructions (Addendum)
Medication Instructions:   Your physician recommends that you continue on your current medications as directed. Please refer to the Current Medication list given to you today.   If you need a refill on your cardiac medications before your next appointment, please call your pharmacy.  Labwork: CBC AND BMET TODAY    Testing/Procedures: NONE ORDERED  TODAY    Follow-Up:  IN 3 MONTHS WITH URSUY     Any Other Special Instructions Will Be Listed Below (If Applicable).

## 2018-09-06 ENCOUNTER — Other Ambulatory Visit: Payer: Self-pay | Admitting: Physician Assistant

## 2018-09-06 LAB — CBC
HEMATOCRIT: 44.9 % (ref 37.5–51.0)
Hemoglobin: 15.1 g/dL (ref 13.0–17.7)
MCH: 29.8 pg (ref 26.6–33.0)
MCHC: 33.6 g/dL (ref 31.5–35.7)
MCV: 89 fL (ref 79–97)
Platelets: 224 10*3/uL (ref 150–450)
RBC: 5.06 x10E6/uL (ref 4.14–5.80)
RDW: 12.1 % — AB (ref 12.3–15.4)
WBC: 5.6 10*3/uL (ref 3.4–10.8)

## 2018-09-06 LAB — BASIC METABOLIC PANEL
BUN / CREAT RATIO: 8 — AB (ref 10–24)
BUN: 8 mg/dL (ref 8–27)
CO2: 24 mmol/L (ref 20–29)
Calcium: 9.7 mg/dL (ref 8.6–10.2)
Chloride: 102 mmol/L (ref 96–106)
Creatinine, Ser: 0.97 mg/dL (ref 0.76–1.27)
GFR calc Af Amer: 94 mL/min/{1.73_m2} (ref >59)
GFR, EST NON AFRICAN AMERICAN: 82 mL/min/{1.73_m2} (ref >59)
GLUCOSE: 81 mg/dL (ref 65–99)
POTASSIUM: 4.2 mmol/L (ref 3.5–5.2)
SODIUM: 142 mmol/L (ref 134–144)

## 2018-09-12 ENCOUNTER — Telehealth: Payer: Self-pay | Admitting: Physician Assistant

## 2018-09-12 NOTE — Telephone Encounter (Signed)
Called to follow up with the patient after our visit last week, to inform the patient that in discussion with Dr. Rayann Heman, will not pursue anticoagulation at this time. Given no changes to his medicines, we can move his follow up to 6 months, sooner if needed.  Tommye Standard, PA-C

## 2018-09-13 ENCOUNTER — Telehealth: Payer: Self-pay | Admitting: Physician Assistant

## 2018-09-13 NOTE — Telephone Encounter (Signed)
Follow Up;    Patient returning call from yesterday.

## 2018-09-13 NOTE — Telephone Encounter (Signed)
SPOKE WITH PT TO LET HIM KNOW  URSUY  SPOKE WITH DR Rayann Heman AND THERE WILL BE NO ANTICOAGULATION THERAPY AT THIS TIME AND TO CONTINUE ON HIS CURRENT MEDICINES. HIS APPT FOLLOW UP CAN BE PUSHED OUT TO 6 MONTHS.  PT WAS AGREEABLE TO DR. ALLRED FEEDBACK ABOUT ANTICOAGULATION AND TO 6 MONTH FOLLOW UP. RECALL PLACED IN Epic.

## 2018-09-18 ENCOUNTER — Ambulatory Visit: Payer: Medicare Other | Admitting: Neurology

## 2018-09-18 ENCOUNTER — Other Ambulatory Visit: Payer: Self-pay

## 2018-09-18 ENCOUNTER — Encounter: Payer: Self-pay | Admitting: Neurology

## 2018-09-18 VITALS — BP 138/88 | HR 72 | Resp 20 | Ht 72.0 in | Wt 290.0 lb

## 2018-09-18 DIAGNOSIS — Z5181 Encounter for therapeutic drug level monitoring: Secondary | ICD-10-CM

## 2018-09-18 MED ORDER — LEVETIRACETAM 500 MG PO TABS: ORAL_TABLET | ORAL | 3 refills | Status: AC

## 2018-09-18 NOTE — Progress Notes (Signed)
Reason for visit: Seizures  [name] is an 67 y.o. male  History of present illness:  [name] is a 67 year old right-handed white male with a history of a cervical myelopathy and a history of seizures.  The patient has had no seizures since last seen, he still has a residual gait disorder from his cervical myelopathy.  He lives alone, his wife passed away 4 or 5 years ago.  The patient has not had any falls, he uses a cane when he is outside of the house.  He is sleeping well, he denies significant issues controlling the bowels or the bladder.  He is on phenobarbital and Keppra, he tolerates these medications well.  Past Medical History:  Diagnosis Date  . Abnormality of gait 08/17/2013  . Arthritis    left knee  . Asthma    "outgrew it from place I used to work at"  . Atrial flutter ([place])    with rapid conduction  . Cancer ([place])    skin ca to scalp, sees Dr. Tonia Brooms  . Cataract    bilateral  . Cervical myelopathy ()   . Cervical spondylosis with myelopathy 08/17/2013  . Chronic diastolic CHF (congestive heart failure) ([place])   . Gait disorder   . High cholesterol   . Hypertension   . Hypothyroidism   . Kidney stone    hx of  . Memory loss   . Paroxysmal atrial fibrillation (HCC)    chads2vasc score is 1  . Peripheral edema   . Rash, skin    to left lower leg   . Seizures ([place]) 1966   "started as gran mal; lessened to no loss of consciousness nowl last ~ 2000" patient sees Dr. Floyde Parkins  . Sleep apnea, obstructive    uses cpap machine .9    Past Surgical History:  Procedure Laterality Date  . ANTERIOR CERVICAL DECOMP/DISCECTOMY FUSION  02/21/2012   Procedure: ANTERIOR CERVICAL DECOMPRESSION/DISCECTOMY FUSION 1 LEVEL;  Surgeon: Hosie Spangle, MD;  Location: [place] [facility];  Service: Neurosurgery;  Laterality: N/A;  Cervical Five-Six anterior cervical decompression with fusion plating and bonegraft  . CATARACT EXTRACTION W/ INTRAOCULAR LENS  IMPLANT,  BILATERAL  1990's  . CERVICAL DISCECTOMY  03/2011  . EYE SURGERY  1996-1998   "right eye; 4 times total; for detached retina"  . RETINAL DETACHMENT SURGERY     x 4 right eye    Family History  Problem Relation Age of Onset  . Hypertension Mother   . Heart disease Mother 1  . Hypertension Father   . Glaucoma Father   . Sudden death Brother     Social history:  reports that he has never smoked. He has never used smokeless tobacco. He reports that he does not drink alcohol or use drugs.    Allergies  Allergen Reactions  . Depakote [Divalproex Sodium]     Intolerant  . Latex Rash    Rash and mild hives    Medications:  Prior to Admission medications   Medication Sig Start Date End Date Taking? Authorizing Provider  aspirin EC 81 MG tablet Take 81 mg by mouth at bedtime.    Yes [provider]  atorvastatin (LIPITOR) 40 MG tablet Take 40 mg by mouth every evening.     Yes [provider]  diltiazem (TIAZAC) 360 MG 24 hr capsule TAKE (1) CAPSULE DAILY. 09/07/18  Yes Baldwin Jamaica, PA-C  furosemide (LASIX) 40 MG tablet Take 40 mg by  mouth daily. 06/13/18  Yes [provider]  levETIRAcetam (KEPPRA) 500 MG tablet Take 1.5 tablets twice daily by mouth 01/19/18  Yes Kathrynn Ducking, MD  levothyroxine (SYNTHROID, LEVOTHROID) 150 MCG tablet Take 300 mcg by mouth daily before breakfast.    Yes [provider]  Multiple Vitamin (MULITIVITAMIN WITH MINERALS) TABS Take 1 tablet by mouth every morning.    Yes [provider]  NYSTATIN powder Apply 100,000 Units topically daily. After shower 04/25/17  Yes [provider]  PHENobarbital (LUMINAL) 32.4 MG tablet TAKE ONE TABLET AT BEDTIME. 09/04/18  Yes Kathrynn Ducking, MD  PHENobarbital (LUMINAL) 64.8 MG tablet TAKE 2 TABLETS AT BEDTIME. 09/04/18  Yes Kathrynn Ducking, MD  polyethylene glycol [facility] / GLYCOLAX) packet Take 17 g by mouth daily. 06/17/17  Yes Florencia Reasons, MD    senna-docusate (SENOKOT-S) 8.6-50 MG tablet Take 1 tablet by mouth at bedtime. 06/17/17  Yes Florencia Reasons, MD    ROS:  Out of a complete 14 system review of symptoms, the patient complains only of the following symptoms, and all other reviewed systems are negative.  Weight gain Numbness Joint swelling, back pain, walking difficulty, neck pain, neck stiffness Skin rash  Blood pressure 138/88, pulse 72, resp. rate 20, height 6' (1.829 m), weight 290 lb (131.5 kg).  Physical Exam  General: The patient is alert and cooperative at the time of the examination.  The patient is obese.  Skin: 3+ to 4+ edema is seen in the legs.   Neurologic Exam  Mental status: The patient is alert and oriented x 3 at the time of the examination. The patient has apparent normal recent and remote memory, with an apparently normal attention span and concentration ability.   Cranial nerves: Facial symmetry is present. Speech is normal, no aphasia or dysarthria is noted. Extraocular movements are full. Visual fields are full.  Motor: The patient has good strength in all 4 extremities.  Sensory examination: Soft touch sensation is symmetric on the face, arms, and legs.  Coordination: The patient has good finger-nose-finger and heel-to-shin bilaterally.  Gait and station: The patient has a wide-based gait, the patient walks with a cane, he has fairly good stability with this.  Reflexes: Deep tendon reflexes are symmetric, but are depressed.   Assessment/Plan:  1.  Cervical myelopathy  2.  Gait disorder  3.  Seizure disorder  The patient will continue his Keppra and phenobarbital, medication levels will be checked today, the patient was sent in a prescription for Keppra.  He will follow-up in 1 year.  Jill Alexanders MD 09/18/2018 1:34 PM  [facility] [address] [place], [place] [address]  Phone [phone] Fax [phone]

## 2018-09-19 ENCOUNTER — Telehealth: Payer: Self-pay | Admitting: *Deleted

## 2018-09-19 LAB — COMPREHENSIVE METABOLIC PANEL
ALT: 11 IU/L (ref 0–44)
AST: 22 IU/L (ref 0–40)
Albumin/Globulin Ratio: 1.5 (ref 1.2–2.2)
Albumin: 4.2 g/dL (ref 3.6–4.8)
Alkaline Phosphatase: 169 IU/L — ABNORMAL HIGH (ref 39–117)
BUN/Creatinine Ratio: 10 (ref 10–24)
BUN: 11 mg/dL (ref 8–27)
Bilirubin Total: 0.4 mg/dL (ref 0.0–1.2)
CALCIUM: 9.5 mg/dL (ref 8.6–10.2)
CO2: 28 mmol/L (ref 20–29)
CREATININE: 1.08 mg/dL (ref 0.76–1.27)
Chloride: 101 mmol/L (ref 96–106)
GFR calc Af Amer: 83 mL/min/{1.73_m2} (ref >59)
GFR, EST NON AFRICAN AMERICAN: 72 mL/min/{1.73_m2} (ref >59)
GLOBULIN, TOTAL: 2.8 g/dL (ref 1.5–4.5)
Glucose: 88 mg/dL (ref 65–99)
Potassium: 4.1 mmol/L (ref 3.5–5.2)
Sodium: 143 mmol/L (ref 134–144)
Total Protein: 7 g/dL (ref 6.0–8.5)

## 2018-09-19 LAB — PHENOBARBITAL LEVEL: Phenobarbital, Serum: 30 ug/mL (ref 15–40)

## 2018-09-19 NOTE — Telephone Encounter (Signed)
-----   Message from Kathrynn Ducking, MD sent at 09/19/2018 11:16 AM EDT ----- Phenobarbital level is therapeutic, comprehensive metabolic profile shows a chronic stable elevation of alkaline phosphatase, no clinical concern.  No change in phenobarbital dosing, please call the patient. ----- Message ----- From: Interface, [facility] Lab Results In Sent: 09/19/2018   7:39 AM EDT To: Kathrynn Ducking, MD

## 2018-09-19 NOTE — Telephone Encounter (Signed)
Spoke with pt. and reviewed below lab results. He verbalized understanding of same/fim 

## 2019-03-07 ENCOUNTER — Other Ambulatory Visit: Payer: Self-pay | Admitting: Neurology

## 2019-06-09 ENCOUNTER — Other Ambulatory Visit: Payer: Self-pay | Admitting: Family Medicine

## 2019-06-09 DIAGNOSIS — R9389 Abnormal findings on diagnostic imaging of other specified body structures: Secondary | ICD-10-CM

## 2019-06-28 ENCOUNTER — Ambulatory Visit
Admission: RE | Admit: 2019-06-28 | Discharge: 2019-06-28 | Disposition: A | Discharge status: Home / Self Care | Payer: Medicare Other | Source: Ambulatory Visit | Attending: Family Medicine | Admitting: Family Medicine

## 2019-06-28 ENCOUNTER — Other Ambulatory Visit: Payer: Self-pay

## 2019-06-28 DIAGNOSIS — R9389 Abnormal findings on diagnostic imaging of other specified body structures: Secondary | ICD-10-CM

## 2019-08-27 ENCOUNTER — Other Ambulatory Visit: Payer: Self-pay | Admitting: Physician Assistant

## 2019-08-30 ENCOUNTER — Other Ambulatory Visit: Payer: Self-pay | Admitting: Neurology

## 2019-09-26 ENCOUNTER — Emergency Department (HOSPITAL_COMMUNITY): Payer: Medicare Other

## 2019-09-26 ENCOUNTER — Telehealth: Payer: Self-pay | Admitting: *Deleted

## 2019-09-26 ENCOUNTER — Other Ambulatory Visit: Payer: Self-pay

## 2019-09-26 ENCOUNTER — Emergency Department (HOSPITAL_COMMUNITY)
Admission: EM | Admit: 2019-09-26 | Discharge: 2019-10-23 | Disposition: E | Payer: Medicare Other | Attending: Emergency Medicine | Admitting: Emergency Medicine

## 2019-09-26 ENCOUNTER — Encounter: Payer: Self-pay | Admitting: Adult Health

## 2019-09-26 ENCOUNTER — Ambulatory Visit: Payer: Medicare Other | Admitting: Adult Health

## 2019-09-26 VITALS — BP 152/81 | HR 79 | Temp 97.5°F | Ht 72.0 in | Wt 302.2 lb

## 2019-09-26 DIAGNOSIS — R569 Unspecified convulsions: Secondary | ICD-10-CM

## 2019-09-26 DIAGNOSIS — I110 Hypertensive heart disease with heart failure: Secondary | ICD-10-CM | POA: Insufficient documentation

## 2019-09-26 DIAGNOSIS — Z79899 Other long term (current) drug therapy: Secondary | ICD-10-CM | POA: Insufficient documentation

## 2019-09-26 DIAGNOSIS — Z20828 Contact with and (suspected) exposure to other viral communicable diseases: Secondary | ICD-10-CM | POA: Insufficient documentation

## 2019-09-26 DIAGNOSIS — Z91040 Latex allergy status: Secondary | ICD-10-CM | POA: Insufficient documentation

## 2019-09-26 DIAGNOSIS — R579 Shock, unspecified: Secondary | ICD-10-CM | POA: Insufficient documentation

## 2019-09-26 DIAGNOSIS — M5000 Cervical disc disorder with myelopathy, unspecified cervical region: Secondary | ICD-10-CM | POA: Insufficient documentation

## 2019-09-26 DIAGNOSIS — I5032 Chronic diastolic (congestive) heart failure: Secondary | ICD-10-CM | POA: Diagnosis not present

## 2019-09-26 DIAGNOSIS — I4901 Ventricular fibrillation: Secondary | ICD-10-CM

## 2019-09-26 DIAGNOSIS — E039 Hypothyroidism, unspecified: Secondary | ICD-10-CM | POA: Insufficient documentation

## 2019-09-26 DIAGNOSIS — I4892 Unspecified atrial flutter: Secondary | ICD-10-CM | POA: Diagnosis not present

## 2019-09-26 DIAGNOSIS — Z8669 Personal history of other diseases of the nervous system and sense organs: Secondary | ICD-10-CM | POA: Diagnosis not present

## 2019-09-26 DIAGNOSIS — Z7982 Long term (current) use of aspirin: Secondary | ICD-10-CM | POA: Insufficient documentation

## 2019-09-26 DIAGNOSIS — I469 Cardiac arrest, cause unspecified: Secondary | ICD-10-CM | POA: Insufficient documentation

## 2019-09-26 DIAGNOSIS — Z85828 Personal history of other malignant neoplasm of skin: Secondary | ICD-10-CM | POA: Diagnosis not present

## 2019-09-26 DIAGNOSIS — Z5181 Encounter for therapeutic drug level monitoring: Secondary | ICD-10-CM | POA: Diagnosis not present

## 2019-09-26 LAB — COMPREHENSIVE METABOLIC PANEL
ALT: 68 U/L — ABNORMAL HIGH (ref 0–44)
AST: 86 U/L — ABNORMAL HIGH (ref 15–41)
Albumin: 3.2 g/dL — ABNORMAL LOW (ref 3.5–5.0)
Alkaline Phosphatase: 159 U/L — ABNORMAL HIGH (ref 38–126)
Anion gap: 16 — ABNORMAL HIGH (ref 5–15)
BUN: 11 mg/dL (ref 8–23)
CO2: 23 mmol/L (ref 22–32)
Calcium: 8.5 mg/dL — ABNORMAL LOW (ref 8.9–10.3)
Chloride: 103 mmol/L (ref 98–111)
Creatinine, Ser: 1.64 mg/dL — ABNORMAL HIGH (ref 0.61–1.24)
GFR calc Af Amer: 50 mL/min — ABNORMAL LOW (ref >60)
GFR calc non Af Amer: 43 mL/min — ABNORMAL LOW (ref >60)
Glucose, Bld: 230 mg/dL — ABNORMAL HIGH (ref 70–99)
Potassium: 3.2 mmol/L — ABNORMAL LOW (ref 3.5–5.1)
Sodium: 142 mmol/L (ref 135–145)
Total Bilirubin: 0.5 mg/dL (ref 0.3–1.2)
Total Protein: 6.2 g/dL — ABNORMAL LOW (ref 6.5–8.1)

## 2019-09-26 LAB — CBC WITH DIFFERENTIAL/PLATELET
Abs Immature Granulocytes: 0.64 10*3/uL — ABNORMAL HIGH (ref 0.00–0.07)
Basophils Absolute: 0.1 10*3/uL (ref 0.0–0.1)
Basophils Relative: 1 %
Eosinophils Absolute: 0.1 10*3/uL (ref 0.0–0.5)
Eosinophils Relative: 1 %
HCT: 46.6 % (ref 39.0–52.0)
Hemoglobin: 14.2 g/dL (ref 13.0–17.0)
Immature Granulocytes: 5 %
Lymphocytes Relative: 32 %
Lymphs Abs: 4.4 10*3/uL — ABNORMAL HIGH (ref 0.7–4.0)
MCH: 30.1 pg (ref 26.0–34.0)
MCHC: 30.5 g/dL (ref 30.0–36.0)
MCV: 98.7 fL (ref 80.0–100.0)
Monocytes Absolute: 1.1 10*3/uL — ABNORMAL HIGH (ref 0.1–1.0)
Monocytes Relative: 8 %
Neutro Abs: 7.7 10*3/uL (ref 1.7–7.7)
Neutrophils Relative %: 53 %
Platelets: 169 10*3/uL (ref 150–400)
RBC: 4.72 MIL/uL (ref 4.22–5.81)
RDW: 13.6 % (ref 11.5–15.5)
WBC: 14 10*3/uL — ABNORMAL HIGH (ref 4.0–10.5)
nRBC: 0.1 % (ref 0.0–0.2)

## 2019-09-26 LAB — POCT I-STAT 7, (LYTES, BLD GAS, ICA,H+H)
Acid-base deficit: 2 mmol/L (ref 0.0–2.0)
Bicarbonate: 25.1 mmol/L (ref 20.0–28.0)
Calcium, Ion: 1.16 mmol/L (ref 1.15–1.40)
HCT: 42 % (ref 39.0–52.0)
Hemoglobin: 14.3 g/dL (ref 13.0–17.0)
O2 Saturation: 95 %
Patient temperature: 96.2
Potassium: 2.8 mmol/L — ABNORMAL LOW (ref 3.5–5.1)
Sodium: 142 mmol/L (ref 135–145)
TCO2: 27 mmol/L (ref 22–32)
pCO2 arterial: 47.5 mmHg (ref 32.0–48.0)
pH, Arterial: 7.324 — ABNORMAL LOW (ref 7.350–7.450)
pO2, Arterial: 77 mmHg — ABNORMAL LOW (ref 83.0–108.0)

## 2019-09-26 LAB — BRAIN NATRIURETIC PEPTIDE: B Natriuretic Peptide: 46.1 pg/mL (ref 0.0–100.0)

## 2019-09-26 LAB — TROPONIN I (HIGH SENSITIVITY): Troponin I (High Sensitivity): 52 ng/L — ABNORMAL HIGH (ref <18)

## 2019-09-26 LAB — SARS CORONAVIRUS 2 BY RT PCR (HOSPITAL ORDER, PERFORMED IN ~~LOC~~ HOSPITAL LAB): SARS Coronavirus 2: NEGATIVE

## 2019-09-26 MED ORDER — SODIUM CHLORIDE 0.9% FLUSH: 3.0000 mL | Freq: Once | INTRAVENOUS | Status: DC

## 2019-09-26 MED ORDER — FENTANYL BOLUS VIA INFUSION
50.0000 ug | INTRAVENOUS | Status: DC | PRN
  Filled 2019-09-26: qty 50

## 2019-09-26 MED ORDER — DIPHENHYDRAMINE HCL 50 MG/ML IJ SOLN: 25.0000 mg | INTRAMUSCULAR | Status: DC | PRN

## 2019-09-26 MED ORDER — ACETAMINOPHEN 325 MG PO TABS: 650.0000 mg | ORAL_TABLET | Freq: Four times a day (QID) | ORAL | Status: DC | PRN

## 2019-09-26 MED ORDER — NOREPINEPHRINE 4 MG/250ML-% IV SOLN
0.0000 ug/min | INTRAVENOUS | Status: DC
  Administered 2019-09-26: 17:00:00 10 ug/min via INTRAVENOUS

## 2019-09-26 MED ORDER — FENTANYL CITRATE (PF) 100 MCG/2ML IJ SOLN: 25.0000 ug | INTRAMUSCULAR | Status: DC | PRN

## 2019-09-26 MED ORDER — DEXTROSE 5 % IV SOLN: INTRAVENOUS | Status: DC

## 2019-09-26 MED ORDER — NOREPINEPHRINE BITARTRATE 1 MG/ML IV SOLN
INTRAVENOUS | Status: AC | PRN
  Administered 2019-09-26: 10 ug via INTRAVENOUS

## 2019-09-26 MED ORDER — GLYCOPYRROLATE 1 MG PO TABS
1.0000 mg | ORAL_TABLET | ORAL | Status: DC | PRN
  Filled 2019-09-26: qty 1

## 2019-09-26 MED ORDER — MIDAZOLAM HCL 2 MG/2ML IJ SOLN
2.0000 mg | INTRAMUSCULAR | Status: DC | PRN
  Administered 2019-09-26: 19:00:00 2 mg via INTRAVENOUS
  Filled 2019-09-26: qty 2

## 2019-09-26 MED ORDER — EPINEPHRINE 1 MG/10ML IJ SOSY
PREFILLED_SYRINGE | INTRAMUSCULAR | Status: AC | PRN
  Administered 2019-09-26 (×2): 1 mg via INTRAVENOUS

## 2019-09-26 MED ORDER — GLYCOPYRROLATE 0.2 MG/ML IJ SOLN: 0.2000 mg | INTRAMUSCULAR | Status: DC | PRN

## 2019-09-26 MED ORDER — EPINEPHRINE HCL 5 MG/250ML IV SOLN IN NS
0.5000 ug/min | INTRAVENOUS | Status: DC
  Administered 2019-09-26: 1 ug/min via INTRAVENOUS

## 2019-09-26 MED ORDER — POLYVINYL ALCOHOL 1.4 % OP SOLN
1.0000 [drp] | Freq: Four times a day (QID) | OPHTHALMIC | Status: DC | PRN
  Filled 2019-09-26: qty 15

## 2019-09-26 MED ORDER — ACETAMINOPHEN 650 MG RE SUPP: 650.0000 mg | Freq: Four times a day (QID) | RECTAL | Status: DC | PRN

## 2019-09-26 MED ORDER — GLYCOPYRROLATE 0.2 MG/ML IJ SOLN
0.2000 mg | INTRAMUSCULAR | Status: DC | PRN
  Administered 2019-09-26: 19:00:00 0.2 mg via INTRAVENOUS
  Filled 2019-09-26: qty 1

## 2019-09-26 MED ORDER — FENTANYL 2500MCG IN NS 250ML (10MCG/ML) PREMIX INFUSION
50.0000 ug/h | INTRAVENOUS | Status: DC
  Filled 2019-09-26: qty 250

## 2019-09-26 MED ORDER — FENTANYL CITRATE (PF) 100 MCG/2ML IJ SOLN
50.0000 ug | Freq: Once | INTRAMUSCULAR | Status: AC
  Administered 2019-09-26: 19:00:00 50 ug via INTRAVENOUS
  Filled 2019-09-26: qty 2

## 2019-09-26 MED ORDER — ATROPINE SULFATE 1 MG/ML IJ SOLN
INTRAMUSCULAR | Status: AC | PRN
  Administered 2019-09-26: 1 mg via INTRAVENOUS

## 2019-09-27 MED FILL — Medication: Qty: 1 | Status: AC

## 2019-10-02 ENCOUNTER — Telehealth: Payer: Self-pay | Admitting: Adult Health

## 2019-10-02 NOTE — Telephone Encounter (Signed)
I called and spoke to Barrett with [facility].  I relayed that we saw pt for seizures.  She asked about gait, and the pcp.  She will call them.

## 2019-10-02 NOTE — Telephone Encounter (Signed)
I was not seeing the patient for memory issues just seizures.

## 2019-10-02 NOTE — Telephone Encounter (Signed)
[email or web address] called stating pt signed up to donate his corneas.  Angie states she is aware that pt had memory loss and gait imbalance.  She is asking for a call to discuss what that was in relation to so they can determine if they can use pt's corneas.  Please call

## 2019-10-23 NOTE — Progress Notes (Signed)
I have read the note, and I agree with the clinical assessment and plan.  Charles K Willis   

## 2019-10-23 NOTE — Progress Notes (Signed)
As the patient was standing up to go to checkout he states that he felt dizzy.  Advised that he should sit back down.  He states that he felt that it was due to his mask.  He requested some water.  I stepped out of the room and asked the nurse to bring some water. When she brought the  water I checked his O2.  Heart rate was 95 but O2 was 70%.  The patient was alert and oriented.  The nurse went to grab another pulse oximetry to verify his oxygen level.  As we were trying to get a reading the patient began to seize and urinated on the floor. He did not convulse but was stiff in all 4 extremities. RN Jinny Blossom went to call 911.    The patient became unconscious and we Marin Olp and myself) could not  feel a pulse and he was not breathing.  I called for additional help.  We transferred the patient to the floor.  We began CPR at approximately 322.  We requested an AED.  AED was applied.  We continued CPR.  First shock from AED was at 1529.  CPR continued until EMS arrived and then they took over.  We did attempt to get an IV started while waiting on EMS but was not successful.

## 2019-10-23 NOTE — ED Provider Notes (Signed)
[facility] EMERGENCY DEPARTMENT Provider Note   CSN: [id number] Arrival date & time: 05-Oct-2019  1629     History   Chief Complaint Chief Complaint  Patient presents with  . Cardiac Arrest    HPI [name] is a 68 y.o. male.     HPI  This patient is a toxic appearing 68 year old male who presents in cardiac arrest from the neurology office, unfortunately he has a history of chronic cervical myelopathy, he lives by himself, he has known seizure disorder on both Keppra and phenobarbital.  He has a history of atrial flutter with rapid ventricular rate, he also has a history of being followed by cardiology for chronic peripheral edema.  He has no anticoagulations other than a baby aspirin.  The patient is unable to give any information because of a cardiac arrest.  He presents intubated with a King airway esophageal, he is not breathing spontaneously, he lost pulses in route to the hospital and required multiple rounds of epinephrine, amiodarone, multiple defibrillation events and arrives with pulses that are weak but present.  He has no other ability to communicate, the paramedics have no other answers as to the causation though this was a witnessed arrest in front of staff at the neurology office.  Approximately 30 minutes of downtime with continuous CPR until the paramedics arrived.  Past Medical History:  Diagnosis Date  . Abnormality of gait 08/17/2013  . Arthritis    left knee  . Asthma    "outgrew it from place I used to work at"  . Atrial flutter ([place])    with rapid conduction  . Cancer ([place])    skin ca to scalp, sees Dr. Tonia Brooms  . Cataract    bilateral  . Cervical myelopathy ([place])   . Cervical spondylosis with myelopathy 08/17/2013  . Chronic diastolic CHF (congestive heart failure) ([place])   . Gait disorder   . High cholesterol   . Hypertension   . Hypothyroidism   . Kidney stone    hx of  . Memory loss   . Paroxysmal atrial fibrillation  (HCC)    chads2vasc score is 1  . Peripheral edema   . Rash, skin    to left lower leg   . Seizures ([place]) 1966   "started as gran mal; lessened to no loss of consciousness nowl last ~ 2000" patient sees Dr. Floyde Parkins  . Sleep apnea, obstructive    uses cpap machine .9    Patient Active Problem List   Diagnosis Date Noted  . Sepsis ([place]) 06/15/2017  . Hypotension 06/15/2017  . Seizures ([place]) 06/15/2017  . HLD (hyperlipidemia) 06/15/2017  . PAF (paroxysmal atrial fibrillation) ([place]) 06/15/2017  . Acute pyelonephritis 06/15/2017  . Acute cystitis without hematuria   . AKI (acute kidney injury) ([place])   . Atrial fibrillation ([place]) 04/14/2016  . Arrhythmia 01/11/2016  . Chest pain 01/11/2016  . Normocytic anemia 05/01/2015  . Acute renal failure ([place]) 04/30/2015  . Diarrhea 04/30/2015  . Cervical spondylosis with myelopathy 08/17/2013  . Abnormality of gait 08/17/2013  . Chronic diastolic CHF (congestive heart failure) ([place]) 11/22/2011  . Dilantin toxicity 11/14/2011  . Dehydration 11/14/2011  . Dysarthria 11/14/2011  . Hypothermia 11/14/2011  . Hypothyroidism 11/14/2011  . HTN (hypertension), benign 11/14/2011  . Seizure disorder ([place]) 11/14/2011    Past Surgical History:  Procedure Laterality Date  . ANTERIOR CERVICAL DECOMP/DISCECTOMY FUSION  02/21/2012   Procedure: ANTERIOR CERVICAL DECOMPRESSION/DISCECTOMY FUSION 1 LEVEL;  Surgeon: Herbie Baltimore  Virgie Dad, MD;  Location: [facility];  Service: Neurosurgery;  Laterality: N/A;  Cervical Five-Six anterior cervical decompression with fusion plating and bonegraft  . CATARACT EXTRACTION W/ INTRAOCULAR LENS  IMPLANT, BILATERAL  1990's  . CERVICAL DISCECTOMY  03/2011  . EYE SURGERY  1996-1998   "right eye; 4 times total; for detached retina"  . RETINAL DETACHMENT SURGERY     x 4 right eye        Home Medications    Prior to Admission medications   Medication Sig Start Date End Date Taking? Authorizing Provider  aspirin EC  81 MG tablet Take 81 mg by mouth at bedtime.     [provider]  atorvastatin (LIPITOR) 40 MG tablet Take 40 mg by mouth every evening.      [provider]  diltiazem (TIAZAC) 360 MG 24 hr capsule Take 1 capsule (360 mg total) by mouth daily. 08/27/19   Baldwin Jamaica, PA-C  furosemide (LASIX) 40 MG tablet Take 40 mg by mouth daily. 06/13/18   [provider]  levETIRAcetam (KEPPRA) 500 MG tablet Take 1.5 tablets twice daily by mouth 09/18/18   Kathrynn Ducking, MD  levothyroxine (SYNTHROID, LEVOTHROID) 150 MCG tablet Take 300 mcg by mouth daily before breakfast.     [provider]  Multiple Vitamin (MULITIVITAMIN WITH MINERALS) TABS Take 1 tablet by mouth every morning.     [provider]  NYSTATIN powder Apply 100,000 Units topically daily. After shower 04/25/17   [provider]  PHENobarbital (LUMINAL) 32.4 MG tablet TAKE ONE TABLET AT BEDTIME. 08/30/19   Kathrynn Ducking, MD  PHENobarbital (LUMINAL) 64.8 MG tablet TAKE 2 TABLETS AT BEDTIME. 08/30/19   Kathrynn Ducking, MD  tamsulosin (FLOMAX) 0.4 MG CAPS capsule Take 0.4 mg by mouth daily. 09/08/19   [provider]    Family History Family History  Problem Relation Age of Onset  . Hypertension Mother   . Heart disease Mother 28  . Hypertension Father   . Glaucoma Father   . Sudden death Brother     Social History Social History   Tobacco Use  . Smoking status: Never Smoker  . Smokeless tobacco: Never Used  Substance Use Topics  . Alcohol use: No    Alcohol/week: 0.0 standard drinks  . Drug use: No     Allergies   Depakote [divalproex sodium] and Latex   Review of Systems Review of Systems  Unable to perform ROS: Patient unresponsive     Physical Exam Updated Vital Signs There were no vitals taken for this visit.  Physical Exam Constitutional:      Comments: GCS of 3, unresponsive, toxic appearing  HENT:     Head:     Comments: Atraumatic  head, dentition intact    Mouth/Throat:     Comments: Frothy sputum in the mouth frothy sputum in the mouth Eyes:     Comments: Right pupil is asymmetric with the left pupil, anisocoria, nonreactive to light bilaterally  Neck:     Comments: Significant kyphosis and rigidity Cardiovascular:     Comments: Irregular tachycardia, weak pulses at the central arteries Pulmonary:     Comments: No spontaneous breathing, respirations are assisted with bag on tube, rales present Abdominal:     Comments: Soft nontender abdomen  Musculoskeletal:     Comments: Intraosseous line present in the left humerus, bilateral severe edema, the patient is not able to fit into shoes, they are splayed open  Skin:  Comments: Pale  Neurological:     Comments: GCS of 3      ED Treatments / Results  Labs (all labs ordered are listed, but only abnormal results are displayed) Labs Reviewed  SARS CORONAVIRUS 2 BY RT PCR (HOSPITAL ORDER, [facility])  BASIC METABOLIC PANEL  CBC  TROPONIN I (HIGH SENSITIVITY)    EKG EKG Interpretation  Date/Time:  October 18, 2019 16:45:29 EST Ventricular Rate:  95 PR Interval:    QRS Duration: 113 QT Interval:  336 QTC Calculation: 423 R Axis:   68 Text Interpretation: Accelerated junctional rhythm Incomplete right bundle branch block ST depression, consider ischemia, diffuse lds since last tracing no significant change Confirmed by Noemi Chapel [phone]) on October 18, 2019 4:47:58 PM   Radiology Ct Head Wo Contrast  Result Date: 10-18-2019 CLINICAL DATA:  Cardiac arrest EXAM: CT HEAD WITHOUT CONTRAST TECHNIQUE: Contiguous axial images were obtained from the base of the skull through the vertex without intravenous contrast. COMPARISON:  CT brain 11/14/2011 FINDINGS: Brain: No acute intracranial hemorrhage or mass is visualized. Subtle effacement of the convexity sulci, most apparent at the cranial vertex. No midline shift. Stable  ventricle size. Vascular: No hyperdense vessels.  Carotid vascular calcification Skull: Normal. Negative for fracture or focal lesion. Fusion of the occipital condyles to C1. Sinuses/Orbits: Opacified right maxillary sinus with mucosal thickening in the right frontal and ethmoid sinuses. Right ocular band Other: None IMPRESSION: 1. Negative for acute intracranial hemorrhage or mass. 2. Suspicion of effacement of the convexity sulci as may be seen with edema and potential global hypoxic injury. Electronically Signed   By: Donavan Foil M.D.   On: 2019-10-18 17:50   Dg Chest Portable 1 View  Result Date: 18-Oct-2019 CLINICAL DATA:  Post CPR EXAM: PORTABLE CHEST 1 VIEW COMPARISON:  2018 FINDINGS: Endotracheal tube is approximately 2 cm from the carina. Enteric tube passes below the diaphragm with side port likely within the distal esophagus. Opacities are present in the right perihilar, right upper, and medial right lower lung zones. There is also retrocardiac opacity. Small left pleural effusion. No pneumothorax. Normal heart size. IMPRESSION: Lines and tubes as above. Bilateral opacities reflecting atelectasis or consolidation. Small left pleural effusion. Electronically Signed   By: Macy Mis M.D.   On: 2019-10-18 17:03    Procedures .Critical Care Performed by: Noemi Chapel, MD Authorized by: Noemi Chapel, MD   Critical care provider statement:    Critical care time (minutes):  35   Critical care time was exclusive of:  Separately billable procedures and treating other patients and teaching time   Critical care was necessary to treat or prevent imminent or life-threatening deterioration of the following conditions:  Cardiac failure   Critical care was time spent personally by me on the following activities:  Blood draw for specimens, development of treatment plan with patient or surrogate, discussions with consultants, evaluation of patient's response to treatment, examination of patient,  obtaining history from patient or surrogate, ordering and performing treatments and interventions, ordering and review of laboratory studies, ordering and review of radiographic studies, pulse oximetry, re-evaluation of patient's condition and review of old charts Procedure Name: Intubation Date/Time: 18-Oct-2019 4:48 PM Performed by: Noemi Chapel, MD Pre-anesthesia Checklist: Patient identified, Patient being monitored, Emergency Drugs available, Timeout performed and Suction available Oxygen Delivery Method: Non-rebreather mask Preoxygenation: Pre-oxygenation with 100% oxygen Ventilation: Mask ventilation without difficulty Laryngoscope Size: Mac and 4 Grade View: Grade III Tube size: 8.0 mm Number of  attempts: 1 Airway Equipment and Method: Stylet Placement Confirmation: ETT inserted through vocal cords under direct vision,  CO2 detector and Breath sounds checked- equal and bilateral Secured at: 24 cm Tube secured with: ETT holder Dental Injury: Teeth and Oropharynx as per pre-operative assessment  Difficulty Due To: Difficulty was anticipated Comments:      .Central Line  Date/Time: 2019-10-09 4:49 PM Performed by: Noemi Chapel, MD Authorized by: Noemi Chapel, MD   Consent:    Consent obtained:  Emergent situation Pre-procedure details:    Hand hygiene: Hand hygiene performed prior to insertion     Sterile barrier technique: All elements of maximal sterile technique followed     Skin preparation:  2% chlorhexidine   Skin preparation agent: Skin preparation agent completely dried prior to procedure   Procedure details:    Location:  R femoral   Patient position:  Flat   Catheter size:  7 Fr   Landmarks identified: yes     Ultrasound guidance: no     Number of attempts:  1   Successful placement: yes   Post-procedure details:    Post-procedure:  Dressing applied and line sutured   Assessment:  Blood return through all ports and free fluid flow   Patient tolerance of  procedure:  Tolerated well, no immediate complications Comments:        CPR:  I personally directed CPR, the patient normocephalic, atraumatic, clear oropharynx without any swelling, exudate, asymmetry, hypertrophy or erythema.  Moist mucous membranes, clear nasal passages, normal tympanic membranes without any effusions, erythema or opacification.  Approximately 5 minutes of continuous CPR with pulse checks lasting less than 5 seconds.  Return of spontaneous circulation with medications.  I directed CPR twice during the process of the intervention.   (including critical care time)  Medications Ordered in ED Medications  sodium chloride flush (NS) 0.9 % injection 3 mL (has no administration in time range)     Initial Impression / Assessment and Plan / ED Course  I have reviewed the triage vital signs and the nursing notes.  Pertinent labs & imaging results that were available during my care of the patient were reviewed by me and considered in my medical decision making (see chart for details).        The patient is toxic appearing, his EKG reveals an irregular rhythm  The patient has lost pulses while here, required epinephrine, atropine, I placed a central line and intubated the patient as well as directing CPR, please see the following procedure notes regarding the use interventions.  Unfortunately despite multiple resuscitative attempts the patient did not have any significant improvement, he continued to require Levophed, epinephrine and mechanical airway ventilation, there was no signs of life, no corneal reflex, I had a discussion with his family member who states that they would rather him to be comfort care at this point since he is not responding and had some any significant illnesses.  I consulted with pulmonary critical care who was able to see the patient in consultation, discussed with the family and advised the same.  The patient was removed from the ventilator and  expired a short time later at approximately 7:30 PM.  I have completed the death certificate, the patient appeared to die from natural causes secondary to significant advanced coronary disease and arrhythmia that led to cardiac arrest.  Final Clinical Impressions(s) / ED Diagnoses   Final diagnoses:  Cardiac arrest [facility])  Ventricular fibrillation ([place])      Sabra Heck,  Aaron Edelman, MD 2019/10/04 2246

## 2019-10-23 NOTE — Progress Notes (Signed)
PATIENT: [name] DOB: 04/14/1951  REASON FOR VISIT: follow up HISTORY FROM: patient  HISTORY OF PRESENT ILLNESS: Today 2019-10-16:  [name] is a 68 year old male with a history of seizures.  He returns today for follow-up.  He states that he has been taking Keppra he denies any seizure events.  Denies any significant changes in his gait or balance.He does not operate a motor vehicle. Overall he feels that he is doing well. Does mention that at times he may feel dizzy feels like this started when he started wearing a mask. Returns to for follow-up.   HISTORY (copied from Dr. Tobey Grim note)  [name] is a 68 year old right-handed white male with a history of a cervical myelopathy and a history of seizures.  The patient has had no seizures since last seen, he still has a residual gait disorder from his cervical myelopathy.  He lives alone, his wife passed away 4 or 5 years ago.  The patient has not had any falls, he uses a cane when he is outside of the house.  He is sleeping well, he denies significant issues controlling the bowels or the bladder.  He is on phenobarbital and Keppra, he tolerates these medications well.  REVIEW OF SYSTEMS: Out of a complete 14 system review of symptoms, the patient complains only of the following symptoms, and all other reviewed systems are negative.  dizziness  ALLERGIES: Allergies  Allergen Reactions  . Depakote [Divalproex Sodium]     Intolerant  . Latex Rash    Rash and mild hives    HOME MEDICATIONS: Outpatient Medications Prior to Visit  Medication Sig Dispense Refill  . aspirin EC 81 MG tablet Take 81 mg by mouth at bedtime.     Marland Kitchen atorvastatin (LIPITOR) 40 MG tablet Take 40 mg by mouth every evening.      . diltiazem (TIAZAC) 360 MG 24 hr capsule Take 1 capsule (360 mg total) by mouth daily. 30 capsule 0  . furosemide (LASIX) 40 MG tablet Take 40 mg by mouth daily.    Marland Kitchen levETIRAcetam (KEPPRA) 500 MG tablet Take 1.5 tablets  twice daily by mouth 270 tablet 3  . levothyroxine (SYNTHROID, LEVOTHROID) 150 MCG tablet Take 300 mcg by mouth daily before breakfast.     . Multiple Vitamin (MULITIVITAMIN WITH MINERALS) TABS Take 1 tablet by mouth every morning.     Marland Kitchen NYSTATIN powder Apply 100,000 Units topically daily. After shower    . PHENobarbital (LUMINAL) 32.4 MG tablet TAKE ONE TABLET AT BEDTIME. 90 tablet 1  . PHENobarbital (LUMINAL) 64.8 MG tablet TAKE 2 TABLETS AT BEDTIME. 180 tablet 1  . tamsulosin (FLOMAX) 0.4 MG CAPS capsule Take 0.4 mg by mouth daily.    . polyethylene glycol (MIRALAX / GLYCOLAX) packet Take 17 g by mouth daily. 14 each 0  . senna-docusate (SENOKOT-S) 8.6-50 MG tablet Take 1 tablet by mouth at bedtime. 30 tablet 0   No facility-administered medications prior to visit.     PAST MEDICAL HISTORY: Past Medical History:  Diagnosis Date  . Abnormality of gait 08/17/2013  . Arthritis    left knee  . Asthma    "outgrew it from place I used to work at"  . Atrial flutter ([place])    with rapid conduction  . Cancer ([place])    skin ca to scalp, sees Dr. Tonia Brooms  . Cataract    bilateral  . Cervical myelopathy ([place])   . Cervical spondylosis with myelopathy 08/17/2013  .  Chronic diastolic CHF (congestive heart failure) ([place])   . Gait disorder   . High cholesterol   . Hypertension   . Hypothyroidism   . Kidney stone    hx of  . Memory loss   . Paroxysmal atrial fibrillation (HCC)    chads2vasc score is 1  . Peripheral edema   . Rash, skin    to left lower leg   . Seizures ([place]) 1966   "started as gran mal; lessened to no loss of consciousness nowl last ~ 2000" patient sees Dr. Floyde Parkins  . Sleep apnea, obstructive    uses cpap machine .9    PAST SURGICAL HISTORY: Past Surgical History:  Procedure Laterality Date  . ANTERIOR CERVICAL DECOMP/DISCECTOMY FUSION  02/21/2012   Procedure: ANTERIOR CERVICAL DECOMPRESSION/DISCECTOMY FUSION 1 LEVEL;  Surgeon: Hosie Spangle, MD;  Location: [facility];  Service: Neurosurgery;  Laterality: N/A;  Cervical Five-Six anterior cervical decompression with fusion plating and bonegraft  . CATARACT EXTRACTION W/ INTRAOCULAR LENS  IMPLANT, BILATERAL  1990's  . CERVICAL DISCECTOMY  03/2011  . EYE SURGERY  1996-1998   "right eye; 4 times total; for detached retina"  . RETINAL DETACHMENT SURGERY     x 4 right eye    FAMILY HISTORY: Family History  Problem Relation Age of Onset  . Hypertension Mother   . Heart disease Mother 43  . Hypertension Father   . Glaucoma Father   . Sudden death Brother     SOCIAL HISTORY: Social History   Socioeconomic History  . Marital status: Married    Spouse name: Not on file  . Number of children: 0  . Years of education: Not on file  . Highest education level: Not on file  Occupational History  . 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 Financial resource strain: Not on file  . Food insecurity    Worry: Not on file    Inability: Not on file  . Transportation needs    Medical: Not on file    Non-medical: Not on file  Tobacco Use  . Smoking status: Never Smoker  . Smokeless tobacco: Never Used  Substance and Sexual Activity  . Alcohol use: No    Alcohol/week: 0.0 standard drinks  . Drug use: No  . Sexual activity: Not Currently  Lifestyle  . Physical activity    Days per week: Not on file    Minutes per session: Not on file  . Stress: Not on file  Relationships  . Social Herbalist on phone: Not on file    Gets together: Not on file    Attends religious service: Not on file    Active member of club or organization: Not on file    Attends meetings of clubs or organizations: Not on file    Relationship status: Not on file  . Intimate partner violence    Fear of current or ex partner: Not on file    Emotionally abused: Not on file    Physically abused: Not on file    Forced sexual activity: Not on file  Other Topics Concern   . Not on file  Social History Narrative  . Not on file      PHYSICAL EXAM  Vitals:   10-14-19 1449  BP: (!) 152/81  Pulse: 79  Temp: (!) 97.5 F (36.4 C)  Weight: (!) 302 lb 3.2 oz (137.1 kg)  Height: 6' (1.829 m)  Body mass index is 40.99 kg/m.  Generalized: Well developed, in no acute distress   Neurological examination  Mentation: Alert oriented to time, place, history taking. Follows all commands speech and language fluent Cranial nerve II-XII: Extraocular movements were full, visual field were full on confrontational test.  Head turning and shoulder shrug  were normal and symmetric. Motor: The motor testing reveals 5 over 5 strength of all 4 extremities. Good symmetric motor tone is noted throughout.  Sensory: Sensory testing is intact to soft touch on all 4 extremities. No evidence of extinction is noted.  Coordination: Cerebellar testing reveals good finger-nose-finger and heel-to-shin bilaterally.  Gait and station: patient uses a cane when ambulating. Reflexes: Deep tendon reflexes are symmetric and normal bilaterally.   DIAGNOSTIC DATA (LABS, IMAGING, TESTING) - I reviewed patient records, labs, notes, testing and imaging myself where available.  Lab Results  Component Value Date   WBC 5.6 09/05/2018   HGB 15.1 09/05/2018   HCT 44.9 09/05/2018   MCV 89 09/05/2018   PLT 224 09/05/2018      Component Value Date/Time   NA 143 09/18/2018 1403   K 4.1 09/18/2018 1403   CL 101 09/18/2018 1403   CO2 28 09/18/2018 1403   GLUCOSE 88 09/18/2018 1403   GLUCOSE 89 06/17/2017 0336   BUN 11 09/18/2018 1403   CREATININE 1.08 09/18/2018 1403   CALCIUM 9.5 09/18/2018 1403   PROT 7.0 09/18/2018 1403   ALBUMIN 4.2 09/18/2018 1403   AST 22 09/18/2018 1403   ALT 11 09/18/2018 1403   ALKPHOS 169 (H) 09/18/2018 1403   BILITOT 0.4 09/18/2018 1403   GFRNONAA 72 09/18/2018 1403   GFRAA 83 09/18/2018 1403   Lab Results  Component Value Date   TSH 0.043 (L) 01/11/2016       ASSESSMENT AND PLAN 68 y.o. year old male  has a past medical history of Abnormality of gait (08/17/2013), Arthritis, Asthma, Atrial flutter ([place]), Cancer ([place]), Cataract, Cervical myelopathy ([place]), Cervical spondylosis with myelopathy (08/17/2013), Chronic diastolic CHF (congestive heart failure) ([place]), Gait disorder, High cholesterol, Hypertension, Hypothyroidism, Kidney stone, Memory loss, Paroxysmal atrial fibrillation ([place]), Peripheral edema, Rash, skin, Seizures ([employer]) (1966), and Sleep apnea, obstructive. here with:  Seizures  - Continue Phenobarbital and Keppra - Blood work today  Patient remains stable.  Advised that he have any seizure events he should let us know.  He will follow-up in 6 months or sooner if needed.   I spent 15 minutes with the patient. 50% of this time was spent reviewing plan of care   Ward Givens, MSN, NP-C 10-01-2019, 3:01 PM [facility] [address] [place], [place] [address] [phone]

## 2019-10-23 NOTE — Telephone Encounter (Signed)
Patient collapsed in office. I was asked to contact his patient contact per demographics; no one was listed on DPR. I called sister-in-law Amie Critchley and notified her of his collapse and  that EMS has arrived; he'll be [facility] to [facility]. She will notify her husband, his brother, and she verbalized understanding, appreciation.

## 2019-10-23 NOTE — ED Notes (Signed)
RT came and extubated pt, pt immediately apneic.

## 2019-10-23 NOTE — Consult Note (Signed)
NAME:  [name], MRN:  [id number], DOB:  [birth year], LOS: 0 ADMISSION DATE:  2019-10-10, CONSULTATION DATE:  October 10, 2019 REFERRING MD:  Sabra Heck - EM, CHIEF COMPLAINT:  Post arrest   Brief History   68 yo M presents after seizure and subsequently multiple cardiac arrests, Vfib rhythm, for total down town > 30 minutes. Intubated in ED. Remains in shock   History of present illness   68 yo M PMH cervical myelopathy, seizure disorder (on phenobarbital and keppra), A flutter, HTN, HLD, OSA, peripheral edema who presents to ED from neurology office. At neurology office, patient became dizzy, then hypoxic to SpO2 70%, sustained seizure, and subsequently lost pulses. CPR was initiated for Vfib arrest. Ems dispatched. ROSC achieved after approximately 30 minutes. Upon arrival to ED, pulses lost in second Vfib arrest, ROSC achieved after approximately 6 minutes. Patient subsequently in SVT, cardioverted twice. Patient was intubated in ED; no sedation has been given to patient during ED course. Patient remains in shock, requiring pressors. CVC placed in ED.   CT Head is concerning for hypoxic injury with some effacement of convexity sulci.     PCCM physician has spoken with family who has made the patient's previously established care wishes known via AD; patient wishes are consistent for transition to comfort care.  Past Medical History  Cervical myelopathy  Chronic peripheral edema  Seizure disorder HTN HLD Asthma Atrial Flutter   Significant Hospital Events   11/4 presents from neurology office where he sustained a seizure and subsequent Vfib arrest. ROSC achieved after 30 minutes. In ED, second VFib arrest with ROSC after 6 minutes then SVT. Very poor neuro status with no sedation.   Consults:   Procedures:  11/4 CVC>> 11/4 ETT>>  Significant Diagnostic Tests:  CT H 11/4  Micro Data:  11/4 SARS CoV2> neg  Antimicrobials:    Interim history/subjective:  Patient is critically  ill, intubated, on pressors, with no sedation and very poor neurologic exam.   Objective   Blood pressure 95/61, pulse (!) 118, temperature (!) 95.2 F (35.1 C), resp. rate (!) 22, height 6' (1.829 m), weight (!) 137.1 kg, SpO2 93 %.    Vent Mode: PRVC FiO2 (%):  [100 %] 100 % Set Rate:  [22 bmp] 22 bmp Vt Set:  [580 mL-620 mL] 620 mL PEEP:  [5 cmH20] 5 cmH20 Plateau Pressure:  [18 cmH20] 18 cmH20  No intake or output data in the 24 hours ending 2019-10-10 1801 Filed Weights   10/10/2019 1704  Weight: (!) 137.1 kg    Examination: General: Critically and chronically ill appearing older adult M, intubated, no sedation, unresponsive  HENT: [place]. ETT present. R corneal injury. Injected sclera.  Lungs: Diminished lung sounds. No accessory muscle use. Cardiovascular: IRIR, distant heart sounds.  Abdomen: Obese, soft, round, ndnt Extremities: Very edematous BLE. No obvious joint deformity. Femoral CVC, LUE I/O Neuro: Asymmetric Pupils L 43mm R 59mm. Does not respond, withdraw, localize to painful stimulation GU: Foley catheter with scant amount of blood at urethral entrance.    Resolved Hospital Problem list     Assessment & Plan:   Cardiac arrest -2x Vfib arrest, downtime > 30 minutes -SVT Hypoxia, subsequent seizure -CT H concerning for early signs of hypoxic injury P PCCM physician has been able to reach family. Family has identified patients previously expressed wishes via AD. In congruence with previously expressed wishes on AD, a transition to comfort care will be made in the ED -DNR -Initiate fentanyl, versed -Dc  pressors -Extubate -No further imagine, labs, interventions not aimed at comfort    Best practice:  Diet: NPO Pain/Anxiety/Delirium protocol (if indicated): fentanyl versed  VAP protocol (if indicated): na DVT prophylaxis: na GI prophylaxis: na Glucose control: na Mobility: na Code Status: dNR  Family Communication: Close communication by ED MD and PCCM  Physician  Disposition: It is likely that patient will die in the ED after transition to comfort care. Please engage PCCM for admission if prolonged comfort care.   Labs   CBC: Recent Labs  Lab 10/09/2019 1643 2019/10/09 1721  WBC 14.0*  --   NEUTROABS 7.7  --   HGB 14.2 14.3  HCT 46.6 42.0  MCV 98.7  --   PLT 169  --     Basic Metabolic Panel: Recent Labs  Lab October 09, 2019 1643 2019/10/09 1721  NA 142 142  K 3.2* 2.8*  CL 103  --   CO2 23  --   GLUCOSE 230*  --   BUN 11  --   CREATININE 1.64*  --   CALCIUM 8.5*  --    GFR: Estimated Creatinine Clearance: 63.5 mL/min (A) (by C-G formula based on SCr of 1.64 mg/dL (H)). Recent Labs  Lab 09-Oct-2019 1643  WBC 14.0*    Liver Function Tests: Recent Labs  Lab 10-09-19 1643  AST 86*  ALT 68*  ALKPHOS 159*  BILITOT 0.5  PROT 6.2*  ALBUMIN 3.2*   No results for input(s): LIPASE, AMYLASE in the last 168 hours. No results for input(s): AMMONIA in the last 168 hours.  ABG    Component Value Date/Time   PHART 7.324 (L) 10/09/19 1721   PCO2ART 47.5 2019-10-09 1721   PO2ART 77.0 (L) 10/09/2019 1721   HCO3 25.1 2019/10/09 1721   TCO2 27 2019-10-09 1721   ACIDBASEDEF 2.0 2019-10-09 1721   O2SAT 95.0 10/09/2019 1721     Coagulation Profile: No results for input(s): INR, PROTIME in the last 168 hours.  Cardiac Enzymes: No results for input(s): CKTOTAL, CKMB, CKMBINDEX, TROPONINI in the last 168 hours.  HbA1C: No results found for: HGBA1C  CBG: No results for input(s): GLUCAP in the last 168 hours.  Review of Systems:   Unable to obtain, intubated, unresponsive   Past Medical History  He,  has a past medical history of Abnormality of gait (08/17/2013), Arthritis, Asthma, Atrial flutter ([place]), Cancer ([place]), Cataract, Cervical myelopathy ([place]), Cervical spondylosis with myelopathy (08/17/2013), Chronic diastolic CHF (congestive heart failure) ([place]), Gait disorder, High cholesterol, Hypertension, Hypothyroidism, Kidney  stone, Memory loss, Paroxysmal atrial fibrillation ([place]), Peripheral edema, Rash, skin, Seizures ([place]) (1966), and Sleep apnea, obstructive.   Surgical History    Past Surgical History:  Procedure Laterality Date  . ANTERIOR CERVICAL DECOMP/DISCECTOMY FUSION  02/21/2012   Procedure: ANTERIOR CERVICAL DECOMPRESSION/DISCECTOMY FUSION 1 LEVEL;  Surgeon: Hosie Spangle, MD;  Location: [facility];  Service: Neurosurgery;  Laterality: N/A;  Cervical Five-Six anterior cervical decompression with fusion plating and bonegraft  . CATARACT EXTRACTION W/ INTRAOCULAR LENS  IMPLANT, BILATERAL  1990's  . CERVICAL DISCECTOMY  03/2011  . EYE SURGERY  1996-1998   "right eye; 4 times total; for detached retina"  . RETINAL DETACHMENT SURGERY     x 4 right eye     Social History   reports that he has never smoked. He has never used smokeless tobacco. He reports that he does not drink alcohol or use drugs.   Family History   His family history includes Glaucoma in his  father; Heart disease (age of onset: 14) in his mother; Hypertension in his father and mother; Sudden death in his brother.   Allergies Allergies  Allergen Reactions  . Depakote [Divalproex Sodium]     Intolerant  . Latex Rash    Rash and mild hives     Home Medications  Prior to Admission medications   Medication Sig Start Date End Date Taking? Authorizing Provider  aspirin EC 81 MG tablet Take 81 mg by mouth at bedtime.     [provider]  atorvastatin (LIPITOR) 40 MG tablet Take 40 mg by mouth every evening.      [provider]  diltiazem (TIAZAC) 360 MG 24 hr capsule Take 1 capsule (360 mg total) by mouth daily. 08/27/19   Baldwin Jamaica, PA-C  furosemide (LASIX) 40 MG tablet Take 40 mg by mouth daily. 06/13/18   [provider]  levETIRAcetam (KEPPRA) 500 MG tablet Take 1.5 tablets twice daily by mouth 09/18/18   Kathrynn Ducking, MD  levothyroxine (SYNTHROID, LEVOTHROID) 150 MCG tablet Take 300  mcg by mouth daily before breakfast.     [provider]  Multiple Vitamin (MULITIVITAMIN WITH MINERALS) TABS Take 1 tablet by mouth every morning.     [provider]  NYSTATIN powder Apply 100,000 Units topically daily. After shower 04/25/17   [provider]  PHENobarbital (LUMINAL) 32.4 MG tablet TAKE ONE TABLET AT BEDTIME. 08/30/19   Kathrynn Ducking, MD  PHENobarbital (LUMINAL) 64.8 MG tablet TAKE 2 TABLETS AT BEDTIME. 08/30/19   Kathrynn Ducking, MD  tamsulosin (FLOMAX) 0.4 MG CAPS capsule Take 0.4 mg by mouth daily. 09/08/19   [provider]     Critical care time: 35 min     Eliseo Gum MSN, AGACNP-BC [place] [id number] If no answer, [phone] Oct 25, 2019, 6:33 PM

## 2019-10-23 NOTE — Procedures (Signed)
Extubation Procedure Note  Patient Details:   Name: [name] DOB: [birth year] MRN: [id number]   Airway Documentation:  Airway 8 mm (Active)  Secured at (cm) 25 cm Oct 10, 2019 1635  Measured From Lips 10-10-19 1635  Secured Location Right 2019-10-10 1635  Secured By [employer] 10/10/2019 1635  Cuff Pressure (cm H2O) 28 cm H2O 10/10/19 1635  Site Condition Dry 2019/10/10 1635   Vent end date: (not recorded) Vent end time: (not recorded)   Evaluation  O2 sats: stable throughout Complications: No apparent complications Patient did tolerate procedure well. Bilateral Breath Sounds: Absent   No  Extubated to terminal wean.  Levora Dredge 2019/10/10, 8:02 PM

## 2019-10-23 NOTE — Code Documentation (Signed)
No pulse 

## 2019-10-23 NOTE — ED Notes (Signed)
Time of death 41. Dr. Sabra Heck notified.

## 2019-10-23 NOTE — ED Notes (Signed)
Pt transported to [place]. This RN spoke with family regarding death. All pt belongings went w/ patient to morgue, eye prep complete.

## 2019-10-23 NOTE — ED Notes (Signed)
Critical care at bedside  

## 2019-10-23 NOTE — Progress Notes (Signed)
D/w family Mrs Trilby Drummer, sister in law of pt who has pt's advance directive. After d/w her the pt's clinical course, exam and imaging results we went over pt's advance directive.   He has designated that he would not want life prolonging support. He also states he would prefer comfort measures should he be in a condition that could be irreversible.   At this time we have initiated comfort measures.  Pt will be extubated in the ED and pressors stopped. He will be provided with comfort measures with fentanyl and versed should he require.   Family will not be visiting 2/2 the current pandemic and the risks involved with coming to the ED at this time however they have expressed great amount of gratitude for our care of their loved one.   RN will call family when pt expires.

## 2019-10-23 NOTE — Code Documentation (Signed)
Pulse present

## 2019-10-23 NOTE — ED Triage Notes (Signed)
PT arrived here via [facility] from neurologists office.  He had a seizure, his sats dropped to 75% and he lost pulses.  Pt was v-fib pulseless upon ems arrival.  Pt received 2 ashocks, 1 round amio, 6 epi (4 epis with return of pulses, 2 epis return of pulses).  After return of pulses pt went into svt at 200 and was cardioverted twice.  Pulses present upon arrival. No sedation used to intubate pt upon arrival.

## 2019-10-23 NOTE — Patient Instructions (Signed)
Continue Keppra and Phenobarbital  Blood work today If you have any seizure events please let us know.

## 2019-10-23 DEATH — deceased

## 2019-11-29 ENCOUNTER — Other Ambulatory Visit: Payer: Self-pay | Admitting: Neurology
